# Patient Record
Sex: Female | Born: 1970
Health system: Southern US, Community
[De-identification: ages and names within clinical notes are randomized; demographics above are authoritative.]

## PROBLEM LIST (undated history)

## (undated) DIAGNOSIS — D649 Anemia, unspecified: Secondary | ICD-10-CM

## (undated) DIAGNOSIS — K219 Gastro-esophageal reflux disease without esophagitis: Secondary | ICD-10-CM

## (undated) HISTORY — PX: OTHER SURGICAL HISTORY: SHX169

## (undated) HISTORY — DX: Gastro-esophageal reflux disease without esophagitis: K21.9

---

## 2001-08-21 ENCOUNTER — Emergency Department (HOSPITAL_COMMUNITY): Admission: EM | Admit: 2001-08-21 | Discharge: 2001-08-21 | Payer: Self-pay | Admitting: Emergency Medicine

## 2003-04-07 ENCOUNTER — Emergency Department (HOSPITAL_COMMUNITY): Admission: EM | Admit: 2003-04-07 | Discharge: 2003-04-07 | Payer: Self-pay | Admitting: Emergency Medicine

## 2011-07-03 ENCOUNTER — Other Ambulatory Visit: Payer: Self-pay | Admitting: Obstetrics

## 2013-02-12 ENCOUNTER — Encounter: Payer: Self-pay | Admitting: Obstetrics

## 2014-03-03 ENCOUNTER — Telehealth: Payer: Self-pay | Admitting: *Deleted
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
## 2014-03-04 NOTE — Telephone Encounter (Signed)
I appreciate the request but i cannot due to my limited availablity Thanks for asking

## 2014-03-04 NOTE — Telephone Encounter (Signed)
Tried to contact pt, was unsuccessful, will try again at a later time.

## 2014-03-05 NOTE — Telephone Encounter (Signed)
Unable to contact pt. 

## 2014-03-05 NOTE — Telephone Encounter (Signed)
Pt.notified

## 2014-03-09 ENCOUNTER — Ambulatory Visit (INDEPENDENT_AMBULATORY_CARE_PROVIDER_SITE_OTHER)
Admission: RE | Admit: 2014-03-09 | Discharge: 2014-03-09 | Disposition: A | Discharge status: Home / Self Care | Payer: BC Managed Care – PPO | Source: Ambulatory Visit | Attending: Internal Medicine | Admitting: Internal Medicine

## 2014-03-09 ENCOUNTER — Other Ambulatory Visit: Payer: Self-pay | Admitting: Internal Medicine

## 2014-03-09 ENCOUNTER — Encounter: Payer: Self-pay | Admitting: Internal Medicine

## 2014-03-09 ENCOUNTER — Ambulatory Visit (INDEPENDENT_AMBULATORY_CARE_PROVIDER_SITE_OTHER): Payer: BC Managed Care – PPO | Admitting: Internal Medicine

## 2014-03-09 VITALS — BP 104/76 | HR 63 | Temp 98.1°F | Resp 12 | Wt 143.8 lb

## 2014-03-09 DIAGNOSIS — M546 Pain in thoracic spine: Secondary | ICD-10-CM

## 2014-03-09 DIAGNOSIS — K219 Gastro-esophageal reflux disease without esophagitis: Secondary | ICD-10-CM | POA: Insufficient documentation

## 2014-03-09 NOTE — Progress Notes (Signed)
Pre visit review using our clinic review tool, if applicable. No additional management support is needed unless otherwise documented below in the visit note. 
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
## 2014-03-09 NOTE — Patient Instructions (Signed)
The Physical Therapy referral will be scheduled and you'll be notified of the time.

## 2014-06-26 ENCOUNTER — Other Ambulatory Visit (INDEPENDENT_AMBULATORY_CARE_PROVIDER_SITE_OTHER): Payer: BC Managed Care – PPO

## 2014-06-26 ENCOUNTER — Ambulatory Visit (INDEPENDENT_AMBULATORY_CARE_PROVIDER_SITE_OTHER): Payer: BC Managed Care – PPO | Admitting: Internal Medicine

## 2014-06-26 ENCOUNTER — Encounter: Payer: Self-pay | Admitting: Internal Medicine

## 2014-06-26 VITALS — BP 118/82 | HR 73 | Temp 98.4°F | Resp 14 | Ht 63.0 in | Wt 147.0 lb

## 2014-06-26 DIAGNOSIS — K219 Gastro-esophageal reflux disease without esophagitis: Secondary | ICD-10-CM | POA: Diagnosis not present

## 2014-06-26 DIAGNOSIS — Z0000 Encounter for general adult medical examination without abnormal findings: Secondary | ICD-10-CM

## 2014-06-26 LAB — LIPID PANEL
CHOLESTEROL: 208 mg/dL — AB (ref 0–200)
HDL: 67.1 mg/dL (ref >39.00)
LDL Cholesterol: 123 mg/dL — ABNORMAL HIGH (ref 0–99)
NonHDL: 140.9
Total CHOL/HDL Ratio: 3
Triglycerides: 88 mg/dL (ref 0.0–149.0)
VLDL: 17.6 mg/dL (ref 0.0–40.0)

## 2014-06-26 LAB — BASIC METABOLIC PANEL
BUN: 12 mg/dL (ref 6–23)
CALCIUM: 8.9 mg/dL (ref 8.4–10.5)
CO2: 26 meq/L (ref 19–32)
Chloride: 105 mEq/L (ref 96–112)
Creatinine, Ser: 0.9 mg/dL (ref 0.4–1.2)
GFR: 87.51 mL/min (ref >60.00)
GLUCOSE: 77 mg/dL (ref 70–99)
Potassium: 4.1 mEq/L (ref 3.5–5.1)
Sodium: 135 mEq/L (ref 135–145)

## 2014-06-26 MED ORDER — PANTOPRAZOLE SODIUM 40 MG PO TBEC: 40.0000 mg | DELAYED_RELEASE_TABLET | Freq: Every day | ORAL | Status: DC

## 2014-06-26 NOTE — Patient Instructions (Signed)
We will check your cholesterol today.   We want you to start taking an acid reflux medicine again once a day for about 6 weeks to see if the cough is coming from the acid reflux.  Gastroesophageal Reflux Disease, Adult Gastroesophageal reflux disease (GERD) happens when acid from your stomach flows up into the esophagus. When acid comes in contact with the esophagus, the acid causes soreness (inflammation) in the esophagus. Over time, GERD may create small holes (ulcers) in the lining of the esophagus. CAUSES   Increased body weight. This puts pressure on the stomach, making acid rise from the stomach into the esophagus.  Smoking. This increases acid production in the stomach.  Drinking alcohol. This causes decreased pressure in the lower esophageal sphincter (valve or ring of muscle between the esophagus and stomach), allowing acid from the stomach into the esophagus.  Late evening meals and a full stomach. This increases pressure and acid production in the stomach.  A malformed lower esophageal sphincter. Sometimes, no cause is found. SYMPTOMS   Burning pain in the lower part of the mid-chest behind the breastbone and in the mid-stomach area. This may occur twice a week or more often.  Trouble swallowing.  Sore throat.  Dry cough.  Asthma-like symptoms including chest tightness, shortness of breath, or wheezing. DIAGNOSIS  Your caregiver may be able to diagnose GERD based on your symptoms. In some cases, X-rays and other tests may be done to check for complications or to check the condition of your stomach and esophagus. TREATMENT  Your caregiver may recommend over-the-counter or prescription medicines to help decrease acid production. Ask your caregiver before starting or adding any new medicines.  HOME CARE INSTRUCTIONS   Change the factors that you can control. Ask your caregiver for guidance concerning weight loss, quitting smoking, and alcohol consumption.  Avoid foods and  drinks that make your symptoms worse, such as:  Caffeine or alcoholic drinks.  Chocolate.  Peppermint or mint flavorings.  Garlic and onions.  Spicy foods.  Citrus fruits, such as oranges, lemons, or limes.  Tomato-based foods such as sauce, chili, salsa, and pizza.  Fried and fatty foods.  Avoid lying down for the 3 hours prior to your bedtime or prior to taking a nap.  Eat small, frequent meals instead of large meals.  Wear loose-fitting clothing. Do not wear anything tight around your waist that causes pressure on your stomach.  Raise the head of your bed 6 to 8 inches with wood blocks to help you sleep. Extra pillows will not help.  Only take over-the-counter or prescription medicines for pain, discomfort, or fever as directed by your caregiver.  Do not take aspirin, ibuprofen, or other nonsteroidal anti-inflammatory drugs (NSAIDs). SEEK IMMEDIATE MEDICAL CARE IF:   You have pain in your arms, neck, jaw, teeth, or back.  Your pain increases or changes in intensity or duration.  You develop nausea, vomiting, or sweating (diaphoresis).  You develop shortness of breath, or you faint.  Your vomit is green, yellow, black, or looks like coffee grounds or blood.  Your stool is red, bloody, or black. These symptoms could be signs of other problems, such as heart disease, gastric bleeding, or esophageal bleeding. MAKE SURE YOU:   Understand these instructions.  Will watch your condition.  Will get help right away if you are not doing well or get worse. Document Released: 06/21/2005 Document Revised: 12/04/2011 Document Reviewed: 03/31/2011 [facility] Patient Information 2015 [place], [place]. This information is not intended to replace  advice given to you by your health care provider. Make sure you discuss any questions you have with your health care provider.  

## 2014-06-26 NOTE — Assessment & Plan Note (Signed)
Likely cause of her cough and will restart protonix.

## 2014-06-26 NOTE — Progress Notes (Signed)
Pre visit review using our clinic review tool, if applicable. No additional management support is needed unless otherwise documented below in the visit note. 

## 2014-06-26 NOTE — Assessment & Plan Note (Signed)
Spoke with her about not starting smoking, continuing to exercise, she declines the flu shot today, PAP smear up to date.

## 2014-06-26 NOTE — Progress Notes (Signed)
   Subjective:    Patient ID: Ebony Allen, female    DOB: 11-06-1970, 43 y.o.   MRN: [id number]  HPI The patient is a 43 YO female who is coming in today to establish care. She has PMH of GERD. She does take birth control. PMH, allergies, medications, history reviewed and updated. She is having a dry cough right now and wants to know if it may be cancer. No [place] of cancer except lung (in smoker) and prostate. She used to be on acid reflux medicine and tries to use diet to control it now. Denies chest pain, SOB, abdominal pain. She works out regularly. She does have some stress from work but nothing excessive.  Review of Systems  Constitutional: Negative for fever, activity change, appetite change and fatigue.  HENT: Negative.   Eyes: Negative.   Respiratory: Positive for cough. Negative for chest tightness, shortness of breath and wheezing.   Cardiovascular: Negative for chest pain, palpitations and leg swelling.  Gastrointestinal: Negative for abdominal pain, diarrhea, constipation and abdominal distention.  Genitourinary: Negative.   Musculoskeletal: Negative.   Skin: Negative.   Neurological: Negative.       Objective:   Physical Exam  Vitals reviewed. Constitutional: She is oriented to person, place, and time. She appears well-developed and well-nourished.  HENT:  Head: Normocephalic and atraumatic.  Eyes: EOM are normal.  Neck: Normal range of motion.  Cardiovascular: Normal rate and regular rhythm.   Pulmonary/Chest: Effort normal and breath sounds normal. No respiratory distress. She has no wheezes. She has no rales.  Abdominal: Soft. Bowel sounds are normal. She exhibits no distension. There is no tenderness. There is no rebound.  Neurological: She is alert and oriented to person, place, and time. Coordination normal.  Skin: Skin is warm and dry.   Filed Vitals:   06/26/14 1421  BP: 118/82  Pulse: 73  Temp: 98.4 F (36.9 C)  TempSrc: Oral  Resp: 14  Height: 5\' 3"  (1.6  m)  Weight: 147 lb (66.679 kg)  SpO2: 92%      Assessment & Plan:

## 2014-09-30 ENCOUNTER — Telehealth: Payer: Self-pay | Admitting: Internal Medicine

## 2014-09-30 NOTE — Telephone Encounter (Signed)
Rec'd from [facility] forward 5 pages to Dr. Doug Sou

## 2014-10-27 ENCOUNTER — Other Ambulatory Visit (HOSPITAL_COMMUNITY): Payer: Self-pay | Admitting: Obstetrics and Gynecology

## 2014-10-27 NOTE — H&P (Signed)
Ebony Allen is a 44 y.o. female P 0-0-1-0 for myomectomy because of menorrhagia, symptomatic uterine fibroids and severe anemia. For many years the patient has had heavy menstrual periods controlled by birth control pills.  For the past year,  however,  her menstrual flow has worsened.  Her flow lasts for 7 days requiring her to wear #2 tampons with #2 overnight pads and change them hourly.  In addition, she will experience cramping that she rates as a 9/10 on a 10 point pain scale but it will decrease to 5/10 with Ibuprofen 800 mg. She goes on to report intermenstrual bleeding that may range from spotting to  heavy bleeding that soils her clothes.  She will experience urinary urgency, frequency and lower  back pain  but denies problems with bowel function, vaginitis symptoms or dyspareunia.  A sono-hysterogram in December 2015 showed an intra-cavitary mass in the posterior mid-uterus measuring 4.5 x 4.3 x 4.8 cm with fluid outlining the border;  uterus measured: 11.2 x 9.59 x 6.37 cm with #6 intramural fibroids:  sub-mucosal-4.3 x 4.2 x 4.3 cm; right posterior-3.4 x 2.7 x 4.4 cm, left posterior-3.5 x 4.0 x 3.3 cm, left anterior-3.9 x 3.0 x 3.3 cm,  left anterior-2.8 x 2.5 x 2.6 cm and posterior left-2.6 x 2.4 x 2.4 cm.  Patient's hemoglobin/hematocrit at that same time was 6.4/20.0. For the past 3 months the patient had Ayestin 5 mg added to her birth control pill regimen with very little change in flow pattern and only a small decrease in flow  volume.  A review of both medical and surgical management options were given to the patient however, she wants to proceed with surgical managment of her fibroids.   In considering surgical management,  the patient opts for minimally invasive surgery and accepts the risks associated with uterine morcellation.   Past Medical History  OB History: G: 0;  P: 0-0-1-0  GYN History: menarche: 44 YO   LMP: 08/2014  Contracepton oral contraceptives (estrogen/progesterone)  The  patient denies history of sexually transmitted disease.  Denies history of abnormal PAP smear  Last PAP smear: 2014  Medical History: Anemia and GERD   Surgical History:  2013  Esophageal Dilatation Denies problems with anesthesia or history of blood transfusions  Family History: Myasthenia Gravis and Diabetes Mellitus  Social History: Single and employed as a Professor;  Denies tobacco use and occasionally uses alcohol   Medication:  Aygestin 5 mg  daily Integra 126mg /40 mg/3 mg  daily Pantoprazole 40 mg daily prn Vitamin D daily Zinc daily  No Known Allergies but Latex causes hives   Denies sensitivity to peanuts, shellfish, soy, or adhesives.   ROS: Admits to pruritic  left neck rash, cough related to reflux, urinary urgency and frequency but   denies headache, vision changes, nasal congestion, dysphagia, tinnitus, dizziness, hoarseness, chest pain, shortness of breath, nausea, vomiting, diarrhea,constipation, dysuria, hematuria, vaginitis symptoms,  swelling of joints,easy bruising,  myalgias, arthralgias, skin rashes, unexplained weight loss and except as is mentioned in the history of present illness, patient's review of systems is otherwise negative.  Physical Exam  Bp: 92/62      P: 64  R: 20  Temperature: 98.6 degrees F. orally   Weight: 150.5   Height: 5'  3"  BMI: 26.7  Neck: supple without masses or thyromegaly Lungs: clear to auscultation Heart: regular rate and rhythm Abdomen: firm mass from pelvis to 3 fingers above symphysis pubis;  non-tender and no organomegaly Pelvic:EGBUS- wnl;  vagina-normal rugae; uterus-enlarged to 12-14 weeks size, irregular, cervix without lesions or motion tenderness; adnexae-no tenderness or masses Extremities:  no clubbing, cyanosis or edema   Assesment: Symptomatic Uterine Fibroids                       Menorrhagia            History of Severe Anemia            Dysmenorrhea   Disposition:  Robotically-assisted myomectomy with  fibroid morcellation was reviewed with the patient along with hysteroscopic myomectomy using myosure, dilatation and curettage:  Benefits of the robotic approach include lesser postoperative pain, less blood loss during surgery, reduced risk of injury to other organs due to better visualization with a 3-D HD 10 times magnifying camera, shorter hospital stay between 0-1 night and rapid recovery with return to daily routine in 2-3 weeks. Although robotically-assisted procedures have a longer operative time than traditional laparotomy, in a patient with good medical history, the benefits usually outweigh the risks.   Risks include anesthetic complications, bleeding, infection, injury to other organs, need for an open abdominal incision, need for C-section should there be a breach in the endometrial cavity, and transient post-operative facial edema.  Patient informed about FDA warning on use of morcellator dated 01/09/2013.   Discussed:  1. Incidence of post-operative diagnosis of sarcoma in women undergoing a hysterectomy is 2:1000 2. Annual incidence of leiomyosarcoma is 0.64/100,000 women 3. Sarcomas have the highest incidence in women over 65 4. Power morcellation involves risks of spreading tissue / disease. In he case of undiagnosed cancer, it may adversely affect the patient's prognosis.  A Miralax Bowel Prep was given to the patient to be completed the day before her surgery. She verbalized understanding of the risks associated with her surgery as well as pre-operative instructions and has consented to proceed with Robot Assisted Laparoscopic Myomectomy with Fibroid Morcellation, Hysteroscopic Myomectomy with Myosure, Dilatation and Curettage at [place] on November 17, 2014 at 10:30 a.m. with Dr. Cletis Media.   CSN# [id number]   Taetum Flewellen J. Florene Glen, PA-C  for Dr. Dede Query. Rivard

## 2014-10-29 ENCOUNTER — Other Ambulatory Visit: Payer: Self-pay | Admitting: Obstetrics and Gynecology

## 2014-11-03 NOTE — Addendum Note (Signed)
Addended by: Delsa Bern on: 11/03/2014 04:32 PM   Modules accepted: Orders

## 2014-11-05 NOTE — Patient Instructions (Signed)
   Your procedure is scheduled on: Tuesday, Feb 23  Enter through the [employer] of [facility] at: 9 AM Pick up the phone at the desk and dial [phone] and inform us of your arrival.  Please call this number if you have any problems the morning of surgery: [phone]  Remember: Do not eat or drink after midnight: Monday Take these medicines the morning of surgery with a SIP OF WATER:  Do not wear jewelry, make-up, or FINGER nail polish No metal in your hair or on your body. Do not wear lotions, powders, perfumes.  You may wear deodorant.  Do not bring valuables to the hospital. Contacts, dentures or bridgework may not be worn into surgery.  Patients discharged on the day of surgery will not be allowed to drive home.

## 2014-11-09 ENCOUNTER — Inpatient Hospital Stay (HOSPITAL_COMMUNITY)
Admission: RE | Admit: 2014-11-09 | Discharge: 2014-11-09 | Disposition: A | Discharge status: Home / Self Care | Payer: Self-pay | Source: Ambulatory Visit

## 2014-11-13 ENCOUNTER — Encounter (HOSPITAL_COMMUNITY)
Admission: RE | Admit: 2014-11-13 | Discharge: 2014-11-13 | Disposition: A | Discharge status: Home / Self Care | Payer: BC Managed Care – PPO | Source: Ambulatory Visit | Attending: Obstetrics and Gynecology | Admitting: Obstetrics and Gynecology

## 2014-11-13 ENCOUNTER — Telehealth: Payer: Self-pay | Admitting: Internal Medicine

## 2014-11-13 ENCOUNTER — Encounter (HOSPITAL_COMMUNITY): Payer: Self-pay

## 2014-11-13 DIAGNOSIS — Z01818 Encounter for other preprocedural examination: Secondary | ICD-10-CM | POA: Insufficient documentation

## 2014-11-13 DIAGNOSIS — D259 Leiomyoma of uterus, unspecified: Secondary | ICD-10-CM | POA: Insufficient documentation

## 2014-11-13 HISTORY — DX: Anemia, unspecified: D64.9

## 2014-11-13 LAB — BASIC METABOLIC PANEL
Anion gap: 4 — ABNORMAL LOW (ref 5–15)
BUN: 14 mg/dL (ref 6–23)
CO2: 25 mmol/L (ref 19–32)
CREATININE: 1.03 mg/dL (ref 0.50–1.10)
Calcium: 8.5 mg/dL (ref 8.4–10.5)
Chloride: 111 mmol/L (ref 96–112)
GFR, EST AFRICAN AMERICAN: 75 mL/min — AB (ref >90)
GFR, EST NON AFRICAN AMERICAN: 65 mL/min — AB (ref >90)
Glucose, Bld: 91 mg/dL (ref 70–99)
Potassium: 3.7 mmol/L (ref 3.5–5.1)
Sodium: 140 mmol/L (ref 135–145)

## 2014-11-13 LAB — CBC
HCT: 43 % (ref 36.0–46.0)
Hemoglobin: 13.3 g/dL (ref 12.0–15.0)
MCH: 25.5 pg — ABNORMAL LOW (ref 26.0–34.0)
MCHC: 30.9 g/dL (ref 30.0–36.0)
MCV: 82.5 fL (ref 78.0–100.0)
Platelets: 318 K/uL (ref 150–400)
RBC: 5.21 MIL/uL — ABNORMAL HIGH (ref 3.87–5.11)
RDW: 16.6 % — ABNORMAL HIGH (ref 11.5–15.5)
WBC: 5 K/uL (ref 4.0–10.5)

## 2014-11-13 NOTE — Telephone Encounter (Signed)
Pt called in and is requesting refill on her Protinx .  She said that med is needing a PA from [employer]

## 2014-11-13 NOTE — Patient Instructions (Addendum)
   Your procedure is scheduled on: Tuesday, Feb 23  Enter through the [employer] of [facility] at: 9 AM Pick up the phone at the desk and dial [phone] and inform us of your arrival.  Please call this number if you have any problems the morning of surgery: [phone]  Remember: Do not eat or drink after midnight: Monday Take these medicines the morning of surgery with a SIP OF WATER: Protonix  Do not wear jewelry, make-up, or FINGER nail polish No metal in your hair or on your body. Do not wear lotions, powders, perfumes.  You may wear deodorant.  Do not bring valuables to the hospital. Contacts, dentures or bridgework may not be worn into surgery.    Patients discharged on the day of surgery will not be allowed to drive home.  Home with mother Ebony Allen cell [phone]

## 2014-11-16 ENCOUNTER — Other Ambulatory Visit (HOSPITAL_COMMUNITY): Payer: Self-pay | Admitting: Obstetrics and Gynecology

## 2014-11-16 MED ORDER — DEXTROSE 5 % IV SOLN
2.0000 g | INTRAVENOUS | Status: AC
  Administered 2014-11-17: 2 g via INTRAVENOUS
  Filled 2014-11-16: qty 2

## 2014-11-16 MED ORDER — MISOPROSTOL 200 MCG PO TABS
400.0000 ug | ORAL_TABLET | Freq: Once | ORAL | Status: DC
Start: 2014-11-17 — End: 2016-09-11
  Filled 2014-11-16: qty 2

## 2014-11-16 NOTE — Anesthesia Preprocedure Evaluation (Addendum)
Anesthesia Evaluation  Patient identified by MRN, date of birth, ID band Patient awake    Reviewed: Allergy & Precautions, NPO status , Patient's Chart, lab work & pertinent test results  History of Anesthesia Complications Negative for: history of anesthetic complications  Airway Mallampati: II  TM Distance: >3 FB Neck ROM: Full    Dental no notable dental hx. (+) Dental Advisory Given   Pulmonary neg pulmonary ROS,  breath sounds clear to auscultation  Pulmonary exam normal       Cardiovascular negative cardio ROS  Rhythm:Regular Rate:Normal     Neuro/Psych negative neurological ROS  negative psych ROS   GI/Hepatic Neg liver ROS, GERD-  Medicated and Controlled,  Endo/Other  negative endocrine ROS  Renal/GU negative Renal ROS  negative genitourinary   Musculoskeletal negative musculoskeletal ROS (+)   Abdominal   Peds negative pediatric ROS (+)  Hematology  (+) anemia ,   Anesthesia Other Findings   Reproductive/Obstetrics negative OB ROS                             Anesthesia Physical Anesthesia Plan  ASA: II  Anesthesia Plan: General   Post-op Pain Management:    Induction: Intravenous  Airway Management Planned: Oral ETT  Additional Equipment:   Intra-op Plan:   Post-operative Plan: Extubation in OR  Informed Consent: I have reviewed the patients History and Physical, chart, labs and discussed the procedure including the risks, benefits and alternatives for the proposed anesthesia with the patient or authorized representative who has indicated his/her understanding and acceptance.   Dental advisory given  Plan Discussed with: CRNA  Anesthesia Plan Comments:        Anesthesia Quick Evaluation

## 2014-11-17 ENCOUNTER — Ambulatory Visit (HOSPITAL_COMMUNITY): Payer: BC Managed Care – PPO | Admitting: Anesthesiology

## 2014-11-17 ENCOUNTER — Ambulatory Visit (HOSPITAL_COMMUNITY)
Admission: RE | Admit: 2014-11-17 | Discharge: 2014-11-17 | Disposition: A | Discharge status: Home / Self Care | Payer: BC Managed Care – PPO | Source: Ambulatory Visit | Attending: Obstetrics and Gynecology | Admitting: Obstetrics and Gynecology

## 2014-11-17 ENCOUNTER — Encounter (HOSPITAL_COMMUNITY)
Admission: RE | Disposition: A | Discharge status: Home / Self Care | Payer: Self-pay | Source: Ambulatory Visit | Attending: Obstetrics and Gynecology

## 2014-11-17 DIAGNOSIS — D251 Intramural leiomyoma of uterus: Secondary | ICD-10-CM | POA: Insufficient documentation

## 2014-11-17 DIAGNOSIS — D250 Submucous leiomyoma of uterus: Secondary | ICD-10-CM | POA: Insufficient documentation

## 2014-11-17 DIAGNOSIS — N920 Excessive and frequent menstruation with regular cycle: Secondary | ICD-10-CM | POA: Diagnosis present

## 2014-11-17 DIAGNOSIS — D649 Anemia, unspecified: Secondary | ICD-10-CM | POA: Diagnosis not present

## 2014-11-17 DIAGNOSIS — D259 Leiomyoma of uterus, unspecified: Secondary | ICD-10-CM

## 2014-11-17 DIAGNOSIS — K219 Gastro-esophageal reflux disease without esophagitis: Secondary | ICD-10-CM | POA: Insufficient documentation

## 2014-11-17 DIAGNOSIS — N946 Dysmenorrhea, unspecified: Secondary | ICD-10-CM | POA: Diagnosis not present

## 2014-11-17 DIAGNOSIS — D219 Benign neoplasm of connective and other soft tissue, unspecified: Secondary | ICD-10-CM | POA: Diagnosis present

## 2014-11-17 HISTORY — PX: DILATATION & CURETTAGE/HYSTEROSCOPY WITH MYOSURE: SHX6511

## 2014-11-17 HISTORY — PX: ROBOT ASSISTED MYOMECTOMY: SHX5142

## 2014-11-17 LAB — BASIC METABOLIC PANEL
Anion gap: 7 (ref 5–15)
BUN: 8 mg/dL (ref 6–23)
CO2: 17 mmol/L — ABNORMAL LOW (ref 19–32)
Calcium: 7.1 mg/dL — ABNORMAL LOW (ref 8.4–10.5)
Chloride: 111 mmol/L (ref 96–112)
Creatinine, Ser: 1.02 mg/dL (ref 0.50–1.10)
GFR, EST AFRICAN AMERICAN: 76 mL/min — AB (ref >90)
GFR, EST NON AFRICAN AMERICAN: 66 mL/min — AB (ref >90)
Glucose, Bld: 126 mg/dL — ABNORMAL HIGH (ref 70–99)
Potassium: 4.3 mmol/L (ref 3.5–5.1)
Sodium: 135 mmol/L (ref 135–145)

## 2014-11-17 LAB — TYPE AND SCREEN
ABO/RH(D): O POS
Antibody Screen: NEGATIVE

## 2014-11-17 LAB — PREGNANCY, URINE: PREG TEST UR: NEGATIVE

## 2014-11-17 LAB — ABO/RH: ABO/RH(D): O POS

## 2014-11-17 SURGERY — DILATATION & CURETTAGE/HYSTEROSCOPY WITH MYOSURE
Anesthesia: General | Site: Uterus

## 2014-11-17 MED ORDER — VASOPRESSIN 20 UNIT/ML IV SOLN
INTRAVENOUS | Status: AC
  Filled 2014-11-17: qty 1

## 2014-11-17 MED ORDER — MISOPROSTOL 200 MCG PO TABS
400.0000 ug | ORAL_TABLET | Freq: Once | ORAL | Status: AC
  Administered 2014-11-17: 400 ug via VAGINAL

## 2014-11-17 MED ORDER — ROPIVACAINE HCL 5 MG/ML IJ SOLN
INTRAMUSCULAR | Status: AC
  Filled 2014-11-17: qty 60

## 2014-11-17 MED ORDER — SCOPOLAMINE 1 MG/3DAYS TD PT72
1.0000 | MEDICATED_PATCH | Freq: Once | TRANSDERMAL | Status: DC
  Administered 2014-11-17: 1.5 mg via TRANSDERMAL

## 2014-11-17 MED ORDER — METHYLENE BLUE 1 % INJ SOLN
INTRAMUSCULAR | Status: AC
  Filled 2014-11-17: qty 1

## 2014-11-17 MED ORDER — SODIUM CHLORIDE 0.9 % IJ SOLN
INTRAMUSCULAR | Status: AC
  Filled 2014-11-17: qty 100

## 2014-11-17 MED ORDER — DEXAMETHASONE SODIUM PHOSPHATE 10 MG/ML IJ SOLN
INTRAMUSCULAR | Status: AC
  Filled 2014-11-17: qty 1

## 2014-11-17 MED ORDER — MIDAZOLAM HCL 2 MG/2ML IJ SOLN
INTRAMUSCULAR | Status: DC | PRN
  Administered 2014-11-17: 0.5 mg via INTRAVENOUS
  Administered 2014-11-17: 1.5 mg via INTRAVENOUS

## 2014-11-17 MED ORDER — LIDOCAINE HCL (CARDIAC) 20 MG/ML IV SOLN
INTRAVENOUS | Status: AC
Start: 2014-11-17 — End: 2014-11-17
  Filled 2014-11-17: qty 5

## 2014-11-17 MED ORDER — ACETAMINOPHEN 10 MG/ML IV SOLN
1000.0000 mg | Freq: Once | INTRAVENOUS | Status: AC
  Administered 2014-11-17: 1000 mg via INTRAVENOUS
  Filled 2014-11-17 (×2): qty 100

## 2014-11-17 MED ORDER — SCOPOLAMINE 1 MG/3DAYS TD PT72
MEDICATED_PATCH | TRANSDERMAL | Status: AC
  Filled 2014-11-17: qty 1

## 2014-11-17 MED ORDER — LACTATED RINGERS IV SOLN
INTRAVENOUS | Status: DC
  Administered 2014-11-17: 09:00:00 via INTRAVENOUS

## 2014-11-17 MED ORDER — PROPOFOL 10 MG/ML IV BOLUS
INTRAVENOUS | Status: DC | PRN
  Administered 2014-11-17: 150 mg via INTRAVENOUS

## 2014-11-17 MED ORDER — IBUPROFEN 600 MG PO TABS
600.0000 mg | ORAL_TABLET | Freq: Four times a day (QID) | ORAL | Status: DC | PRN
  Administered 2014-11-17: 600 mg via ORAL
  Filled 2014-11-17: qty 1

## 2014-11-17 MED ORDER — LACTATED RINGERS IR SOLN
Status: DC | PRN
  Administered 2014-11-17: 3000 mL

## 2014-11-17 MED ORDER — KETOROLAC TROMETHAMINE 30 MG/ML IJ SOLN
INTRAMUSCULAR | Status: AC
  Filled 2014-11-17: qty 1

## 2014-11-17 MED ORDER — PROPOFOL 10 MG/ML IV BOLUS
INTRAVENOUS | Status: AC
  Filled 2014-11-17: qty 20

## 2014-11-17 MED ORDER — BUPIVACAINE HCL (PF) 0.25 % IJ SOLN
INTRAMUSCULAR | Status: AC
  Filled 2014-11-17: qty 30

## 2014-11-17 MED ORDER — FENTANYL CITRATE 0.05 MG/ML IJ SOLN
INTRAMUSCULAR | Status: DC | PRN
  Administered 2014-11-17 (×6): 50 ug via INTRAVENOUS

## 2014-11-17 MED ORDER — OXYCODONE-ACETAMINOPHEN 5-325 MG PO TABS
1.0000 | ORAL_TABLET | ORAL | Status: DC | PRN
  Administered 2014-11-17: 1 via ORAL
  Filled 2014-11-17: qty 1

## 2014-11-17 MED ORDER — SODIUM CHLORIDE 0.9 % IJ SOLN
INTRAMUSCULAR | Status: AC
  Filled 2014-11-17: qty 10

## 2014-11-17 MED ORDER — ROCURONIUM BROMIDE 100 MG/10ML IV SOLN
INTRAVENOUS | Status: AC
  Filled 2014-11-17: qty 1

## 2014-11-17 MED ORDER — LIDOCAINE HCL (CARDIAC) 20 MG/ML IV SOLN
INTRAVENOUS | Status: DC | PRN
  Administered 2014-11-17: 60 mg via INTRAVENOUS

## 2014-11-17 MED ORDER — IBUPROFEN 600 MG PO TABS
ORAL_TABLET | ORAL | Status: DC
Start: 2014-11-17 — End: 2017-04-05

## 2014-11-17 MED ORDER — ROCURONIUM BROMIDE 100 MG/10ML IV SOLN
INTRAVENOUS | Status: DC | PRN
  Administered 2014-11-17: 10 mg via INTRAVENOUS
  Administered 2014-11-17: 20 mg via INTRAVENOUS
  Administered 2014-11-17: 50 mg via INTRAVENOUS
  Administered 2014-11-17 (×2): 10 mg via INTRAVENOUS

## 2014-11-17 MED ORDER — ONDANSETRON HCL 4 MG/2ML IJ SOLN
INTRAMUSCULAR | Status: DC | PRN
  Administered 2014-11-17 (×2): 2 mg via INTRAVENOUS

## 2014-11-17 MED ORDER — ARTIFICIAL TEARS OP OINT
TOPICAL_OINTMENT | OPHTHALMIC | Status: AC
  Filled 2014-11-17: qty 3.5

## 2014-11-17 MED ORDER — OXYCODONE-ACETAMINOPHEN 5-325 MG PO TABS: 1.0000 | ORAL_TABLET | Freq: Four times a day (QID) | ORAL | Status: DC | PRN

## 2014-11-17 MED ORDER — ONDANSETRON HCL 4 MG PO TABS: 4.0000 mg | ORAL_TABLET | Freq: Three times a day (TID) | ORAL | Status: DC | PRN

## 2014-11-17 MED ORDER — STERILE WATER FOR IRRIGATION IR SOLN
Status: DC | PRN
  Administered 2014-11-17: 3000 mL

## 2014-11-17 MED ORDER — ONDANSETRON HCL 4 MG/2ML IJ SOLN
INTRAMUSCULAR | Status: AC
  Filled 2014-11-17: qty 2

## 2014-11-17 MED ORDER — SODIUM CHLORIDE 0.9 % IJ SOLN
INTRAMUSCULAR | Status: AC
  Filled 2014-11-17: qty 50

## 2014-11-17 MED ORDER — ARTIFICIAL TEARS OP OINT
TOPICAL_OINTMENT | OPHTHALMIC | Status: DC | PRN
  Administered 2014-11-17: 1 via OPHTHALMIC

## 2014-11-17 MED ORDER — CHLOROPROCAINE HCL 1 % IJ SOLN
INTRAMUSCULAR | Status: AC
  Filled 2014-11-17: qty 30

## 2014-11-17 MED ORDER — FENTANYL CITRATE 0.05 MG/ML IJ SOLN: 25.0000 ug | INTRAMUSCULAR | Status: DC | PRN

## 2014-11-17 MED ORDER — LACTATED RINGERS IV SOLN
INTRAVENOUS | Status: DC
  Administered 2014-11-17: 21:00:00 via INTRAVENOUS

## 2014-11-17 MED ORDER — NEOSTIGMINE METHYLSULFATE 10 MG/10ML IV SOLN
INTRAVENOUS | Status: DC | PRN
  Administered 2014-11-17: 4 mg via INTRAVENOUS

## 2014-11-17 MED ORDER — LACTATED RINGERS IV SOLN
INTRAVENOUS | Status: DC
  Administered 2014-11-17 (×2): via INTRAVENOUS

## 2014-11-17 MED ORDER — FENTANYL CITRATE 0.05 MG/ML IJ SOLN
INTRAMUSCULAR | Status: AC
  Filled 2014-11-17: qty 5

## 2014-11-17 MED ORDER — MENTHOL 3 MG MT LOZG: 1.0000 | LOZENGE | OROMUCOSAL | Status: DC | PRN

## 2014-11-17 MED ORDER — FENTANYL CITRATE 0.05 MG/ML IJ SOLN
INTRAMUSCULAR | Status: AC
  Filled 2014-11-17: qty 2

## 2014-11-17 MED ORDER — ONDANSETRON HCL 4 MG/2ML IJ SOLN: 4.0000 mg | Freq: Once | INTRAMUSCULAR | Status: DC | PRN

## 2014-11-17 MED ORDER — HYDROMORPHONE HCL 1 MG/ML IJ SOLN
INTRAMUSCULAR | Status: DC | PRN
  Administered 2014-11-17: 1 mg via INTRAVENOUS

## 2014-11-17 MED ORDER — HYDROMORPHONE HCL 1 MG/ML IJ SOLN
INTRAMUSCULAR | Status: AC
  Filled 2014-11-17: qty 1

## 2014-11-17 MED ORDER — SODIUM CHLORIDE 0.9 % IV SOLN
INTRAVENOUS | Status: DC | PRN
  Administered 2014-11-17: 90 mL

## 2014-11-17 MED ORDER — MIDAZOLAM HCL 2 MG/2ML IJ SOLN
INTRAMUSCULAR | Status: AC
  Filled 2014-11-17: qty 2

## 2014-11-17 MED ORDER — DEXAMETHASONE SODIUM PHOSPHATE 10 MG/ML IJ SOLN
INTRAMUSCULAR | Status: DC | PRN
  Administered 2014-11-17: 10 mg via INTRAVENOUS

## 2014-11-17 MED ORDER — VASOPRESSIN 20 UNIT/ML IV SOLN
INTRAVENOUS | Status: DC | PRN
  Administered 2014-11-17: 50 mL via INTRAMUSCULAR
  Administered 2014-11-17: 55 mL via INTRAMUSCULAR

## 2014-11-17 MED ORDER — ACETAMINOPHEN 10 MG/ML IV SOLN
1000.0000 mg | Freq: Once | INTRAVENOUS | Status: AC
  Administered 2014-11-17: 1000 mg via INTRAVENOUS
  Filled 2014-11-17: qty 100

## 2014-11-17 MED ORDER — GLYCOPYRROLATE 0.2 MG/ML IJ SOLN
INTRAMUSCULAR | Status: DC | PRN
  Administered 2014-11-17: .6 mg via INTRAVENOUS
  Administered 2014-11-17 (×2): 0.1 mg via INTRAVENOUS

## 2014-11-17 MED ORDER — NEOSTIGMINE METHYLSULFATE 10 MG/10ML IV SOLN
INTRAVENOUS | Status: AC
  Filled 2014-11-17: qty 1

## 2014-11-17 MED ORDER — GLYCOPYRROLATE 0.2 MG/ML IJ SOLN
INTRAMUSCULAR | Status: AC
  Filled 2014-11-17: qty 3

## 2014-11-17 SURGICAL SUPPLY — 68 items
BARRIER ADHS 3X4 INTERCEED (GAUZE/BANDAGES/DRESSINGS) ×4 IMPLANT
BLADE LAP MORCELLATOR 15X9.5 (ELECTROSURGICAL) ×1 IMPLANT
BOOTIES KNEE HIGH SLOAN (MISCELLANEOUS) ×5 IMPLANT
BRR ADH 4X3 ABS CNTRL BYND (GAUZE/BANDAGES/DRESSINGS) ×4
CANISTER SUCT 3000ML (MISCELLANEOUS) ×6 IMPLANT
CATH FOLEY LATEX FREE 14FR (CATHETERS) ×3
CATH FOLEY LF 14FR (CATHETERS) IMPLANT
CHLORAPREP W/TINT 26ML (MISCELLANEOUS) ×3 IMPLANT
CLOTH BEACON ORANGE TIMEOUT ST (SAFETY) ×3 IMPLANT
CONT PATH 16OZ SNAP LID 3702 (MISCELLANEOUS) ×3 IMPLANT
CONTAINER PREFILL 10% NBF 60ML (FORM) ×5 IMPLANT
COVER BACK TABLE 60X90IN (DRAPES) ×6 IMPLANT
COVER TIP SHEARS 8 DVNC (MISCELLANEOUS) ×2 IMPLANT
COVER TIP SHEARS 8MM DA VINCI (MISCELLANEOUS) ×1
DECANTER SPIKE VIAL GLASS SM (MISCELLANEOUS) ×10 IMPLANT
DRAPE WARM FLUID 44X44 (DRAPE) ×3 IMPLANT
DRSG COVADERM PLUS 2X2 (GAUZE/BANDAGES/DRESSINGS) ×12 IMPLANT
DRSG OPSITE POSTOP 3X4 (GAUZE/BANDAGES/DRESSINGS) ×3 IMPLANT
ELECT REM PT RETURN 9FT ADLT (ELECTROSURGICAL) ×3
ELECTRODE REM PT RTRN 9FT ADLT (ELECTROSURGICAL) ×2 IMPLANT
GLOVE BIOGEL PI IND STRL 7.0 (GLOVE) ×4 IMPLANT
GLOVE BIOGEL PI INDICATOR 7.0 (GLOVE) ×8
GLOVE ECLIPSE 6.5 STRL STRAW (GLOVE) ×10 IMPLANT
GLOVE ECLIPSE 7.0 STRL STRAW (GLOVE) ×1 IMPLANT
GLOVE SURG SS PI 7.0 STRL IVOR (GLOVE) ×1 IMPLANT
GOWN STRL REUS W/TWL LRG LVL3 (GOWN DISPOSABLE) ×6 IMPLANT
KIT ACCESSORY DA VINCI DISP (KITS) ×1
KIT ACCESSORY DVNC DISP (KITS) ×2 IMPLANT
LIQUID BAND (GAUZE/BANDAGES/DRESSINGS) ×3 IMPLANT
MYOSURE XL FIBROID REM (MISCELLANEOUS) ×6
NDL SPNL 22GX7 QUINCKE BK (NEEDLE) IMPLANT
NEEDLE HYPO 22GX1.5 SAFETY (NEEDLE) ×3 IMPLANT
NEEDLE SPNL 22GX7 QUINCKE BK (NEEDLE) ×3 IMPLANT
NS IRRIG 1000ML POUR BTL (IV SOLUTION) ×6 IMPLANT
PACK ROBOT WH (CUSTOM PROCEDURE TRAY) ×3 IMPLANT
PACK ROBOTIC GOWN (GOWN DISPOSABLE) ×3 IMPLANT
PACK VAGINAL MINOR WOMEN LF (CUSTOM PROCEDURE TRAY) ×2 IMPLANT
PAD OB MATERNITY 4.3X12.25 (PERSONAL CARE ITEMS) ×3 IMPLANT
PAD POSITIONER PINK NONSTERILE (MISCELLANEOUS) ×3 IMPLANT
PORT ACCESS TROCAR AIRSEAL 12 (TROCAR) ×2 IMPLANT
PORT ACCESS TROCAR AIRSEAL 5M (TROCAR) ×1
SET BI-LUMEN FLTR TB AIRSEAL (TUBING) ×1 IMPLANT
SET CYSTO W/LG BORE CLAMP LF (SET/KITS/TRAYS/PACK) IMPLANT
SET IRRIG TUBING LAPAROSCOPIC (IRRIGATION / IRRIGATOR) ×3 IMPLANT
SET TRI-LUMEN FLTR TB AIRSEAL (TUBING) ×1 IMPLANT
SUT MNCRL AB 3-0 PS2 27 (SUTURE) ×6 IMPLANT
SUT VIC AB 0 CT1 27 (SUTURE)
SUT VIC AB 0 CT1 27XBRD ANTBC (SUTURE) IMPLANT
SUT VICRYL 0 UR6 27IN ABS (SUTURE) ×6 IMPLANT
SUT VLOC 180 0 6IN GS21 (SUTURE) ×1 IMPLANT
SUT VLOC 180 0 9IN  GS21 (SUTURE) ×4
SUT VLOC 180 0 9IN GS21 (SUTURE) ×2 IMPLANT
SUT VLOC 180 2-0 6IN GS21 (SUTURE) ×4 IMPLANT
SUT VLOC 180 2-0 9IN GS21 (SUTURE) ×6 IMPLANT
SYR 50ML LL SCALE MARK (SYRINGE) ×4 IMPLANT
SYSTEM CONVERTIBLE TROCAR (TROCAR) ×1 IMPLANT
SYSTEM TISS REMOVAL MYSR XL RM (MISCELLANEOUS) IMPLANT
TIP UTERINE 6.7X8CM BLUE DISP (MISCELLANEOUS) ×1 IMPLANT
TOWEL OR 17X24 6PK STRL BLUE (TOWEL DISPOSABLE) ×9 IMPLANT
TRAY FOLEY BAG SILVER LF 16FR (CATHETERS) ×3 IMPLANT
TROCAR 12M 150ML BLUNT (TROCAR) ×1 IMPLANT
TROCAR OPTI TIP 12M 100M (ENDOMECHANICALS) ×1 IMPLANT
TROCAR PORT AIRSEAL 5X120 (TROCAR) IMPLANT
TROCAR PORT AIRSEAL 8X120 (TROCAR) ×1 IMPLANT
TROCAR XCEL 12X100 BLDLESS (ENDOMECHANICALS) ×3 IMPLANT
TUBING AQUILEX INFLOW (TUBING) ×3 IMPLANT
TUBING AQUILEX OUTFLOW (TUBING) ×3 IMPLANT
WATER STERILE IRR 1000ML POUR (IV SOLUTION) ×5 IMPLANT

## 2014-11-17 NOTE — Interval H&P Note (Signed)
History and Physical Interval Note:  11/17/2014 10:30 AM  Ebony Allen  has presented today for surgery, with the diagnosis of Symtomatic Fibroids  The various methods of treatment have been discussed with the patient and family. After consideration of risks, benefits and other options for treatment, the patient has consented to  Procedure(s): [place] (N/A) ROBOTIC ASSISTED MYOMECTOMY (N/A) as a surgical intervention .  The patient's history has been reviewed, patient examined, no change in status, stable for surgery.  I have reviewed the patient's chart and labs.  Questions were answered to the patient's satisfaction.     Marbin Olshefski A

## 2014-11-17 NOTE — Progress Notes (Signed)
Discharge instructions reviewed and prescription medications. Patient expresses understanding. No further questions at this time, patient wheeled downstairs by nurse tech to vehicle and discharged home

## 2014-11-17 NOTE — Discharge Summary (Signed)
Physician Discharge Summary  Patient ID: Ebony Allen MRN: [id number] DOB/AGE: August 06, 1971 44 y.o.  Admit date: 11/17/2014 Discharge date: 11/17/2014   Discharge Diagnoses:  Symptomatic Uterine Fibroids Active Problems:   * No active hospital problems. *   Operation: Hysteroscopic Myomectomy with Myosure, Robot Assisted Laparoscopic Myomectomy with Morcellation   Discharged Condition: Good  Hospital Course: On the date of admission the patient underwent the aforementioned procedures and tolerated them well.  Post operative course was unremarkable with the patient resuming bowel and bladder function on the evening following her surgery and was therefore deemed ready for discharge home.  Disposition: Home to self care  Discharge Medications:    Medication List    STOP taking these medications        norethindrone 5 MG tablet  Commonly known as:  AYGESTIN      TAKE these medications        FISH OIL PO  Take 2 capsules by mouth daily.     HAIR/SKIN/NAILS PO  Take 1 tablet by mouth daily.     ibuprofen 600 MG tablet  Commonly known as:  ADVIL,MOTRIN  1  po  pc with 6 ounces of water every 6 hours for 5 days then prn-pain     INTEGRA PO  Take 1 tablet by mouth daily.     ondansetron 4 MG tablet  Commonly known as:  ZOFRAN  Take 1 tablet (4 mg total) by mouth every 8 (eight) hours as needed for nausea or vomiting.     oxyCODONE-acetaminophen 5-325 MG per tablet  Commonly known as:  PERCOCET/ROXICET  Take 1-2 tablets by mouth every 6 (six) hours as needed for severe pain.     pantoprazole 40 MG tablet  Commonly known as:  PROTONIX  Take 1 tablet (40 mg total) by mouth daily.     Vitamin D 2000 UNITS Caps  Take 1 capsule by mouth daily.         Follow-up: Dr. Katharine Look A. Rivard on December 01, 2014 at 10:45 a.m.  SignedEarnstine Regal, Hershal Coria 11/17/2014, 6:36 PM

## 2014-11-17 NOTE — Anesthesia Postprocedure Evaluation (Signed)
  Anesthesia Post-op Note  Patient: Ebony Allen  Procedure(s) Performed: Procedure(s) (LRB): DILATATION & CURETTAGE/HYSTEROSCOPY WITH MYOSURE (N/A) ROBOTIC ASSISTED MYOMECTOMY (N/A)  Patient Location: PACU  Anesthesia Type: General  Level of Consciousness: awake and alert   Airway and Oxygen Therapy: Patient Spontanous Breathing  Post-op Pain: mild  Post-op Assessment: Post-op Vital signs reviewed, Patient's Cardiovascular Status Stable, Respiratory Function Stable, Patent Airway and No signs of Nausea or vomiting  Last Vitals:  Filed Vitals:   11/17/14 2127  BP: 142/69  Pulse: 65  Temp: 36.7 C  Resp: 16    Post-op Vital Signs: stable   Complications: No apparent anesthesia complications

## 2014-11-17 NOTE — Discharge Instructions (Signed)
Call [facility] @ [phone] if:  You have a temperature greater than or equal to 100.4 degrees Farenheit orally You have pain that is not made better by the pain medication given and taken as directed You have excessive bleeding or problems urinating  Take Colace (Docusate Sodium/Stool Softener) 100 mg 2-3 times daily while taking narcotic pain medicine to avoid constipation or until bowel movements are regular. Take Ibuprofen 600mg  with food and at least 6 ounces of water every 6 hours for 5 days then as needed for pain Continue daily Aygestin until your post-op appointment with Dr Cletis Media.  You may drive after 1 week You may walk up steps You may shower tomorrow  You may resume a regular diet Keep incisions clean and dry  Do not lift over 15 pounds for 6 weeks Avoid anything in vagina for 6 weeks (or until after your post-operative visit)

## 2014-11-17 NOTE — Transfer of Care (Signed)
Immediate Anesthesia Transfer of Care Note  Patient: Ebony Allen  Procedure(s) Performed: Procedure(s): DILATATION & CURETTAGE/HYSTEROSCOPY WITH MYOSURE (N/A) ROBOTIC ASSISTED MYOMECTOMY (N/A)  Patient Location: PACU  Anesthesia Type:General  Level of Consciousness: awake, alert  and oriented  Airway & Oxygen Therapy: Patient Spontanous Breathing and Patient connected to nasal cannula oxygen  Post-op Assessment: Report given to RN and Post -op Vital signs reviewed and stable  Post vital signs: Reviewed and stable  Last Vitals:  Filed Vitals:   11/17/14 0850  BP: 105/84  Temp: 36.6 C  Resp: 18    Complications: No apparent anesthesia complications

## 2014-11-17 NOTE — Op Note (Signed)
Preoperative diagnosis: Symptomatic uterine fibroids   Postoperative diagnosis: Same   Anesthesia: General   Anesthesiologist: Dr. Jillyn Hidden  Procedure: Hysteroscopy with Myosure myomectomy followed by Robotically assisted myomectomy   Surgeon: Dr. Katharine Look Ayleen Mckinstry   Assistant: Earnstine Regal P.A.-C .  Estimated blood loss: 350 cc   Water deficit: 3800 cc  Procedure:   After being informed of the planned procedure with possible complications including but not limited to bleeding, infection, injury to other organs, need for laparotomy, possible need for morcellation with risks and benefits reviewed, expected hospital stay and recovery, informed consent is obtained and patient is taken to or #7. She is placed in lithotomy position on a Pink pad with both arms padded and tucked on each side and bilateral knee-high sequential compressive devices. She is given general anesthesia with endotracheal intubation without any complication. She is prepped and draped in a sterile fashion. A  Foley catheter is inserted in her bladder.   Pelvic exam reveals: An enlarged uterus at 14 weeks, irregular in shape both mobile. Adnexa are not felt  A weighted speculum is inserted in the vagina and the anterior lip of the cervix is grasped with a tenaculum forcep. We proceed with a paracervical block and vaginal infiltration using ropivacaine 0.5% diluted 1 in 1 with saline. We then infiltrate at 12,3,6 and 9 o'clock using Vasopressin 20 Units in 100 cc of saline ( total of 4 Units given). The uterus was then sounded at 9 cm. We easily dilate the cervix using Hegar dilator to #21 which allows for easy insertion of the Myosure diagnostic hysteroscope. With perfusion of Normal saline  at a maximum pressure of 120 mmHg, we are able to evaluate the entire uterine cavity.  Observation: A large fibroid fills the entire uterine cavity and has a large 4 cm posterior base. We are able to see the left tubal ostium but not the right  one. We also note behind this fibroid a 3 cm fundal submucosal fibroid and a 2 cm submucosal fibroid, both on the right side.   We insert the Myosure device  and proceed with the resection of 80 % of the large base but had to terminate the procedure due to a water deficit of approximately 3800 cc and a significant drop in the patient's core temperature which rapidly returned to normal after termination of the excision.. We then removed our instrumentation. Using a fibroid grasper, we attempt to grab the remaining body of the fibroid but were unsuccessful.  Instruments are then removed. Instrument and sponge count is complete x2. Estimated blood loss is 200 cc. Water deficit is 3800 cc of normal saline. A #8 RUMI intrauterine manipulator is inserted in the uterine cavity. Due to active bleeding, We also insert a Foley catheter in the uterus and insufflate the balloon with 30 cc of Saline for tamponade.  Trocar placement is decided. We infiltrate 6 cm above the umbilicus with 10 cc of ropivacaine per protocol and perform a 10 mm vertical incision which is brought down bluntly to the fascia. The fascia is identified and grasped with Coker forceps. The fascia is incised with Mayo scissors. Peritoneum is entered bluntly. A pursestring suture of 0 Vicryl is placed on the fascia and a 10 mm Hassan trocar is easily inserted in the abdominal cavity held in placed with a Pursetring suture. This allows for easy insufflation of a pneumoperitoneum using warmed CO2 at a maximum pressure of 15 mm of mercury. 60 cc of Ropivacaine 0.5 % diluted  1 in 1 is sent in the pelvis and the patient is positioned in reverse Trendelenburg. We then placed two 65mm robotic trocar on the left, one 43mm robotic trocar on the right and one 12 mm Airseal patient's side assistant trocar on the right after infiltrating every site with ropivacaine per protocol. The robot is docked on the left of the patient after positioning her in Trendelenburg. A  monopolar scissor is inserted in arm #1, a PK gyrus forcep is inserted in arm #2 and a Tenaculum is inserted in arm #3.  Preparation and docking is completed in 43 minutes.   Observation: The uterus is enlarged with multiple fibroids with a posterior dominant one measuring 4 cm. Both tubes and ovaries are normal. Both anterior and posterior cul-de-sac are normal. Liver and gallbladder are  visualized and normal. Appendix is visualized and normal.  We begin on the posterior wall and after infiltrating each time with Vasopressin 20 Units/100 ml of Saline, we perform a right posterior 3 cm incision to remove a 3 cm fibroid. A left posterior 3 cm incision allows [place] to remove a 3 cm fibroid. A 5 cm right posterior fundal incision leads to the removal of 4 fibroids totaling 5 cm. All incisions are closed with 2 layers of running sutures of 0 V-Lock and a baseball suture of 2-0 V-Lock for satisfactory hemostasis. Moving to the anterior aspect of the uterus, we proceed in the same manner. A left anterior 4 cm incision allows [place] to remove a 4 cm intramural and a 1 cm intramural fibroids with entry in the uterine cavity. This allows [place] to grab the large submucosal fibroid that had been partially resected hysteroscopically and remove it. A 3.5 cm right fundal incision allows the removal of a 4 cm intramural fibroid with entry in the uterine cavity which then allows [place] to remove the last 2 submucosal fibroids measuring 2 and 3 cm. We close these 2 incisions in 2 layers of running 0 V-Lock with care not to leave suture material in the cavity and then complete the closure with baseball sutures using 2-0 V-Lock for satisfactory hemostasis. Finally a 2 cm right anterior incision lets [place] remove a 2 cm subserosal fibroid. This last incision is closed with 1 layer of running 2-0 V-Lock and a baseball suture of 2-0 V-Lock.   A total of 14 fibroids were removed with 6 incisions and 2 entries in the uterine cavity.  We irrigated  profusely to confirm a satisfactory hemostasis. A sheet of Interceed divided in 2 covers all incisions. Console  time is completed in 4 hours and 28 minutes.   All instruments are then removed and the robot is undocked.   The patient side assistant 12 mm trocar is replaced with the morcellator under direct visualization. We then proceed with systematic morcellation of the specimen which takes 15 minutes.   We irrigated profusely to again confirm a satisfactory hemostasis.   All trochars are removed under direct visualization after evacuating the pneumoperitoneum.   The fascia of the supraumbilical incision is closed with the previously placed pursestring suture of 0 Vicryl. the fascia of the right patient side assistant trocar is closed with a figure-of-eight stitch of 0 Vicryl. All incisions are then closed with subcuticular suture of 3-0 Monocryl and Dermabond.   A speculum is inserted in the vagina to confirm adequate hemostasis on the cervix and absence of heavy uterine bleeding after removal of the RUMI and the uterine foley catheter.  Instrument and  sponge count is complete x2. Estimated blood loss is 350 cc.   The procedure is well tolerated by the patient who  is taken to recovery room in a well and stable condition.   Specimen: Fibroids weighing 80 g sent to pathology.

## 2014-11-17 NOTE — H&P (View-Only) (Signed)
Ebony Allen is a 44 y.o. female P 0-0-1-0 for myomectomy because of menorrhagia, symptomatic uterine fibroids and severe anemia. For many years the patient has had heavy menstrual periods controlled by birth control pills.  For the past year,  however,  her menstrual flow has worsened.  Her flow lasts for 7 days requiring her to wear #2 tampons with #2 overnight pads and change them hourly.  In addition, she will experience cramping that she rates as a 9/10 on a 10 point pain scale but it will decrease to 5/10 with Ibuprofen 800 mg. She goes on to report intermenstrual bleeding that may range from spotting to  heavy bleeding that soils her clothes.  She will experience urinary urgency, frequency and lower  back pain  but denies problems with bowel function, vaginitis symptoms or dyspareunia.  A sono-hysterogram in December 2015 showed an intra-cavitary mass in the posterior mid-uterus measuring 4.5 x 4.3 x 4.8 cm with fluid outlining the border;  uterus measured: 11.2 x 9.59 x 6.37 cm with #6 intramural fibroids:  sub-mucosal-4.3 x 4.2 x 4.3 cm; right posterior-3.4 x 2.7 x 4.4 cm, left posterior-3.5 x 4.0 x 3.3 cm, left anterior-3.9 x 3.0 x 3.3 cm,  left anterior-2.8 x 2.5 x 2.6 cm and posterior left-2.6 x 2.4 x 2.4 cm.  Patient's hemoglobin/hematocrit at that same time was 6.4/20.0. For the past 3 months the patient had Ayestin 5 mg added to her birth control pill regimen with very little change in flow pattern and only a small decrease in flow  volume.  A review of both medical and surgical management options were given to the patient however, she wants to proceed with surgical managment of her fibroids.   In considering surgical management,  the patient opts for minimally invasive surgery and accepts the risks associated with uterine morcellation.   Past Medical History  OB History: G: 0;  P: 0-0-1-0  GYN History: menarche: 44 YO   LMP: 08/2014  Contracepton oral contraceptives (estrogen/progesterone)  The  patient denies history of sexually transmitted disease.  Denies history of abnormal PAP smear  Last PAP smear: 2014  Medical History: Anemia and GERD   Surgical History:  2013  Esophageal Dilatation Denies problems with anesthesia or history of blood transfusions  Family History: Myasthenia Gravis and Diabetes Mellitus  Social History: Single and employed as a Professor;  Denies tobacco use and occasionally uses alcohol   Medication:  Aygestin 5 mg  daily Integra 126mg /40 mg/3 mg  daily Pantoprazole 40 mg daily prn Vitamin D daily Zinc daily  No Known Allergies but Latex causes hives   Denies sensitivity to peanuts, shellfish, soy, or adhesives.   ROS: Admits to pruritic  left neck rash, cough related to reflux, urinary urgency and frequency but   denies headache, vision changes, nasal congestion, dysphagia, tinnitus, dizziness, hoarseness, chest pain, shortness of breath, nausea, vomiting, diarrhea,constipation, dysuria, hematuria, vaginitis symptoms,  swelling of joints,easy bruising,  myalgias, arthralgias, skin rashes, unexplained weight loss and except as is mentioned in the history of present illness, patient's review of systems is otherwise negative.  Physical Exam  Bp: 92/62      P: 64  R: 20  Temperature: 98.6 degrees F. orally   Weight: 150.5   Height: 5'  3"  BMI: 26.7  Neck: supple without masses or thyromegaly Lungs: clear to auscultation Heart: regular rate and rhythm Abdomen: firm mass from pelvis to 3 fingers above symphysis pubis;  non-tender and no organomegaly Pelvic:EGBUS- wnl;  vagina-normal rugae; uterus-enlarged to 12-14 weeks size, irregular, cervix without lesions or motion tenderness; adnexae-no tenderness or masses Extremities:  no clubbing, cyanosis or edema   Assesment: Symptomatic Uterine Fibroids                       Menorrhagia            History of Severe Anemia            Dysmenorrhea   Disposition:  Robotically-assisted myomectomy with  fibroid morcellation was reviewed with the patient along with hysteroscopic myomectomy using myosure, dilatation and curettage:  Benefits of the robotic approach include lesser postoperative pain, less blood loss during surgery, reduced risk of injury to other organs due to better visualization with a 3-D HD 10 times magnifying camera, shorter hospital stay between 0-1 night and rapid recovery with return to daily routine in 2-3 weeks. Although robotically-assisted procedures have a longer operative time than traditional laparotomy, in a patient with good medical history, the benefits usually outweigh the risks.   Risks include anesthetic complications, bleeding, infection, injury to other organs, need for an open abdominal incision, need for C-section should there be a breach in the endometrial cavity, and transient post-operative facial edema.  Patient informed about FDA warning on use of morcellator dated 01/09/2013.   Discussed:  1. Incidence of post-operative diagnosis of sarcoma in women undergoing a hysterectomy is 2:1000 2. Annual incidence of leiomyosarcoma is 0.64/100,000 women 3. Sarcomas have the highest incidence in women over 65 4. Power morcellation involves risks of spreading tissue / disease. In he case of undiagnosed cancer, it may adversely affect the patient's prognosis.  A Miralax Bowel Prep was given to the patient to be completed the day before her surgery. She verbalized understanding of the risks associated with her surgery as well as pre-operative instructions and has consented to proceed with Robot Assisted Laparoscopic Myomectomy with Fibroid Morcellation, Hysteroscopic Myomectomy with Myosure, Dilatation and Curettage at [facility] on November 17, 2014 at 10:30 a.m. with Dr. Cletis Media.   CSN# [id number]   Sanford Lindblad J. Florene Glen, PA-C  for Dr. Dede Query. Rivard

## 2014-11-18 ENCOUNTER — Encounter (HOSPITAL_COMMUNITY): Payer: Self-pay | Admitting: Obstetrics and Gynecology

## 2014-12-03 ENCOUNTER — Encounter (HOSPITAL_COMMUNITY): Payer: Self-pay | Admitting: *Deleted

## 2014-12-03 ENCOUNTER — Inpatient Hospital Stay (HOSPITAL_COMMUNITY): Payer: BC Managed Care – PPO

## 2014-12-03 ENCOUNTER — Observation Stay (HOSPITAL_COMMUNITY)
Admission: AD | Admit: 2014-12-03 | Discharge: 2014-12-03 | Disposition: A | Discharge status: Home / Self Care | Payer: BC Managed Care – PPO | Source: Ambulatory Visit | Attending: Obstetrics and Gynecology | Admitting: Obstetrics and Gynecology

## 2014-12-03 DIAGNOSIS — R1110 Vomiting, unspecified: Secondary | ICD-10-CM | POA: Diagnosis not present

## 2014-12-03 DIAGNOSIS — G8918 Other acute postprocedural pain: Principal | ICD-10-CM | POA: Insufficient documentation

## 2014-12-03 DIAGNOSIS — K219 Gastro-esophageal reflux disease without esophagitis: Secondary | ICD-10-CM | POA: Diagnosis not present

## 2014-12-03 DIAGNOSIS — D649 Anemia, unspecified: Secondary | ICD-10-CM | POA: Insufficient documentation

## 2014-12-03 DIAGNOSIS — R109 Unspecified abdominal pain: Secondary | ICD-10-CM | POA: Diagnosis present

## 2014-12-03 DIAGNOSIS — Z9889 Other specified postprocedural states: Secondary | ICD-10-CM

## 2014-12-03 DIAGNOSIS — Z91040 Latex allergy status: Secondary | ICD-10-CM | POA: Diagnosis present

## 2014-12-03 DIAGNOSIS — K5900 Constipation, unspecified: Secondary | ICD-10-CM | POA: Diagnosis present

## 2014-12-03 LAB — URINALYSIS, DIPSTICK ONLY
Bilirubin Urine: NEGATIVE
Glucose, UA: NEGATIVE mg/dL
KETONES UR: 15 mg/dL — AB
Leukocytes, UA: NEGATIVE
NITRITE: NEGATIVE
Protein, ur: 30 mg/dL — AB
UROBILINOGEN UA: 0.2 mg/dL (ref 0.0–1.0)
pH: 6 (ref 5.0–8.0)

## 2014-12-03 LAB — CBC WITH DIFFERENTIAL/PLATELET
BASOS PCT: 0 % (ref 0–1)
Basophils Absolute: 0 10*3/uL (ref 0.0–0.1)
EOS ABS: 0.1 10*3/uL (ref 0.0–0.7)
Eosinophils Relative: 1 % (ref 0–5)
HCT: 49.4 % — ABNORMAL HIGH (ref 36.0–46.0)
Hemoglobin: 15.8 g/dL — ABNORMAL HIGH (ref 12.0–15.0)
LYMPHS ABS: 2 10*3/uL (ref 0.7–4.0)
Lymphocytes Relative: 14 % (ref 12–46)
MCH: 26.2 pg (ref 26.0–34.0)
MCHC: 32 g/dL (ref 30.0–36.0)
MCV: 82.1 fL (ref 78.0–100.0)
Monocytes Absolute: 0.5 10*3/uL (ref 0.1–1.0)
Monocytes Relative: 4 % (ref 3–12)
Neutro Abs: 11.1 10*3/uL — ABNORMAL HIGH (ref 1.7–7.7)
Neutrophils Relative %: 81 % — ABNORMAL HIGH (ref 43–77)
Platelets: 471 10*3/uL — ABNORMAL HIGH (ref 150–400)
RBC: 6.02 MIL/uL — ABNORMAL HIGH (ref 3.87–5.11)
RDW: 17.6 % — ABNORMAL HIGH (ref 11.5–15.5)
WBC: 13.8 10*3/uL — ABNORMAL HIGH (ref 4.0–10.5)

## 2014-12-03 LAB — COMPREHENSIVE METABOLIC PANEL
ALT: 20 U/L (ref 0–35)
AST: 26 U/L (ref 0–37)
Albumin: 4.6 g/dL (ref 3.5–5.2)
Alkaline Phosphatase: 69 U/L (ref 39–117)
Anion gap: 13 (ref 5–15)
BILIRUBIN TOTAL: 0.9 mg/dL (ref 0.3–1.2)
BUN: 20 mg/dL (ref 6–23)
CALCIUM: 10.6 mg/dL — AB (ref 8.4–10.5)
CO2: 23 mmol/L (ref 19–32)
Chloride: 103 mmol/L (ref 96–112)
Creatinine, Ser: 1 mg/dL (ref 0.50–1.10)
GFR calc Af Amer: 78 mL/min — ABNORMAL LOW (ref >90)
GFR calc non Af Amer: 67 mL/min — ABNORMAL LOW (ref >90)
Glucose, Bld: 131 mg/dL — ABNORMAL HIGH (ref 70–99)
Potassium: 4.3 mmol/L (ref 3.5–5.1)
Sodium: 139 mmol/L (ref 135–145)
Total Protein: 8.8 g/dL — ABNORMAL HIGH (ref 6.0–8.3)

## 2014-12-03 MED ORDER — LACTATED RINGERS IV BOLUS (SEPSIS)
500.0000 mL | Freq: Once | INTRAVENOUS | Status: AC
  Administered 2014-12-03: 500 mL via INTRAVENOUS

## 2014-12-03 MED ORDER — KETOROLAC TROMETHAMINE 30 MG/ML IJ SOLN
30.0000 mg | Freq: Four times a day (QID) | INTRAMUSCULAR | Status: DC | PRN
  Administered 2014-12-03: 30 mg via INTRAVENOUS
  Filled 2014-12-03: qty 1

## 2014-12-03 MED ORDER — LACTATED RINGERS IV SOLN
INTRAVENOUS | Status: DC
  Administered 2014-12-03: 11:00:00 via INTRAVENOUS

## 2014-12-03 MED ORDER — HYDROMORPHONE HCL 1 MG/ML IJ SOLN: 2.0000 mg | INTRAMUSCULAR | Status: DC | PRN

## 2014-12-03 MED ORDER — KETOROLAC TROMETHAMINE 30 MG/ML IJ SOLN
30.0000 mg | Freq: Once | INTRAMUSCULAR | Status: AC
  Administered 2014-12-03: 30 mg via INTRAVENOUS
  Filled 2014-12-03: qty 1

## 2014-12-03 MED ORDER — PROMETHAZINE HCL 25 MG PO TABS: 25.0000 mg | ORAL_TABLET | Freq: Four times a day (QID) | ORAL | Status: DC | PRN

## 2014-12-03 MED ORDER — ONDANSETRON HCL 4 MG PO TABS: 4.0000 mg | ORAL_TABLET | Freq: Four times a day (QID) | ORAL | Status: DC | PRN

## 2014-12-03 MED ORDER — PRENATAL MULTIVITAMIN CH
1.0000 | ORAL_TABLET | Freq: Every day | ORAL | Status: DC
Start: 2014-12-03 — End: 2014-12-03
  Filled 2014-12-03 (×2): qty 1

## 2014-12-03 MED ORDER — ONDANSETRON HCL 4 MG/2ML IJ SOLN
4.0000 mg | Freq: Once | INTRAMUSCULAR | Status: AC
  Administered 2014-12-03: 4 mg via INTRAVENOUS
  Filled 2014-12-03: qty 2

## 2014-12-03 MED ORDER — LACTATED RINGERS IV SOLN
INTRAVENOUS | Status: DC
  Administered 2014-12-03: 19:00:00 via INTRAVENOUS

## 2014-12-03 MED ORDER — ONDANSETRON HCL 4 MG/2ML IJ SOLN
4.0000 mg | Freq: Four times a day (QID) | INTRAMUSCULAR | Status: DC | PRN
  Administered 2014-12-03: 4 mg via INTRAVENOUS
  Filled 2014-12-03 (×2): qty 2

## 2014-12-03 NOTE — Discharge Instructions (Signed)
TAKE COLACE TWICE A DAY TAKE MIRALAX DAILY IF NO BM IN 24 HOURS, CAN USE DULCOLAX SUPPOSITORY FOR QUICK RELIEF STAY ON CLEAR LIQUIDS FOR 24 HOURS, THEN ADVANCE DIET SLOWLY WITH BLAND AND SOFT FOODS. CALL [facility] FOR ANY QUESTIONS OR CONCERNS.   Preventing Constipation After Surgery Constipation is when a person has fewer than 3 bowel movements a week; has difficulty having a bowel movement; or has stools that are dry, hard, or larger than normal. Many things can make constipation likely after surgery. They include:  Medicines, especially numbing medicines (anesthetics) and very strong pain medicines called narcotics.  Feeling stressed because of the surgery.  Eating different foods than normal.  Being less active. Symptoms of constipation include:  Having fewer than 3 bowel movements a week.  Straining to have a bowel movement.  Having hard, dry, or larger-than-normal stools.  Feeling full or bloated.  Having pain in the lower abdomen.  Not feeling relief after having a bowel movement. HOME CARE INSTRUCTIONS  Diet  Eat foods that have a lot of fiber. These include fruits, vegetables, whole grains, and beans. Limit foods high in fat and processed sugars. These include french fries, hamburgers, cookies, and candy.  Take a fiber supplement as directed. If you are not taking a fiber supplement and think that you are not getting enough fiber from foods, talk to your health care provider about adding a fiber supplement to your diet.  Drink clear fluids, especially water. Avoid drinking alcohol, caffeine, and soda. These can make constipation worse.  Drink enough fluids to keep your urine clear or pale yellow. Activity   After surgery, return to your normal activities slowly or when your health care provider says it is okay.  Start walking as soon as you can. Try to go a little farther each day.  Once your health care provider approves, do some sort of regular exercise. This helps  prevent constipation. Bowel Movements  Go to the restroom when you have the urge to go. Do not hold it in.  Try drinking something hot to get a bowel movement started.  Keep track of how often you use the restroom. If you miss 2-3 bowel movements, talk to your health care provider about medicines that prevent constipation. Your health care provider may suggest a stool softener, laxative, or fiber supplement.  Only take over-the-counter or prescription medicines as directed by your health care provider.  Do not take other medicines without talking to your health care provider first. If you become constipated and take a medicine to make you have a bowel movement, the problem may get worse. Other kinds of medicine can also make the problem worse. SEEK MEDICAL CARE IF:  You used stool softeners or laxatives and still have not had a bowel movement within 24-48 hours after using them.  You have not had a bowel movement in 3 days. SEEK IMMEDIATE MEDICAL CARE IF:   Your constipation lasts for more than 4 days or gets worse.  You have bright red blood in your stool.  You have abdominal or rectal pain.  You have very bad cramping.  You have thin, pencil-like stools.  You have unexplained weight loss.  You have a fever or persistent symptoms for more than 2-3 days.  You have a fever and your symptoms suddenly get worse. Document Released: 01/06/2013 Document Revised: 01/26/2014 Document Reviewed: 01/06/2013 [facility] Patient Information 2015 [place], [place]. This information is not intended to replace advice given to you by your health care  provider. Make sure you discuss any questions you have with your health care provider.

## 2014-12-03 NOTE — Progress Notes (Signed)
Pt escorted by nursing staff to vehicle via wheelchair. Pt comfortable at this time. Belongings accounted for. Reviewed Discharge instructions and new medications. Note for work attached to discharge instructions.

## 2014-12-03 NOTE — Discharge Summary (Signed)
Physician Discharge Summary  Patient ID: Ebony Allen MRN: [id number] DOB/AGE: Nov 07, 1970 44 y.o.  Admit date: 12/03/2014 Discharge date: 12/03/2014  Admission Diagnoses:  Discharge Diagnoses:  Active Problems:   Latex allergy   S/P myomectomy 11/17/14   Constipation, acute   Discharged Condition: Good  Hospital Course: Presented to [facility] with abdominal pain 16 day s/p hysteroscopic myomectomy with robotic myomectomy.  Had not had BM since weekend.  Labs overall WNL, with WBC ct 15.8.  No fever, other VSS.  Flat and erect plate xray of abdomen showed no obstruction or ileus, and no free air.  Placed on 3rd floor for SSE--received 2 SSE with good results.  Home on Colace and Miralax daily, Rx Phenergan 25 mg po q 6 hours prn..  To f/u as scheduled, with plan for office to call patient on Monday to check on status.  Consults: None  Significant Diagnostic Studies: labs:  Results for orders placed or performed during the hospital encounter of 12/03/14 (from the past 24 hour(s))  CBC with Differential     Status: Abnormal   Collection Time: 12/03/14  9:57 AM  Result Value Ref Range   WBC 13.8 (H) 4.0 - 10.5 K/uL   RBC 6.02 (H) 3.87 - 5.11 MIL/uL   Hemoglobin 15.8 (H) 12.0 - 15.0 g/dL   HCT 49.4 (H) 36.0 - 46.0 %   MCV 82.1 78.0 - 100.0 fL   MCH 26.2 26.0 - 34.0 pg   MCHC 32.0 30.0 - 36.0 g/dL   RDW 17.6 (H) 11.5 - 15.5 %   Platelets 471 (H) 150 - 400 K/uL   Neutrophils Relative % 81 (H) 43 - 77 %   Neutro Abs 11.1 (H) 1.7 - 7.7 K/uL   Lymphocytes Relative 14 12 - 46 %   Lymphs Abs 2.0 0.7 - 4.0 K/uL   Monocytes Relative 4 3 - 12 %   Monocytes Absolute 0.5 0.1 - 1.0 K/uL   Eosinophils Relative 1 0 - 5 %   Eosinophils Absolute 0.1 0.0 - 0.7 K/uL   Basophils Relative 0 0 - 1 %   Basophils Absolute 0.0 0.0 - 0.1 K/uL  Comprehensive metabolic panel     Status: Abnormal   Collection Time: 12/03/14  9:57 AM  Result Value Ref Range   Sodium 139 135 - 145 mmol/L   Potassium 4.3 3.5 -  5.1 mmol/L   Chloride 103 96 - 112 mmol/L   CO2 23 19 - 32 mmol/L   Glucose, Bld 131 (H) 70 - 99 mg/dL   BUN 20 6 - 23 mg/dL   Creatinine, Ser 1.00 0.50 - 1.10 mg/dL   Calcium 10.6 (H) 8.4 - 10.5 mg/dL   Total Protein 8.8 (H) 6.0 - 8.3 g/dL   Albumin 4.6 3.5 - 5.2 g/dL   AST 26 0 - 37 U/L   ALT 20 0 - 35 U/L   Alkaline Phosphatase 69 39 - 117 U/L   Total Bilirubin 0.9 0.3 - 1.2 mg/dL   GFR calc non Af Amer 67 (L) >90 mL/min   GFR calc Af Amer 78 (L) >90 mL/min   Anion gap 13 5 - 15  Urinalysis, dipstick only     Status: Abnormal   Collection Time: 12/03/14 12:12 PM  Result Value Ref Range   Specific Gravity, Urine >1.030 (H) 1.005 - 1.030   pH 6.0 5.0 - 8.0   Glucose, UA NEGATIVE NEGATIVE mg/dL   Hgb urine dipstick SMALL (A) NEGATIVE   Bilirubin Urine NEGATIVE  NEGATIVE   Ketones, ur 15 (A) NEGATIVE mg/dL   Protein, ur 30 (A) NEGATIVE mg/dL   Urobilinogen, UA 0.2 0.0 - 1.0 mg/dL   Nitrite NEGATIVE NEGATIVE   Leukocytes, UA NEGATIVE NEGATIVE    and radiology: Abd xray no evidence of ileus or obstruction or free air.  Stool noted.  Treatments: IV hydration and SSE, Toradol, Zofran  Discharge Exam: Blood pressure 127/78, pulse 69, temperature 98.9 F (37.2 C), temperature source Oral, resp. rate 18, height 5\' 3"  (1.6 m), weight 140 lb (63.504 kg), SpO2 100 %. General appearance: alert Resp: clear to auscultation bilaterally Cardio: regular rate and rhythm, S1, S2 normal, no murmur, click, rub or gallop Extremities: extremities normal, atraumatic, no cyanosis or edema Skin: Surgical incisions CDI  Disposition: 01-Home or Self Care     Medication List    TAKE these medications        HAIR/SKIN/NAILS PO  Take 1 tablet by mouth daily.     ibuprofen 600 MG tablet  Commonly known as:  ADVIL,MOTRIN  1  po  pc with 6 ounces of water every 6 hours for 5 days then prn-pain     ondansetron 4 MG tablet  Commonly known as:  ZOFRAN  Take 1 tablet (4 mg total) by mouth every  8 (eight) hours as needed for nausea or vomiting.     oxyCODONE-acetaminophen 5-325 MG per tablet  Commonly known as:  PERCOCET/ROXICET  Take 1-2 tablets by mouth every 6 (six) hours as needed for severe pain.     pantoprazole 40 MG tablet  Commonly known as:  PROTONIX  Take 1 tablet (40 mg total) by mouth daily.     promethazine 25 MG tablet  Commonly known as:  PHENERGAN  Take 1 tablet (25 mg total) by mouth every 6 (six) hours as needed for nausea or vomiting.     Vitamin D 2000 UNITS Caps  Take 1 capsule by mouth daily.       Follow-up Information    Follow up with [facility].   Specialty:  Obstetrics and Gynecology   Why:  Keep scheduled appt, call as needed for any concerns or questions.   Contact information:   [address] [place] [address] [phone]      Signed: Donnel Saxon 12/03/2014, 6:15 PM

## 2014-12-03 NOTE — H&P (Signed)
44 yo G0 at 16 days s/p hysteroscopy with Myosure myomectomy followed by robotically assisted myomectomy presented after calling office with onset yesterday of severe abdominal pain, and today with N/V.  Has been unable to keep food, fluids, or oral pain meds down since last night.  Denies fever, dysuria, diarrhea, discharge, has minimal bleeding.  No BM since "the weekend", took colace yesterday.  Reports no drainage from incisions.  Abdominal pain is diffuse, but more pronounced in upper abdomen.  Mother is with patient.  Patient had an uncomplicated operative and post-op course, with d/c home on same day.  Did return to work yesterday.  Seen 3/8 by Dr. Cletis Media for post-op, with normal findings.  Patient Active Problem List   Diagnosis Date Noted  . Latex allergy 12/03/2014  . S/P myomectomy 11/17/14 12/03/2014  . Fibroids 11/17/2014  . GERD (gastroesophageal reflux disease) 03/09/2014    Chief Complaint  Patient presents with  . Emesis  . Abdominal Pain   HPI:  See above  OB History    No data available      Past Medical History  Diagnosis Date  . GERD (gastroesophageal reflux disease)   . Anemia     Past Surgical History  Procedure Laterality Date  . Esophageal dilation      Dr Ronalee Red  . Dilatation & curettage/hysteroscopy with myosure N/A 11/17/2014    Procedure: [place];  Surgeon: Delsa Bern, MD;  Location: [facility];  Service: Gynecology;  Laterality: N/A;  . Robot assisted myomectomy N/A 11/17/2014    Procedure: ROBOTIC ASSISTED MYOMECTOMY;  Surgeon: Delsa Bern, MD;  Location: [facility];  Service: Gynecology;  Laterality: N/A;    History reviewed. No pertinent family history.  History  Substance Use Topics  . Smoking status: Never Smoker   . Smokeless tobacco: Never Used  . Alcohol Use: Yes     Comment:  socially    Allergies: No Known Allergies  Prescriptions prior to admission  Medication Sig Dispense Refill Last  Dose  . Cholecalciferol (VITAMIN D) 2000 UNITS CAPS Take 1 capsule by mouth daily.   Past Week at Unknown time  . Fe Fum-FePoly-Vit C-Vit B3 (INTEGRA PO) Take 1 tablet by mouth daily.   Past Week at Unknown time  . ibuprofen (ADVIL,MOTRIN) 600 MG tablet 1  po  pc with 6 ounces of water every 6 hours for 5 days then prn-pain 30 tablet 1   . Multiple Vitamins-Minerals (HAIR/SKIN/NAILS PO) Take 1 tablet by mouth daily.   Past Week at Unknown time  . Omega-3 Fatty Acids (FISH OIL PO) Take 2 capsules by mouth daily.   Past Week at Unknown time  . ondansetron (ZOFRAN) 4 MG tablet Take 1 tablet (4 mg total) by mouth every 8 (eight) hours as needed for nausea or vomiting. 20 tablet 0   . oxyCODONE-acetaminophen (PERCOCET/ROXICET) 5-325 MG per tablet Take 1-2 tablets by mouth every 6 (six) hours as needed for severe pain. 30 tablet 0   . pantoprazole (PROTONIX) 40 MG tablet Take 1 tablet (40 mg total) by mouth daily. 30 tablet 3 11/17/2014 at Unknown time    ROS:  Abdominal pain, constipation, N/V Physical Exam   Blood pressure 116/79, pulse 82, temperature 97.9 F (36.6 C), temperature source Oral, resp. rate 18, height 5\' 3"  (1.6 m), weight 140 lb (63.504 kg).  Physical Exam Appears in moderate distress with abdominal pain Chest clear Heart RRR without murmur Abd--diminished bowel sounds in all quadrants, guarding and rebound  in all quadrants, upper abdomen more painful than lower, but all areas 8/10 on pain scale. Robotic incisions CDI, no erythema or drainage, NT Pelvic--deferred at present, small amount brown d/c noted. + mid back pain to palpation, but not classically tender in CVA Ext WNL, negative Homan's, no edema  ED Course  Assessment: 16 days s/p hysteroscopy with myosure myomectomy with additional robotic myomectomy Abdominal pain, likely related to constipation Latex allergy  Plan: CBC, diff, CMP UA, urine culture IV hydration Zofran Toradol initially, Dilaudid prn if  Toradol not effective. Will consult with Dr. Cletis Media after labs returned.  Donnel Saxon CNM, MSN 12/03/2014 9:51 AM  Addendum: Feeling better after Toradol and Zofran. Pain now 4/10, nausea "almost gone".  No void yet--has received 1 liter IV hydration.  Results for orders placed or performed during the hospital encounter of 12/03/14 (from the past 24 hour(s))  CBC with Differential     Status: Abnormal   Collection Time: 12/03/14  9:57 AM  Result Value Ref Range   WBC 13.8 (H) 4.0 - 10.5 K/uL   RBC 6.02 (H) 3.87 - 5.11 MIL/uL   Hemoglobin 15.8 (H) 12.0 - 15.0 g/dL   HCT 49.4 (H) 36.0 - 46.0 %   MCV 82.1 78.0 - 100.0 fL   MCH 26.2 26.0 - 34.0 pg   MCHC 32.0 30.0 - 36.0 g/dL   RDW 17.6 (H) 11.5 - 15.5 %   Platelets 471 (H) 150 - 400 K/uL   Neutrophils Relative % 81 (H) 43 - 77 %   Neutro Abs 11.1 (H) 1.7 - 7.7 K/uL   Lymphocytes Relative 14 12 - 46 %   Lymphs Abs 2.0 0.7 - 4.0 K/uL   Monocytes Relative 4 3 - 12 %   Monocytes Absolute 0.5 0.1 - 1.0 K/uL   Eosinophils Relative 1 0 - 5 %   Eosinophils Absolute 0.1 0.0 - 0.7 K/uL   Basophils Relative 0 0 - 1 %   Basophils Absolute 0.0 0.0 - 0.1 K/uL  Comprehensive metabolic panel     Status: Abnormal   Collection Time: 12/03/14  9:57 AM  Result Value Ref Range   Sodium 139 135 - 145 mmol/L   Potassium 4.3 3.5 - 5.1 mmol/L   Chloride 103 96 - 112 mmol/L   CO2 23 19 - 32 mmol/L   Glucose, Bld 131 (H) 70 - 99 mg/dL   BUN 20 6 - 23 mg/dL   Creatinine, Ser 1.00 0.50 - 1.10 mg/dL   Calcium 10.6 (H) 8.4 - 10.5 mg/dL   Total Protein 8.8 (H) 6.0 - 8.3 g/dL   Albumin 4.6 3.5 - 5.2 g/dL   AST 26 0 - 37 U/L   ALT 20 0 - 35 U/L   Alkaline Phosphatase 69 39 - 117 U/L   Total Bilirubin 0.9 0.3 - 1.2 mg/dL   GFR calc non Af Amer 67 (L) >90 mL/min   GFR calc Af Amer 78 (L) >90 mL/min   Anion gap 13 5 - 15   Will consult with Dr. Cletis Media regarding plan of care. Patient and mother updated on labs and that I am awaiting Dr. Boyd Kerbs  call.  Donnel Saxon, CNM 12/03/14 11:10a  Addendum: Per consult with Dr. Cletis Media: Flat plate of abdomen, r/o obstruction If no evidence obstruction, plan SS enema. Patient now up to BR for void.  Donnel Saxon, CNM 12/03/14 12:10p  Addendum: Still feeling better. Returned from xray:  CLINICAL DATA: Patient status post robotic myomectomy. Nausea and  vomiting. Diffuse abdominal pain.  EXAM: ABDOMEN - 1 VIEW  COMPARISON: None  FINDINGS: Relative paucity of small bowel gas. Stool visualized within the colon. Nonspecific lucencies within the right lateral hemi abdomen. Normal osseous skeleton.  IMPRESSION: Nonspecific lucencies within the right lateral hemi abdomen may potentially represent gas within bowel loops. Given the supine nature of the evaluation, free intraperitoneal air cannot be excluded. Recommend correlation with erect or decubitus images.  Relative paucity of small bowel gas.  These results were called by telephone at the time of interpretation on 12/03/2014 at 1:09 pm to Dr. Donnel Saxon , who verbally acknowledged these results.   Electronically Signed  By: Lovey Newcomer M.D.  On: 12/03/2014 13:09  Results for orders placed or performed during the hospital encounter of 12/03/14 (from the past 24 hour(s))  CBC with Differential     Status: Abnormal   Collection Time: 12/03/14  9:57 AM  Result Value Ref Range   WBC 13.8 (H) 4.0 - 10.5 K/uL   RBC 6.02 (H) 3.87 - 5.11 MIL/uL   Hemoglobin 15.8 (H) 12.0 - 15.0 g/dL   HCT 49.4 (H) 36.0 - 46.0 %   MCV 82.1 78.0 - 100.0 fL   MCH 26.2 26.0 - 34.0 pg   MCHC 32.0 30.0 - 36.0 g/dL   RDW 17.6 (H) 11.5 - 15.5 %   Platelets 471 (H) 150 - 400 K/uL   Neutrophils Relative % 81 (H) 43 - 77 %   Neutro Abs 11.1 (H) 1.7 - 7.7 K/uL   Lymphocytes Relative 14 12 - 46 %   Lymphs Abs 2.0 0.7 - 4.0 K/uL   Monocytes Relative 4 3 - 12 %   Monocytes Absolute 0.5 0.1 - 1.0 K/uL   Eosinophils Relative 1 0 - 5 %    Eosinophils Absolute 0.1 0.0 - 0.7 K/uL   Basophils Relative 0 0 - 1 %   Basophils Absolute 0.0 0.0 - 0.1 K/uL  Comprehensive metabolic panel     Status: Abnormal   Collection Time: 12/03/14  9:57 AM  Result Value Ref Range   Sodium 139 135 - 145 mmol/L   Potassium 4.3 3.5 - 5.1 mmol/L   Chloride 103 96 - 112 mmol/L   CO2 23 19 - 32 mmol/L   Glucose, Bld 131 (H) 70 - 99 mg/dL   BUN 20 6 - 23 mg/dL   Creatinine, Ser 1.00 0.50 - 1.10 mg/dL   Calcium 10.6 (H) 8.4 - 10.5 mg/dL   Total Protein 8.8 (H) 6.0 - 8.3 g/dL   Albumin 4.6 3.5 - 5.2 g/dL   AST 26 0 - 37 U/L   ALT 20 0 - 35 U/L   Alkaline Phosphatase 69 39 - 117 U/L   Total Bilirubin 0.9 0.3 - 1.2 mg/dL   GFR calc non Af Amer 67 (L) >90 mL/min   GFR calc Af Amer 78 (L) >90 mL/min   Anion gap 13 5 - 15  Urinalysis, dipstick only     Status: Abnormal   Collection Time: 12/03/14 12:12 PM  Result Value Ref Range   Specific Gravity, Urine >1.030 (H) 1.005 - 1.030   pH 6.0 5.0 - 8.0   Glucose, UA NEGATIVE NEGATIVE mg/dL   Hgb urine dipstick SMALL (A) NEGATIVE   Bilirubin Urine NEGATIVE NEGATIVE   Ketones, ur 15 (A) NEGATIVE mg/dL   Protein, ur 30 (A) NEGATIVE mg/dL   Urobilinogen, UA 0.2 0.0 - 1.0 mg/dL   Nitrite NEGATIVE NEGATIVE   Leukocytes, UA  NEGATIVE NEGATIVE   Urine to culture.  Per recommendation of radiologist, will do erect abdominal xray for complete evaluation.  Donnel Saxon, CNM 12/03/14 1:20p  Addendum: Returned from [place].  Pain minimal (2/10), no nausea. Filed Vitals:   12/03/14 0931 12/03/14 1153  BP: 116/79 136/76  Pulse: 82 65  Temp: 97.9 F (36.6 C) 98.7 F (37.1 C)  TempSrc: Oral Oral  Resp: 18 18  Height: 5\' 3"  (1.6 m)   Weight: 140 lb (63.504 kg)   SpO2:  100%    CLINICAL DATA: Patient status post robotic myomectomy. Diffuse abdominal pain.  EXAM: ABDOMEN - 2 VIEW  COMPARISON: Earlier same date  FINDINGS: Relative paucity of small bowel gas. Stool visualized within  the ascending and transverse colon. No free intraperitoneal air. No pneumatosis or portal venous gas. Lung bases are clear. Visualized osseous skeleton is unremarkable.  IMPRESSION: Nonobstructed bowel gas pattern.  No free intraperitoneal air.   Electronically Signed  By: Lovey Newcomer M.D.  On: 12/03/2014 13:56   Will send to 3rd floor for SSE until clear, then anticipate d/c home. Will recommend Miralax daily.  Donnel Saxon, CNM 12/03/14 2:30p

## 2014-12-03 NOTE — MAU Note (Signed)
Pt had myomectomy on 2/23. Stared having abd and N/V yesterday.

## 2014-12-03 NOTE — MAU Provider Note (Signed)
History   44 yo G0 at 16 days s/p hysteroscopy with Myosure myomectomy followed by robotically assisted myomectomy presented after calling office with onset yesterday of severe abdominal pain, and today with N/V.  Has been unable to keep food, fluids, or oral pain meds down since last night.  Denies fever, dysuria, diarrhea, discharge, has minimal bleeding.  No BM since "the weekend", took colace yesterday.  Reports no drainage from incisions.  Abdominal pain is diffuse, but more pronounced in upper abdomen.  Mother is with patient.  Patient had an uncomplicated operative and post-op course, with d/c home on same day.  Did return to work yesterday.  Seen 3/8 by Dr. Cletis Media for post-op, with normal findings.  Patient Active Problem List   Diagnosis Date Noted  . Latex allergy 12/03/2014  . S/P myomectomy 11/17/14 12/03/2014  . Fibroids 11/17/2014  . GERD (gastroesophageal reflux disease) 03/09/2014    Chief Complaint  Patient presents with  . Emesis  . Abdominal Pain   HPI:  See above  OB History    No data available      Past Medical History  Diagnosis Date  . GERD (gastroesophageal reflux disease)   . Anemia     Past Surgical History  Procedure Laterality Date  . Esophageal dilation      Dr Ronalee Red  . Dilatation & curettage/hysteroscopy with myosure N/A 11/17/2014    Procedure: [place];  Surgeon: Delsa Bern, MD;  Location: [facility];  Service: Gynecology;  Laterality: N/A;  . Robot assisted myomectomy N/A 11/17/2014    Procedure: ROBOTIC ASSISTED MYOMECTOMY;  Surgeon: Delsa Bern, MD;  Location: [facility];  Service: Gynecology;  Laterality: N/A;    History reviewed. No pertinent family history.  History  Substance Use Topics  . Smoking status: Never Smoker   . Smokeless tobacco: Never Used  . Alcohol Use: Yes     Comment:  socially    Allergies: No Known Allergies  Prescriptions prior to admission  Medication Sig Dispense  Refill Last Dose  . Cholecalciferol (VITAMIN D) 2000 UNITS CAPS Take 1 capsule by mouth daily.   Past Week at Unknown time  . Fe Fum-FePoly-Vit C-Vit B3 (INTEGRA PO) Take 1 tablet by mouth daily.   Past Week at Unknown time  . ibuprofen (ADVIL,MOTRIN) 600 MG tablet 1  po  pc with 6 ounces of water every 6 hours for 5 days then prn-pain 30 tablet 1   . Multiple Vitamins-Minerals (HAIR/SKIN/NAILS PO) Take 1 tablet by mouth daily.   Past Week at Unknown time  . Omega-3 Fatty Acids (FISH OIL PO) Take 2 capsules by mouth daily.   Past Week at Unknown time  . ondansetron (ZOFRAN) 4 MG tablet Take 1 tablet (4 mg total) by mouth every 8 (eight) hours as needed for nausea or vomiting. 20 tablet 0   . oxyCODONE-acetaminophen (PERCOCET/ROXICET) 5-325 MG per tablet Take 1-2 tablets by mouth every 6 (six) hours as needed for severe pain. 30 tablet 0   . pantoprazole (PROTONIX) 40 MG tablet Take 1 tablet (40 mg total) by mouth daily. 30 tablet 3 11/17/2014 at Unknown time    ROS:  Abdominal pain, constipation, N/V Physical Exam   Blood pressure 116/79, pulse 82, temperature 97.9 F (36.6 C), temperature source Oral, resp. rate 18, height 5\' 3"  (1.6 m), weight 140 lb (63.504 kg).  Physical Exam Appears in moderate distress with abdominal pain Chest clear Heart RRR without murmur Abd--diminished bowel sounds in all quadrants,  guarding and rebound in all quadrants, upper abdomen more painful than lower, but all areas 8/10 on pain scale. Robotic incisions CDI, no erythema or drainage, NT Pelvic--deferred at present, small amount brown d/c noted. + mid back pain to palpation, but not classically tender in CVA Ext WNL, negative Homan's, no edema  ED Course  Assessment: 16 days s/p hysteroscopy with myosure myomectomy with additional robotic myomectomy Abdominal pain, likely related to constipation Latex allergy  Plan: CBC, diff, CMP UA, urine culture IV hydration Zofran Toradol initially, Dilaudid  prn if Toradol not effective. Will consult with Dr. Cletis Media after labs returned.  Donnel Saxon CNM, MSN 12/03/2014 9:51 AM  Addendum: Feeling better after Toradol and Zofran. Pain now 4/10, nausea "almost gone".  No void yet--has received 1 liter IV hydration.  Results for orders placed or performed during the hospital encounter of 12/03/14 (from the past 24 hour(s))  CBC with Differential     Status: Abnormal   Collection Time: 12/03/14  9:57 AM  Result Value Ref Range   WBC 13.8 (H) 4.0 - 10.5 K/uL   RBC 6.02 (H) 3.87 - 5.11 MIL/uL   Hemoglobin 15.8 (H) 12.0 - 15.0 g/dL   HCT 49.4 (H) 36.0 - 46.0 %   MCV 82.1 78.0 - 100.0 fL   MCH 26.2 26.0 - 34.0 pg   MCHC 32.0 30.0 - 36.0 g/dL   RDW 17.6 (H) 11.5 - 15.5 %   Platelets 471 (H) 150 - 400 K/uL   Neutrophils Relative % 81 (H) 43 - 77 %   Neutro Abs 11.1 (H) 1.7 - 7.7 K/uL   Lymphocytes Relative 14 12 - 46 %   Lymphs Abs 2.0 0.7 - 4.0 K/uL   Monocytes Relative 4 3 - 12 %   Monocytes Absolute 0.5 0.1 - 1.0 K/uL   Eosinophils Relative 1 0 - 5 %   Eosinophils Absolute 0.1 0.0 - 0.7 K/uL   Basophils Relative 0 0 - 1 %   Basophils Absolute 0.0 0.0 - 0.1 K/uL  Comprehensive metabolic panel     Status: Abnormal   Collection Time: 12/03/14  9:57 AM  Result Value Ref Range   Sodium 139 135 - 145 mmol/L   Potassium 4.3 3.5 - 5.1 mmol/L   Chloride 103 96 - 112 mmol/L   CO2 23 19 - 32 mmol/L   Glucose, Bld 131 (H) 70 - 99 mg/dL   BUN 20 6 - 23 mg/dL   Creatinine, Ser 1.00 0.50 - 1.10 mg/dL   Calcium 10.6 (H) 8.4 - 10.5 mg/dL   Total Protein 8.8 (H) 6.0 - 8.3 g/dL   Albumin 4.6 3.5 - 5.2 g/dL   AST 26 0 - 37 U/L   ALT 20 0 - 35 U/L   Alkaline Phosphatase 69 39 - 117 U/L   Total Bilirubin 0.9 0.3 - 1.2 mg/dL   GFR calc non Af Amer 67 (L) >90 mL/min   GFR calc Af Amer 78 (L) >90 mL/min   Anion gap 13 5 - 15   Will consult with Dr. Cletis Media regarding plan of care. Patient and mother updated on labs and that I am awaiting Dr.  Boyd Kerbs call.  Donnel Saxon, CNM 12/03/14 11:10a  Addendum: Per consult with Dr. Cletis Media: Flat plate of abdomen, r/o obstruction If no evidence obstruction, plan SS enema. Patient now up to BR for void.  Donnel Saxon, CNM 12/03/14 12:10p  Addendum: Still feeling better. Returned from xray:  CLINICAL DATA: Patient status post robotic  myomectomy. Nausea and vomiting. Diffuse abdominal pain.  EXAM: ABDOMEN - 1 VIEW  COMPARISON: None  FINDINGS: Relative paucity of small bowel gas. Stool visualized within the colon. Nonspecific lucencies within the right lateral hemi abdomen. Normal osseous skeleton.  IMPRESSION: Nonspecific lucencies within the right lateral hemi abdomen may potentially represent gas within bowel loops. Given the supine nature of the evaluation, free intraperitoneal air cannot be excluded. Recommend correlation with erect or decubitus images.  Relative paucity of small bowel gas.  These results were called by telephone at the time of interpretation on 12/03/2014 at 1:09 pm to Dr. Donnel Saxon , who verbally acknowledged these results.   Electronically Signed  By: Lovey Newcomer M.D.  On: 12/03/2014 13:09  Results for orders placed or performed during the hospital encounter of 12/03/14 (from the past 24 hour(s))  CBC with Differential     Status: Abnormal   Collection Time: 12/03/14  9:57 AM  Result Value Ref Range   WBC 13.8 (H) 4.0 - 10.5 K/uL   RBC 6.02 (H) 3.87 - 5.11 MIL/uL   Hemoglobin 15.8 (H) 12.0 - 15.0 g/dL   HCT 49.4 (H) 36.0 - 46.0 %   MCV 82.1 78.0 - 100.0 fL   MCH 26.2 26.0 - 34.0 pg   MCHC 32.0 30.0 - 36.0 g/dL   RDW 17.6 (H) 11.5 - 15.5 %   Platelets 471 (H) 150 - 400 K/uL   Neutrophils Relative % 81 (H) 43 - 77 %   Neutro Abs 11.1 (H) 1.7 - 7.7 K/uL   Lymphocytes Relative 14 12 - 46 %   Lymphs Abs 2.0 0.7 - 4.0 K/uL   Monocytes Relative 4 3 - 12 %   Monocytes Absolute 0.5 0.1 - 1.0 K/uL   Eosinophils Relative 1 0 -  5 %   Eosinophils Absolute 0.1 0.0 - 0.7 K/uL   Basophils Relative 0 0 - 1 %   Basophils Absolute 0.0 0.0 - 0.1 K/uL  Comprehensive metabolic panel     Status: Abnormal   Collection Time: 12/03/14  9:57 AM  Result Value Ref Range   Sodium 139 135 - 145 mmol/L   Potassium 4.3 3.5 - 5.1 mmol/L   Chloride 103 96 - 112 mmol/L   CO2 23 19 - 32 mmol/L   Glucose, Bld 131 (H) 70 - 99 mg/dL   BUN 20 6 - 23 mg/dL   Creatinine, Ser 1.00 0.50 - 1.10 mg/dL   Calcium 10.6 (H) 8.4 - 10.5 mg/dL   Total Protein 8.8 (H) 6.0 - 8.3 g/dL   Albumin 4.6 3.5 - 5.2 g/dL   AST 26 0 - 37 U/L   ALT 20 0 - 35 U/L   Alkaline Phosphatase 69 39 - 117 U/L   Total Bilirubin 0.9 0.3 - 1.2 mg/dL   GFR calc non Af Amer 67 (L) >90 mL/min   GFR calc Af Amer 78 (L) >90 mL/min   Anion gap 13 5 - 15  Urinalysis, dipstick only     Status: Abnormal   Collection Time: 12/03/14 12:12 PM  Result Value Ref Range   Specific Gravity, Urine >1.030 (H) 1.005 - 1.030   pH 6.0 5.0 - 8.0   Glucose, UA NEGATIVE NEGATIVE mg/dL   Hgb urine dipstick SMALL (A) NEGATIVE   Bilirubin Urine NEGATIVE NEGATIVE   Ketones, ur 15 (A) NEGATIVE mg/dL   Protein, ur 30 (A) NEGATIVE mg/dL   Urobilinogen, UA 0.2 0.0 - 1.0 mg/dL   Nitrite NEGATIVE NEGATIVE  Leukocytes, UA NEGATIVE NEGATIVE   Urine to culture.  Per recommendation of radiologist, will do erect abdominal xray for complete evaluation.  Donnel Saxon, CNM 12/03/14 1:20p  Addendum: Returned from [place].  Pain minimal (2/10), no nausea. Filed Vitals:   12/03/14 0931 12/03/14 1153  BP: 116/79 136/76  Pulse: 82 65  Temp: 97.9 F (36.6 C) 98.7 F (37.1 C)  TempSrc: Oral Oral  Resp: 18 18  Height: 5\' 3"  (1.6 m)   Weight: 140 lb (63.504 kg)   SpO2:  100%    CLINICAL DATA: Patient status post robotic myomectomy. Diffuse abdominal pain.  EXAM: ABDOMEN - 2 VIEW  COMPARISON: Earlier same date  FINDINGS: Relative paucity of small bowel gas. Stool visualized within  the ascending and transverse colon. No free intraperitoneal air. No pneumatosis or portal venous gas. Lung bases are clear. Visualized osseous skeleton is unremarkable.  IMPRESSION: Nonobstructed bowel gas pattern.  No free intraperitoneal air.   Electronically Signed  By: Lovey Newcomer M.D.  On: 12/03/2014 13:56   Will send to 3rd floor for SSE until clear, then anticipate d/c home. Will recommend Miralax daily.  Donnel Saxon, CNM 12/03/14 2:30p

## 2014-12-04 LAB — URINE CULTURE
COLONY COUNT: NO GROWTH
Culture: NO GROWTH

## 2014-12-06 ENCOUNTER — Inpatient Hospital Stay (HOSPITAL_COMMUNITY)
Admission: AD | Admit: 2014-12-06 | Discharge: 2014-12-13 | DRG: 395 | Disposition: A | Discharge status: Home / Self Care | Payer: BC Managed Care – PPO | Source: Ambulatory Visit | Attending: Obstetrics and Gynecology | Admitting: Obstetrics and Gynecology

## 2014-12-06 ENCOUNTER — Inpatient Hospital Stay (HOSPITAL_COMMUNITY): Payer: BC Managed Care – PPO

## 2014-12-06 ENCOUNTER — Other Ambulatory Visit: Payer: Self-pay | Admitting: Obstetrics and Gynecology

## 2014-12-06 ENCOUNTER — Encounter (HOSPITAL_COMMUNITY): Payer: Self-pay | Admitting: *Deleted

## 2014-12-06 DIAGNOSIS — K56609 Unspecified intestinal obstruction, unspecified as to partial versus complete obstruction: Secondary | ICD-10-CM | POA: Diagnosis present

## 2014-12-06 DIAGNOSIS — K913 Postprocedural intestinal obstruction: Secondary | ICD-10-CM | POA: Diagnosis present

## 2014-12-06 DIAGNOSIS — K565 Intestinal adhesions [bands], unspecified as to partial versus complete obstruction: Secondary | ICD-10-CM

## 2014-12-06 DIAGNOSIS — Z79899 Other long term (current) drug therapy: Secondary | ICD-10-CM | POA: Diagnosis not present

## 2014-12-06 DIAGNOSIS — Z8719 Personal history of other diseases of the digestive system: Secondary | ICD-10-CM

## 2014-12-06 DIAGNOSIS — R109 Unspecified abdominal pain: Secondary | ICD-10-CM | POA: Diagnosis present

## 2014-12-06 LAB — CBC WITH DIFFERENTIAL/PLATELET
BASOS ABS: 0 10*3/uL (ref 0.0–0.1)
Basophils Relative: 0 % (ref 0–1)
EOS ABS: 0.3 10*3/uL (ref 0.0–0.7)
EOS PCT: 3 % (ref 0–5)
HCT: 46.9 % — ABNORMAL HIGH (ref 36.0–46.0)
Hemoglobin: 15.2 g/dL — ABNORMAL HIGH (ref 12.0–15.0)
LYMPHS ABS: 2.1 10*3/uL (ref 0.7–4.0)
LYMPHS PCT: 21 % (ref 12–46)
MCH: 26.4 pg (ref 26.0–34.0)
MCHC: 32.4 g/dL (ref 30.0–36.0)
MCV: 81.6 fL (ref 78.0–100.0)
MONO ABS: 1 10*3/uL (ref 0.1–1.0)
MONOS PCT: 10 % (ref 3–12)
Neutro Abs: 6.8 10*3/uL (ref 1.7–7.7)
Neutrophils Relative %: 66 % (ref 43–77)
Platelets: 463 10*3/uL — ABNORMAL HIGH (ref 150–400)
RBC: 5.75 MIL/uL — AB (ref 3.87–5.11)
RDW: 17.7 % — AB (ref 11.5–15.5)
WBC: 10.1 10*3/uL (ref 4.0–10.5)

## 2014-12-06 LAB — COMPREHENSIVE METABOLIC PANEL
ALT: 13 U/L (ref 0–35)
AST: 18 U/L (ref 0–37)
Albumin: 4.2 g/dL (ref 3.5–5.2)
Alkaline Phosphatase: 54 U/L (ref 39–117)
Anion gap: 14 (ref 5–15)
BUN: 25 mg/dL — ABNORMAL HIGH (ref 6–23)
CALCIUM: 10.2 mg/dL (ref 8.4–10.5)
CHLORIDE: 93 mmol/L — AB (ref 96–112)
CO2: 28 mmol/L (ref 19–32)
Creatinine, Ser: 1 mg/dL (ref 0.50–1.10)
GFR calc Af Amer: 78 mL/min — ABNORMAL LOW (ref >90)
GFR calc non Af Amer: 67 mL/min — ABNORMAL LOW (ref >90)
Glucose, Bld: 126 mg/dL — ABNORMAL HIGH (ref 70–99)
POTASSIUM: 4.2 mmol/L (ref 3.5–5.1)
Sodium: 135 mmol/L (ref 135–145)
Total Bilirubin: 0.5 mg/dL (ref 0.3–1.2)
Total Protein: 7.7 g/dL (ref 6.0–8.3)

## 2014-12-06 LAB — LIPASE, BLOOD: LIPASE: 18 U/L (ref 11–59)

## 2014-12-06 LAB — AMYLASE: AMYLASE: 87 U/L (ref 0–105)

## 2014-12-06 MED ORDER — LACTATED RINGERS IV SOLN
INTRAVENOUS | Status: DC
Start: 2014-12-06 — End: 2014-12-07
  Administered 2014-12-06 – 2014-12-07 (×5): via INTRAVENOUS

## 2014-12-06 MED ORDER — KETOROLAC TROMETHAMINE 30 MG/ML IJ SOLN: 30.0000 mg | Freq: Four times a day (QID) | INTRAMUSCULAR | Status: DC | PRN

## 2014-12-06 MED ORDER — METOCLOPRAMIDE HCL 5 MG/ML IJ SOLN: 10.0000 mg | Freq: Four times a day (QID) | INTRAMUSCULAR | Status: DC | PRN

## 2014-12-06 MED ORDER — LACTATED RINGERS IV BOLUS (SEPSIS)
500.0000 mL | Freq: Once | INTRAVENOUS | Status: AC
  Administered 2014-12-06: 500 mL via INTRAVENOUS

## 2014-12-06 MED ORDER — KETOROLAC TROMETHAMINE 30 MG/ML IJ SOLN: 30.0000 mg | Freq: Four times a day (QID) | INTRAMUSCULAR | Status: DC

## 2014-12-06 MED ORDER — PROMETHAZINE HCL 25 MG/ML IJ SOLN
25.0000 mg | Freq: Four times a day (QID) | INTRAMUSCULAR | Status: DC
  Administered 2014-12-06 – 2014-12-07 (×5): 25 mg via INTRAVENOUS
  Filled 2014-12-06 (×5): qty 1

## 2014-12-06 MED ORDER — PRENATAL MULTIVITAMIN CH
1.0000 | ORAL_TABLET | Freq: Every day | ORAL | Status: DC
Start: 2014-12-06 — End: 2014-12-13
  Administered 2014-12-12: 1 via ORAL
  Filled 2014-12-06 (×4): qty 1

## 2014-12-06 MED ORDER — METOCLOPRAMIDE HCL 5 MG/ML IJ SOLN
10.0000 mg | Freq: Four times a day (QID) | INTRAMUSCULAR | Status: DC
Start: 2014-12-06 — End: 2014-12-13
  Administered 2014-12-06 – 2014-12-13 (×26): 10 mg via INTRAVENOUS
  Filled 2014-12-06 (×34): qty 2

## 2014-12-06 MED ORDER — ZOLPIDEM TARTRATE 5 MG PO TABS
5.0000 mg | ORAL_TABLET | Freq: Every evening | ORAL | Status: DC | PRN
Start: 2014-12-06 — End: 2014-12-13

## 2014-12-06 MED ORDER — KETOROLAC TROMETHAMINE 30 MG/ML IJ SOLN
30.0000 mg | Freq: Four times a day (QID) | INTRAMUSCULAR | Status: AC
  Administered 2014-12-06 – 2014-12-11 (×17): 30 mg via INTRAVENOUS
  Filled 2014-12-06 (×17): qty 1

## 2014-12-06 MED ORDER — KETOROLAC TROMETHAMINE 30 MG/ML IJ SOLN
30.0000 mg | Freq: Four times a day (QID) | INTRAMUSCULAR | Status: AC
  Filled 2014-12-06: qty 1

## 2014-12-06 MED ORDER — KETOROLAC TROMETHAMINE 30 MG/ML IJ SOLN
30.0000 mg | Freq: Once | INTRAMUSCULAR | Status: AC
  Administered 2014-12-06: 30 mg via INTRAVENOUS
  Filled 2014-12-06: qty 1

## 2014-12-06 NOTE — Plan of Care (Signed)
Problem: Phase II Progression Outcomes Goal: Progress activity as tolerated unless otherwise ordered Outcome: Completed/Met Date Met:  12/06/14 Patient has been walking entire hall every couple of hours. Goal: Discharge plan established Outcome: Completed/Met Date Met:  12/06/14 Passing flatus well and good BM Pain controlled Good bowel sounds Distention resolving No nausea and vomiting

## 2014-12-06 NOTE — H&P (Signed)
HISTORY:  44 yo Go presented s/p hysteroscopic and robotic myomectomy on 2/23 with severe abdominal pain and constipation.  Seen 3/10 in MAU for same complaints, with N/V.  Had had no BM in approx 5 days prior to MAU evaluation.  Flat/upright films of abdomen showed non obstructed bowel gas pattern.  Patient received 2 SSE with benefit, Zofran x 2 for nausea, and was sent home on that evening feeling much better.  Since then, she still has had minimal stool, despite Dulcolax suppositories and Miralax--now feels very bloated, with abdominal pain.  She is belching frequently, but not passing flatus.  Denies fever, nausea, vomiting, dysuria at this time.  CBC Latest Ref Rng 12/03/2014 11/13/2014  WBC 4.0 - 10.5 K/uL 13.8(H) 5.0  Hemoglobin 12.0 - 15.0 g/dL 15.8(H) 13.3  Hematocrit 36.0 - 46.0 % 49.4(H) 43.0  Platelets 150 - 400 K/uL 471(H) 318   Urine culture negative from 12/03/14.  Patient's mother with her today.  Reports patient has been taking Percocet, with last dose 2 nights ago, and Ibuprophen, last dose last night.  Rx'd Phenergan from MAU visit, took yesterday.  Has been unable to eat since Saturday, with minimal fluid intake since then due to "feeling so full" and due to pain.  Patient Active Problem List   Diagnosis Date Noted  . Abdominal pain 12/06/2014  . Latex allergy 12/03/2014  . S/P myomectomy 11/17/14 12/03/2014  . Constipation, acute 12/03/2014  . Fibroids 11/17/2014  . GERD (gastroesophageal reflux disease) 03/09/2014    Chief Complaint  Patient presents with  . Abdominal Pain  . Chest Pain   HPI:  See above  OB History    No data available      Past Medical History  Diagnosis Date  . GERD (gastroesophageal reflux disease)   . Anemia     Past Surgical History  Procedure Laterality Date  . Esophageal dilation      Dr Ronalee Red  . Dilatation & curettage/hysteroscopy with myosure N/A 11/17/2014    Procedure: [place];   Surgeon: Delsa Bern, MD;  Location: [facility];  Service: Gynecology;  Laterality: N/A;  . Robot assisted myomectomy N/A 11/17/2014    Procedure: ROBOTIC ASSISTED MYOMECTOMY;  Surgeon: Delsa Bern, MD;  Location: [facility];  Service: Gynecology;  Laterality: N/A;    History reviewed. No pertinent family history.  History  Substance Use Topics  . Smoking status: Never Smoker   . Smokeless tobacco: Never Used  . Alcohol Use: Yes     Comment:  socially    Allergies:  Allergies  Allergen Reactions  . Nickel Rash    Prescriptions prior to admission  Medication Sig Dispense Refill Last Dose  . Cholecalciferol (VITAMIN D) 2000 UNITS CAPS Take 1 capsule by mouth daily.   Past Week at Unknown time  . ibuprofen (ADVIL,MOTRIN) 600 MG tablet 1  po  pc with 6 ounces of water every 6 hours for 5 days then prn-pain 30 tablet 1   . Multiple Vitamins-Minerals (HAIR/SKIN/NAILS PO) Take 1 tablet by mouth daily.   Past Week at Unknown time  . ondansetron (ZOFRAN) 4 MG tablet Take 1 tablet (4 mg total) by mouth every 8 (eight) hours as needed for nausea or vomiting. (Patient not taking: Reported on 12/03/2014) 20 tablet 0 prn  . oxyCODONE-acetaminophen (PERCOCET/ROXICET) 5-325 MG per tablet Take 1-2 tablets by mouth every 6 (six) hours as needed for severe pain. 30 tablet 0 Past Week at Unknown time  . pantoprazole (PROTONIX)  40 MG tablet Take 1 tablet (40 mg total) by mouth daily. 30 tablet 3 Past Week at Unknown time  . promethazine (PHENERGAN) 25 MG tablet Take 1 tablet (25 mg total) by mouth every 6 (six) hours as needed for nausea or vomiting. 30 tablet 0     ROS:  Abd and back pain Physical Exam   Blood pressure 135/90, pulse 87, temperature 97.6 F (36.4 C), resp. rate 18.  Physical Exam  In moderate distress, mother very attentive at bedside Chest clear Heart RRR without murmur Back--negative CVAT Abd--guarding and rebound bilaterally, mild upper abdominal distension.  Myomectomy incisions  healing well. Pelvic--deferred, no bleeding Ext WNL   ED Course  Assessment: 19 days s/p hysteroscopic/robotic myomectomy Abdominal pain Constipation  Plan: IV hydration CBC, diff, CMP, amylase, lipase Toradol 30 mg IV. Abdominal xray (flat plate, upright, and chest view). Will consult with Dr. Charlesetta Garibaldi after xray returned.   Donnel Saxon CNM, MSN 12/06/2014 11:08 AM   Addendum: Returned from [place]:  EXAM: ACUTE ABDOMEN SERIES (ABDOMEN 2 VIEW & CHEST 1 VIEW)  COMPARISON: Abdominal radiograph 12/03/2014.  FINDINGS: Lung volumes are normal. No consolidative airspace disease. No pleural effusions. No pneumothorax. No pulmonary nodule or mass noted. Pulmonary vasculature and the cardiomediastinal silhouette are within normal limits.  Supine and upright views of the abdomen demonstrate multiple dilated loops of small bowel in the central abdomen measuring up to 3.8 cm in diameter. Multiple air-fluid levels are noted. A small amount of colonic gas and stool is noted. No pneumoperitoneum.  IMPRESSION: 1. Findings are concerning for early or partial small bowel obstruction, as above. 2. No pneumoperitoneum. 3. No radiographic evidence of acute cardiopulmonary disease.   Electronically Signed  By: Vinnie Langton M.D.  On: 12/06/2014 11:18  Dr. Charlesetta Garibaldi updated. Plan: Admission to 3rd floor. NPO IV hydration Reglan 10 mg IV q 6 hours Phenergan 25 mg IV q 6 hours Toradol 30 mg IV q 6 hours prn  Reviewed dx and plan of care with patient and mother, with previous and current xrays reviewed, questions discussed. Dr. Charlesetta Garibaldi will see today, with Dr. Cletis Media on call tonight for further management.  Donnel Saxon, CNM 12/06/14 11:50a

## 2014-12-06 NOTE — MAU Note (Signed)
Pt presents to MAU with complaints of pain in her lower back and abdomen. Myomectomy on February, Last BM was Thursday the 10th and she was given an enema in MAU that day.

## 2014-12-06 NOTE — MAU Provider Note (Signed)
History   44 yo Go presented s/p hysteroscopic and robotic myomectomy on 2/23 with severe abdominal pain and constipation.  Seen 3/10 in MAU for same complaints, with N/V.  Had had no BM in approx 5 days prior to MAU evaluation.  Flat/upright films of abdomen showed non obstructed bowel gas pattern.  Patient received 2 SSE with benefit, Zofran x 2 for nausea, and was sent home on that evening feeling much better.  Since then, she still has had minimal stool, despite Dulcolax suppositories and Miralax--now feels very bloated, with abdominal pain.  She is belching frequently, but not passing flatus.  Denies fever, nausea, vomiting, dysuria at this time.  CBC Latest Ref Rng 12/03/2014 11/13/2014  WBC 4.0 - 10.5 K/uL 13.8(H) 5.0  Hemoglobin 12.0 - 15.0 g/dL 15.8(H) 13.3  Hematocrit 36.0 - 46.0 % 49.4(H) 43.0  Platelets 150 - 400 K/uL 471(H) 318   Urine culture negative from 12/03/14.  Patient's mother with her today.  Reports patient has been taking Percocet, with last dose 2 nights ago, and Ibuprophen, last dose last night.  Rx'd Phenergan from MAU visit, took yesterday.  Has been unable to eat since Saturday, with minimal fluid intake since then due to "feeling so full" and due to pain.  Patient Active Problem List   Diagnosis Date Noted  . Abdominal pain 12/06/2014  . Latex allergy 12/03/2014  . S/P myomectomy 11/17/14 12/03/2014  . Constipation, acute 12/03/2014  . Fibroids 11/17/2014  . GERD (gastroesophageal reflux disease) 03/09/2014    Chief Complaint  Patient presents with  . Abdominal Pain  . Chest Pain   HPI:  See above  OB History    No data available      Past Medical History  Diagnosis Date  . GERD (gastroesophageal reflux disease)   . Anemia     Past Surgical History  Procedure Laterality Date  . Esophageal dilation      Dr Ronalee Red  . Dilatation & curettage/hysteroscopy with myosure N/A 11/17/2014    Procedure: [place];   Surgeon: Delsa Bern, MD;  Location: [facility];  Service: Gynecology;  Laterality: N/A;  . Robot assisted myomectomy N/A 11/17/2014    Procedure: ROBOTIC ASSISTED MYOMECTOMY;  Surgeon: Delsa Bern, MD;  Location: [facility];  Service: Gynecology;  Laterality: N/A;    History reviewed. No pertinent family history.  History  Substance Use Topics  . Smoking status: Never Smoker   . Smokeless tobacco: Never Used  . Alcohol Use: Yes     Comment:  socially    Allergies:  Allergies  Allergen Reactions  . Nickel Rash    Prescriptions prior to admission  Medication Sig Dispense Refill Last Dose  . Cholecalciferol (VITAMIN D) 2000 UNITS CAPS Take 1 capsule by mouth daily.   Past Week at Unknown time  . ibuprofen (ADVIL,MOTRIN) 600 MG tablet 1  po  pc with 6 ounces of water every 6 hours for 5 days then prn-pain 30 tablet 1   . Multiple Vitamins-Minerals (HAIR/SKIN/NAILS PO) Take 1 tablet by mouth daily.   Past Week at Unknown time  . ondansetron (ZOFRAN) 4 MG tablet Take 1 tablet (4 mg total) by mouth every 8 (eight) hours as needed for nausea or vomiting. (Patient not taking: Reported on 12/03/2014) 20 tablet 0 prn  . oxyCODONE-acetaminophen (PERCOCET/ROXICET) 5-325 MG per tablet Take 1-2 tablets by mouth every 6 (six) hours as needed for severe pain. 30 tablet 0 Past Week at Unknown time  . pantoprazole (  PROTONIX) 40 MG tablet Take 1 tablet (40 mg total) by mouth daily. 30 tablet 3 Past Week at Unknown time  . promethazine (PHENERGAN) 25 MG tablet Take 1 tablet (25 mg total) by mouth every 6 (six) hours as needed for nausea or vomiting. 30 tablet 0     ROS:  Abd and back pain Physical Exam   Blood pressure 135/90, pulse 87, temperature 97.6 F (36.4 C), resp. rate 18.  Physical Exam  In moderate distress, mother very attentive at bedside Chest clear Heart RRR without murmur Back--negative CVAT Abd--guarding and rebound bilaterally, mild upper abdominal distension.  Myomectomy incisions  healing well. Pelvic--deferred, no bleeding Ext WNL   ED Course  Assessment: 19 days s/p hysteroscopic/robotic myomectomy Abdominal pain Constipation  Plan: IV hydration CBC, diff, CMP, amylase, lipase Toradol 30 mg IV. Abdominal xray (flat plate, upright, and chest view). Will consult with Dr. Charlesetta Garibaldi after xray returned.   Donnel Saxon CNM, MSN 12/06/2014 11:08 AM   Addendum: Returned from [place]:  EXAM: ACUTE ABDOMEN SERIES (ABDOMEN 2 VIEW & CHEST 1 VIEW)  COMPARISON: Abdominal radiograph 12/03/2014.  FINDINGS: Lung volumes are normal. No consolidative airspace disease. No pleural effusions. No pneumothorax. No pulmonary nodule or mass noted. Pulmonary vasculature and the cardiomediastinal silhouette are within normal limits.  Supine and upright views of the abdomen demonstrate multiple dilated loops of small bowel in the central abdomen measuring up to 3.8 cm in diameter. Multiple air-fluid levels are noted. A small amount of colonic gas and stool is noted. No pneumoperitoneum.  IMPRESSION: 1. Findings are concerning for early or partial small bowel obstruction, as above. 2. No pneumoperitoneum. 3. No radiographic evidence of acute cardiopulmonary disease.   Electronically Signed  By: Vinnie Langton M.D.  On: 12/06/2014 11:18  Dr. Charlesetta Garibaldi updated. Plan: Admission to 3rd floor. NPO IV hydration Reglan 10 mg IV q 6 hours Phenergan 25 mg IV q 6 hours Toradol 30 mg IV q 6 hours prn  Reviewed dx and plan of care with patient and mother, with previous and current xrays reviewed, questions discussed. Dr. Charlesetta Garibaldi will see today, with Dr. Cletis Media on call tonight for further management.  Donnel Saxon, CNM 12/06/14 11:50a

## 2014-12-07 LAB — CBC WITH DIFFERENTIAL/PLATELET
BASOS ABS: 0 10*3/uL (ref 0.0–0.1)
BASOS PCT: 0 % (ref 0–1)
Eosinophils Absolute: 0.2 10*3/uL (ref 0.0–0.7)
Eosinophils Relative: 3 % (ref 0–5)
HCT: 42.3 % (ref 36.0–46.0)
HEMOGLOBIN: 13.5 g/dL (ref 12.0–15.0)
Lymphocytes Relative: 29 % (ref 12–46)
Lymphs Abs: 1.9 10*3/uL (ref 0.7–4.0)
MCH: 26.1 pg (ref 26.0–34.0)
MCHC: 31.9 g/dL (ref 30.0–36.0)
MCV: 81.7 fL (ref 78.0–100.0)
MONO ABS: 0.7 10*3/uL (ref 0.1–1.0)
Monocytes Relative: 10 % (ref 3–12)
NEUTROS ABS: 3.9 10*3/uL (ref 1.7–7.7)
Neutrophils Relative %: 58 % (ref 43–77)
Platelets: 364 10*3/uL (ref 150–400)
RBC: 5.18 MIL/uL — AB (ref 3.87–5.11)
RDW: 17.8 % — ABNORMAL HIGH (ref 11.5–15.5)
WBC: 6.8 10*3/uL (ref 4.0–10.5)

## 2014-12-07 LAB — COMPREHENSIVE METABOLIC PANEL
ALT: 12 U/L (ref 0–35)
AST: 16 U/L (ref 0–37)
Albumin: 3.5 g/dL (ref 3.5–5.2)
Alkaline Phosphatase: 46 U/L (ref 39–117)
Anion gap: 12 (ref 5–15)
BILIRUBIN TOTAL: 0.7 mg/dL (ref 0.3–1.2)
BUN: 27 mg/dL — ABNORMAL HIGH (ref 6–23)
CO2: 28 mmol/L (ref 19–32)
CREATININE: 1.15 mg/dL — AB (ref 0.50–1.10)
Calcium: 9.3 mg/dL (ref 8.4–10.5)
Chloride: 98 mmol/L (ref 96–112)
GFR calc Af Amer: 66 mL/min — ABNORMAL LOW (ref >90)
GFR calc non Af Amer: 57 mL/min — ABNORMAL LOW (ref >90)
GLUCOSE: 104 mg/dL — AB (ref 70–99)
POTASSIUM: 3.9 mmol/L (ref 3.5–5.1)
Sodium: 138 mmol/L (ref 135–145)
TOTAL PROTEIN: 6.8 g/dL (ref 6.0–8.3)

## 2014-12-07 MED ORDER — PROMETHAZINE HCL 25 MG/ML IJ SOLN
25.0000 mg | Freq: Four times a day (QID) | INTRAMUSCULAR | Status: DC | PRN
  Administered 2014-12-10: 25 mg via INTRAVENOUS
  Filled 2014-12-07: qty 1

## 2014-12-07 MED ORDER — PANTOPRAZOLE SODIUM 40 MG IV SOLR
40.0000 mg | INTRAVENOUS | Status: DC
  Administered 2014-12-07 – 2014-12-13 (×6): 40 mg via INTRAVENOUS
  Filled 2014-12-07 (×8): qty 40

## 2014-12-07 MED ORDER — KCL IN DEXTROSE-NACL 20-5-0.45 MEQ/L-%-% IV SOLN
INTRAVENOUS | Status: DC
  Administered 2014-12-07 – 2014-12-10 (×10): via INTRAVENOUS
  Filled 2014-12-07 (×15): qty 1000

## 2014-12-07 MED ORDER — PHENOL 1.4 % MT LIQD
1.0000 | OROMUCOSAL | Status: DC | PRN
  Administered 2014-12-07: 1 via OROMUCOSAL
  Filled 2014-12-07: qty 177

## 2014-12-07 NOTE — Progress Notes (Signed)
Ebony Allen is a71 y.o.  [id number]  Hospital Day #  1:  Abdominal Distention/? Partial SBO  Subjective: Patient is continuing to complain of abdominal distention with no flatus. Patient has not had any nausea since early morning.  Continues to ambulate in the halls every few hours.  Objective: Vital signs in last 24 hours: Temp:  [97.6 F (36.4 C)-99.4 F (37.4 C)] 99.4 F (37.4 C) (03/14 0554) Pulse Rate:  [73-103] 98 (03/14 0554) Resp:  [16-18] 18 (03/14 0554) BP: (125-146)/(90-95) 134/90 mmHg (03/14 0554) SpO2:  [95 %-100 %] 100 % (03/14 0554) Weight:  [140 lb (63.504 kg)-147 lb 1 oz (66.707 kg)] 147 lb 1 oz (66.707 kg) (03/14 0554)  Intake/Output from previous day: 03/13 0701 - 03/14 0700 In: 2745.9 [I.V.:2745.9] Out: 1135 [Urine:1125] Intake/Output this shift:    Recent Labs Lab 12/03/14 0957 12/06/14 1130  WBC 13.8* 10.1  HGB 15.8* 15.2*  HCT 49.4* 46.9*  PLT 471* 463*     Recent Labs Lab 12/03/14 0957 12/06/14 1059  NA 139 135  K 4.3 4.2  CL 103 93*  CO2 23 28  BUN 20 25*  CREATININE 1.00 1.00  CALCIUM 10.6* 10.2  PROT 8.8* 7.7  BILITOT 0.9 0.5  ALKPHOS 69 54  ALT 20 13  AST 26 18  GLUCOSE 131* 126*    EXAM: General: alert, cooperative and mild distress Resp: clear to auscultation bilaterally Cardio: regular rate and rhythm, S1, S2 normal, no murmur, click, rub or gallop GI: Rare bowel sounds with significant distention Extremities: SCD hose in place and functioning;  no calf tenderness.   Assessment:  Abdominal Distention ?  Partial Small Bowel Obstruction S/P Robot Assisted Myomectomy/Hysteroscopic Myomectomy   (Post Op Day #20) Plan: Per Dr. Cletis Media NG tube-low suction and Protonix IV 40 mg;  CBC with diff & CMET-pending Routine Care  LOS: 1 day    Logon Uttech, PA-C 12/07/2014 7:50 AM

## 2014-12-07 NOTE — Progress Notes (Signed)
Feeling much better since NG tube but still no flatus. Is now feeling hungry. No nausea. Labs reviewed with normalisation of WBC but slight increase in Creatinine I/O yesterday + 1600 cc NG tube actively draining. BS are absent left abdomen and very diminished right abdomen.   Imp: delayed ileus vs small post-op bowel obstruction         Bowel injury unlikely  Plan: NG tube and NPO          Await bowel function          May need general surgery involvement if persistent symptoms despite expectant management          Plan of care reviewed with patient who is agreable

## 2014-12-07 NOTE — Progress Notes (Signed)
RN in to place NG tube per order, pt and mother had reservations about placing NG tube stating we were told we could wait "24 to 48 hours before placing." Dr. Cletis Media notified at [phone] requesting she discuss the questions the pt has with her, Dr. Cletis Media stated RN could wait to place the tube until she has been in to see pt. RN in to tell pt that we could wait to place tube until Dr. Cletis Media arrives. Pt states she wants to go ahead and place tube. Dr. Cletis Media notified again at [phone], and updated that pt wants to go ahead with tube placement. Dr. Cletis Media wanted RN to educate pt that NG tube would help decompress abdomen. Dr. Cletis Media agreed to go ahead and place tube... Pt agreed with plan of care. Will continue to monitor pt.

## 2014-12-08 LAB — COMPREHENSIVE METABOLIC PANEL
ALBUMIN: 3.1 g/dL — AB (ref 3.5–5.2)
ALK PHOS: 41 U/L (ref 39–117)
ALT: 12 U/L (ref 0–35)
AST: 18 U/L (ref 0–37)
Anion gap: 7 (ref 5–15)
BUN: 23 mg/dL (ref 6–23)
CO2: 28 mmol/L (ref 19–32)
Calcium: 8.3 mg/dL — ABNORMAL LOW (ref 8.4–10.5)
Chloride: 102 mmol/L (ref 96–112)
Creatinine, Ser: 0.92 mg/dL (ref 0.50–1.10)
GFR calc non Af Amer: 75 mL/min — ABNORMAL LOW (ref >90)
GFR, EST AFRICAN AMERICAN: 87 mL/min — AB (ref >90)
GLUCOSE: 138 mg/dL — AB (ref 70–99)
Potassium: 3.9 mmol/L (ref 3.5–5.1)
Sodium: 137 mmol/L (ref 135–145)
Total Bilirubin: 0.4 mg/dL (ref 0.3–1.2)
Total Protein: 6.2 g/dL (ref 6.0–8.3)

## 2014-12-08 LAB — CBC
HCT: 40.1 % (ref 36.0–46.0)
HEMOGLOBIN: 12.7 g/dL (ref 12.0–15.0)
MCH: 25.9 pg — ABNORMAL LOW (ref 26.0–34.0)
MCHC: 31.7 g/dL (ref 30.0–36.0)
MCV: 81.8 fL (ref 78.0–100.0)
Platelets: 339 10*3/uL (ref 150–400)
RBC: 4.9 MIL/uL (ref 3.87–5.11)
RDW: 17.7 % — ABNORMAL HIGH (ref 11.5–15.5)
WBC: 5.5 10*3/uL (ref 4.0–10.5)

## 2014-12-08 NOTE — Progress Notes (Signed)
Ebony Allen is a29 y.o.  [id number]  Hospital Day #2 Subjective: Patient is feeling a little better than yesterday though reports feeling very full. Patient has some pain and reports a very tight abdomen, but continues to ambulate.  Objective: Vital signs in last 24 hours: Temp:  [97.8 F (36.6 C)-99.5 F (37.5 C)] 97.8 F (36.6 C) (03/15 0613) Pulse Rate:  [91-103] 91 (03/15 0613) Resp:  [16-18] 16 (03/15 0613) BP: (127-142)/(86-98) 133/94 mmHg (03/15 0613) SpO2:  [94 %-100 %] 98 % (03/15 0613)  Intake/Output from previous day: 03/14 0701 - 03/15 0700 In: 3400 [P.O.:50; I.V.:3350] Out: 1550 [Urine:800] Intake/Output this shift: Total I/O In: 1700 [P.O.:50; I.V.:1650] Out: 325 [Urine:325]  Recent Labs Lab 12/06/14 1130 12/07/14 0830 12/08/14 0523  WBC 10.1 6.8 5.5  HGB 15.2* 13.5 12.7  HCT 46.9* 42.3 40.1  PLT 463* 364 339     Recent Labs Lab 12/06/14 1059 12/07/14 0830 12/08/14 0523  NA 135 138 137  K 4.2 3.9 3.9  CL 93* 98 102  CO2 28 28 28   BUN 25* 27* 23  CREATININE 1.00 1.15* 0.92  CALCIUM 10.2 9.3 8.3*  PROT 7.7 6.8 6.2  BILITOT 0.5 0.7 0.4  ALKPHOS 54 46 41  ALT 13 12 12   AST 18 16 18   GLUCOSE 126* 104* 138*    EXAM: General: alert, cooperative and mild distress Resp: clear to auscultation bilaterally Cardio: regular rate and rhythm, S1, S2 normal, no murmur, click, rub or gallop GI: Increase in bowel sounds, very distended and tense abdomen. Extremities: no tenderness   Assessment:  Ileus vs Partial Small Bowel Obstruction  S/P Robot Assisted Myomectomy/Hysteroscopic Myomectomy (Post op day #21)  Plan: Routine Care  LOS: 2 days    POWELL,ELMIRA, PA-C 12/08/2014 6:55 AM

## 2014-12-08 NOTE — Progress Notes (Signed)
S: feeling better except for persistent distention     Hungry +++     Has passed minimal flatus last night     No nausea or vomiting  O: VS normal except for slight elevation of BP  ( 2nd NSAIDS ?)       Lungs: clear       Abdomen: improved BS but still significantly diminished, abdomen distended and appropriately tender       EXT: normal  I/O: NG 1200 cc yesterday and good diuresis  Labs: normal with normalization of creatinine  A: persistent ileus  P: continue current care     Consider surgery consult in am     Patient agreeable with plan

## 2014-12-08 NOTE — Progress Notes (Signed)
NG tube cannister changed at 1800. Output for 12 hours was 850 mL. Green drainage. RN noted drainage throughout entire tubing. Dr. Cletis Media updated of NG output and patient reported bowel movement. No new orders given at this time. Will continue to monitor patient.

## 2014-12-09 ENCOUNTER — Inpatient Hospital Stay (HOSPITAL_COMMUNITY): Payer: BC Managed Care – PPO

## 2014-12-09 MED ORDER — IOHEXOL 300 MG/ML  SOLN
50.0000 mL | Freq: Once | INTRAMUSCULAR | Status: AC | PRN
Start: 2014-12-09 — End: 2014-12-09
  Administered 2014-12-09: 50 mL via ORAL

## 2014-12-09 MED ORDER — IOHEXOL 300 MG/ML  SOLN
100.0000 mL | Freq: Once | INTRAMUSCULAR | Status: AC | PRN
  Administered 2014-12-09: 100 mL via INTRAVENOUS

## 2014-12-09 NOTE — Progress Notes (Signed)
I received a referral from pt's RN due to complications in pt's recovery.  Tuwana reported that she has good support from her family and that she is doing well emotionally and spiritually even though she is physically uncomfortable with the NG tube.  She asked for me to keep her in prayer, which I will gladly do, but she did not wish to speak further at this time.    [employer] Pager, [phone] 10:03 AM    12/09/14 1000  Clinical Encounter Type  Visited With Patient and family together  Visit Type Initial  Referral From Nurse

## 2014-12-09 NOTE — Progress Notes (Signed)
Ebony Allen is a33 y.o.  [id number]  Hospital Day #3:  Ileus  Subjective: Patient is about the same as yesterday.  Throughout the night had what she described as excess secretions that caused her to taste bile and feel the need to clear her food tube.  After decompression by R.N.   the patient felt better and no longer has that sensation. Patient has some pain with brief relief when she passes flatus. though abdomen remain distended.  Tolerating ice chips, ambulates and voids without difficulty.  Objective: Vital signs in last 24 hours: Temp:  [98.4 F (36.9 C)-99.1 F (37.3 C)] 98.9 F (37.2 C) (03/16 0640) Pulse Rate:  [94-109] 99 (03/16 0640) Resp:  [16-18] 18 (03/16 0640) BP: (120-139)/(75-90) 120/83 mmHg (03/16 0640) SpO2:  [99 %-100 %] 99 % (03/16 0640)  Intake/Output from previous day: 03/15 0701 - 03/16 0700 In: 3715 [P.O.:30; I.V.:3685] Out: 2876 [Urine:675] Intake/Output this shift:    Recent Labs Lab 12/06/14 1130 12/07/14 0830 12/08/14 0523  WBC 10.1 6.8 5.5  HGB 15.2* 13.5 12.7  HCT 46.9* 42.3 40.1  PLT 463* 364 339     Recent Labs Lab 12/06/14 1059 12/07/14 0830 12/08/14 0523  NA 135 138 137  K 4.2 3.9 3.9  CL 93* 98 102  CO2 28 28 28   BUN 25* 27* 23  CREATININE 1.00 1.15* 0.92  CALCIUM 10.2 9.3 8.3*  PROT 7.7 6.8 6.2  BILITOT 0.5 0.7 0.4  ALKPHOS 54 46 41  ALT 13 12 12   AST 18 16 18   GLUCOSE 126* 104* 138*    EXAM: General: alert, cooperative and mild distress Resp: clear to auscultation bilaterally Cardio: regular rate and rhythm, S1, S2 normal, no murmur, click, rub or gallop GI: Abdomen distended, faint bowel sounds, no tenderness to percussion. Extremities: Homans sign is negative, no sign of DVT   Assessment: Abdominal Distention-Ileus: stable and ileus present  Plan: Routine care  LOS: 3 days    POWELL,ELMIRA, PA-C 12/09/2014 7:26 AM   Patient reports a small BM Spirits are low today: feels progress is too low Abdomen:  less distended than yesterday with present BS but still diminished  Plan: repeat flat plate today and will review with patient

## 2014-12-09 NOTE — Progress Notes (Signed)
Flat plate of abdomen has improved Patient is feeling the same with minimal flatus and no pain General surgery called for inpatient consultation Patient was just seen by Dr Excell Seltzer: he recommends to proceed with CT scan with contrast to r/o bowel obstruction and/or trocar site bowel herniation Patient understands and is agreable Will follow

## 2014-12-09 NOTE — Progress Notes (Signed)
NG tube canister changed at 0200. Output for 8 hours 950 cc green bile. Will continue to monitor

## 2014-12-09 NOTE — Consult Note (Signed)
Reason for Consult: Post op SBO Referring Physician: Dr. Katharine Look Rivard  Ebony Allen is an 44 y.o. female.  HPI: 44 y/o female who underwent Hysteroscopy with Myosure myomectomy followed by Robotically assisted myomectomy, 11/17/14 by Dr. Katharine Look Rivard.  She went home later that day. She saw Dr. Cletis Media on 12/01/14 and was doing fine and then developed acute changes in a day or 2 after she saw her.   She presented to ED at [facility] on 12/03/14 with severe abdominal pain, nausea and vomiting; unable to keep anything PO down.  She had no BM for about 4-5 days prior to coming back.  She had returned to work before coming back on 12/03/14.  Work up 3/10, showed she was dehydrated and WBC up some.  Film showed:  Relative paucity of small bowel gas. Stool visualized within the ascending and transverse colon. No free intraperitoneal air. No pneumatosis or portal venous gas. She was treated with SSE and Miralax and discharged home. She returned on 12/06/14, with constipation and sever abdominal pain, nausea and vomiting.  She reports minimal stool production even with Ducolax suppositories, and Miralax at home.  Films now show:  Supine and upright views of the abdomen demonstrate multiple dilated loops of small bowel in the central abdomen measuring up to 3.8 cm in diameter. Multiple air-fluid levels are noted. A small amount of colonic gas and stool is noted. No pneumoperitoneum.  She was readmitted and placed on bowel rest with NG decompression, hydration, and Reglan.  She put out 2200 from NG yesterday, 1350 day prior.  NG change cannister not working well when we came to see her.  She has had a small stool on 3/15 and another today.  Single view film today shows NG in place and dialated SB up to 4.9 cm, thought to be improving from prior study 12/06/14. We are ask to see.  Past Medical History  Diagnosis Date  . GERD (gastroesophageal reflux disease)   . Anemia     Past Surgical History  Procedure Laterality Date   . Esophageal dilation      Dr Ronalee Red  . Dilatation & curettage/hysteroscopy with myosure N/A 11/17/2014    Procedure: [place];  Surgeon: Delsa Bern, MD;  Location: [facility];  Service: Gynecology;  Laterality: N/A;  . Robot assisted myomectomy N/A 11/17/2014    Procedure: ROBOTIC ASSISTED MYOMECTOMY;  Surgeon: Delsa Bern, MD;  Location: [facility];  Service: Gynecology;  Laterality: N/A;    History reviewed. No pertinent family history.  Social History:  reports that she has never smoked. She has never used smokeless tobacco. She reports that she drinks alcohol. She reports that she does not use illicit drugs.  Allergies:  Allergies  Allergen Reactions  . Nickel Rash    Medications:  Prior to Admission:  Prescriptions prior to admission  Medication Sig Dispense Refill Last Dose  . Cholecalciferol (VITAMIN D) 2000 UNITS CAPS Take 1 capsule by mouth daily.   Past Week at Unknown time  . ibuprofen (ADVIL,MOTRIN) 200 MG tablet Take 200 mg by mouth every 6 (six) hours as needed for moderate pain (Pt takes in addition to 626m tablet).   12/05/2014 at 2300  . ibuprofen (ADVIL,MOTRIN) 600 MG tablet 1  po  pc with 6 ounces of water every 6 hours for 5 days then prn-pain (Patient taking differently: Take 600 mg by mouth every 6 (six) hours as needed for moderate pain. ) 30 tablet 1 12/05/2014 at 2300  .  Multiple Vitamins-Minerals (HAIR/SKIN/NAILS PO) Take 1 tablet by mouth daily.   Past Week at Unknown time  . omeprazole (PRILOSEC OTC) 20 MG tablet Take 20 mg by mouth daily.   12/06/2014 at Unknown time  . promethazine (PHENERGAN) 25 MG tablet Take 1 tablet (25 mg total) by mouth every 6 (six) hours as needed for nausea or vomiting. 30 tablet 0 Past Week at Unknown time  . ondansetron (ZOFRAN) 4 MG tablet Take 1 tablet (4 mg total) by mouth every 8 (eight) hours as needed for nausea or vomiting. (Patient not taking: Reported on 12/03/2014) 20 tablet 0 prn  .  oxyCODONE-acetaminophen (PERCOCET/ROXICET) 5-325 MG per tablet Take 1-2 tablets by mouth every 6 (six) hours as needed for severe pain. (Patient not taking: Reported on 12/06/2014) 30 tablet 0 Past Week at Unknown time  . pantoprazole (PROTONIX) 40 MG tablet Take 1 tablet (40 mg total) by mouth daily. (Patient not taking: Reported on 12/06/2014) 30 tablet 3 Past Week at Unknown time   Scheduled: . ketorolac  30 mg Intravenous 4 times per day   Or  . ketorolac  30 mg Intramuscular 4 times per day  . metoCLOPramide  10 mg Intravenous 4 times per day  . pantoprazole (PROTONIX) IV  40 mg Intravenous Q24H  . prenatal multivitamin  1 tablet Oral Q1200   Continuous: . dextrose 5 % and 0.45 % NaCl with KCl 20 mEq/L 150 mL/hr at 12/09/14 0425   KVQ:QVZDGL, promethazine, zolpidem Anti-infectives    None      Results for orders placed or performed during the hospital encounter of 12/06/14 (from the past 48 hour(s))  CBC     Status: Abnormal   Collection Time: 12/08/14  5:23 AM  Result Value Ref Range   WBC 5.5 4.0 - 10.5 K/uL   RBC 4.90 3.87 - 5.11 MIL/uL   Hemoglobin 12.7 12.0 - 15.0 g/dL   HCT 40.1 36.0 - 46.0 %   MCV 81.8 78.0 - 100.0 fL   MCH 25.9 (L) 26.0 - 34.0 pg   MCHC 31.7 30.0 - 36.0 g/dL   RDW 17.7 (H) 11.5 - 15.5 %   Platelets 339 150 - 400 K/uL  Comprehensive metabolic panel     Status: Abnormal   Collection Time: 12/08/14  5:23 AM  Result Value Ref Range   Sodium 137 135 - 145 mmol/L   Potassium 3.9 3.5 - 5.1 mmol/L   Chloride 102 96 - 112 mmol/L   CO2 28 19 - 32 mmol/L   Glucose, Bld 138 (H) 70 - 99 mg/dL   BUN 23 6 - 23 mg/dL   Creatinine, Ser 0.92 0.50 - 1.10 mg/dL   Calcium 8.3 (L) 8.4 - 10.5 mg/dL   Total Protein 6.2 6.0 - 8.3 g/dL   Albumin 3.1 (L) 3.5 - 5.2 g/dL   AST 18 0 - 37 U/L   ALT 12 0 - 35 U/L   Alkaline Phosphatase 41 39 - 117 U/L   Total Bilirubin 0.4 0.3 - 1.2 mg/dL   GFR calc non Af Amer 75 (L) >90 mL/min   GFR calc Af Amer 87 (L) >90 mL/min     Comment: (NOTE) The eGFR has been calculated using the CKD EPI equation. This calculation has not been validated in all clinical situations. eGFR's persistently <90 mL/min signify possible Chronic Kidney Disease.    Anion gap 7 5 - 15    Dg Abd 1 View  12/09/2014   CLINICAL DATA:  Ileus versus  small-bowel obstruction and status post myomectomy of the uterus on 11/17/2014.  EXAM: ABDOMEN - 1 VIEW  COMPARISON:  12/06/2014  FINDINGS: Since the prior study, a nasogastric tube is been placed which is partly coiled in the stomach. Overall number of dilated small bowel loops appears improved with some residual dilated proximal jejunum remaining having maximum caliber of 4.9 cm. There is a small amount of air and stool visualized in the colon and findings are likely consistent with improving ileus/partial small bowel obstruction. No free air is identified. Decreased number of air-fluid levels on the upright film.  IMPRESSION: Overall improving pattern of postoperative ileus/partial small bowel obstruction after nasogastric decompression.   Electronically Signed   By: Aletta Edouard M.D.   On: 12/09/2014 09:36    Review of Systems  Constitutional: Negative.   HENT: Negative.   Eyes: Negative.   Respiratory: Negative.   Cardiovascular: Negative.   Gastrointestinal: Positive for nausea, vomiting, abdominal pain and constipation.  Genitourinary: Negative.   Musculoskeletal: Negative.   Skin: Negative.   Neurological: Negative.   Endo/Heme/Allergies: Negative.   Psychiatric/Behavioral: Negative.    Blood pressure 144/87, pulse 99, temperature 99.1 F (37.3 C), temperature source Oral, resp. rate 18, height 5' 3"  (1.6 m), weight 66.707 kg (147 lb 1 oz), SpO2 100 %. Physical Exam  Constitutional: She is oriented to person, place, and time. She appears well-developed and well-nourished. No distress.  HENT:  Head: Normocephalic and atraumatic.  Nose: Nose normal.  NG in place, tape coming off.   Eyes: Right eye exhibits no discharge. Left eye exhibits no discharge.  Neck: Normal range of motion. Neck supple. No JVD present. No tracheal deviation present. No thyromegaly present.  Cardiovascular: Normal rate, regular rhythm and normal heart sounds.   Respiratory: Effort normal and breath sounds normal. No respiratory distress. She has no wheezes. She has no rales. She exhibits no tenderness.  GI: She exhibits distension. She exhibits no mass. There is no tenderness. There is no rebound and no guarding.  Few BS Port sites ok No hernias noted  Musculoskeletal: She exhibits no edema.  Lymphadenopathy:    She has no cervical adenopathy.  Neurological: She is alert and oriented to person, place, and time. No cranial nerve deficit.  Skin: Skin is warm and dry. No rash noted. She is not diaphoretic. No erythema. No pallor.  Psychiatric: She has a normal mood and affect. Her behavior is normal. Judgment and thought content normal.  Physical done by Dr. Excell Seltzer.  Assessment/Plan: SBO s/p myomectomy 11/17/14. Post op constipation Hx of GERD Hx of anemia  Plan:  Continue NG suction, and hydration, she is getting better.  We have ordered a CT scan for tonight.  We will follow with you.  Aniqua Briere 12/09/2014, 3:08 PM

## 2014-12-10 LAB — CBC
HCT: 40.5 % (ref 36.0–46.0)
Hemoglobin: 12.8 g/dL (ref 12.0–15.0)
MCH: 26 pg (ref 26.0–34.0)
MCHC: 31.6 g/dL (ref 30.0–36.0)
MCV: 82.2 fL (ref 78.0–100.0)
PLATELETS: 279 10*3/uL (ref 150–400)
RBC: 4.93 MIL/uL (ref 3.87–5.11)
RDW: 17.2 % — ABNORMAL HIGH (ref 11.5–15.5)
WBC: 6.4 10*3/uL (ref 4.0–10.5)

## 2014-12-10 LAB — BASIC METABOLIC PANEL
ANION GAP: 6 (ref 5–15)
BUN: 11 mg/dL (ref 6–23)
CO2: 24 mmol/L (ref 19–32)
Calcium: 8.3 mg/dL — ABNORMAL LOW (ref 8.4–10.5)
Chloride: 104 mmol/L (ref 96–112)
Creatinine, Ser: 0.97 mg/dL (ref 0.50–1.10)
GFR calc non Af Amer: 70 mL/min — ABNORMAL LOW (ref >90)
GFR, EST AFRICAN AMERICAN: 81 mL/min — AB (ref >90)
Glucose, Bld: 123 mg/dL — ABNORMAL HIGH (ref 70–99)
Potassium: 4.3 mmol/L (ref 3.5–5.1)
Sodium: 134 mmol/L — ABNORMAL LOW (ref 135–145)

## 2014-12-10 MED ORDER — KCL IN DEXTROSE-NACL 20-5-0.9 MEQ/L-%-% IV SOLN
INTRAVENOUS | Status: DC
  Administered 2014-12-10 – 2014-12-13 (×5): via INTRAVENOUS
  Filled 2014-12-10 (×7): qty 1000

## 2014-12-10 MED ORDER — MENTHOL 3 MG MT LOZG: 1.0000 | LOZENGE | OROMUCOSAL | Status: DC | PRN

## 2014-12-10 MED ORDER — LIP MEDEX EX OINT
1.0000 "application " | TOPICAL_OINTMENT | Freq: Two times a day (BID) | CUTANEOUS | Status: DC
  Administered 2014-12-11 – 2014-12-12 (×3): 1 via TOPICAL
  Filled 2014-12-10 (×2): qty 7

## 2014-12-10 MED ORDER — MAGIC MOUTHWASH
15.0000 mL | Freq: Four times a day (QID) | ORAL | Status: DC | PRN
  Filled 2014-12-10: qty 15

## 2014-12-10 MED ORDER — BISACODYL 10 MG RE SUPP: 10.0000 mg | Freq: Two times a day (BID) | RECTAL | Status: DC | PRN

## 2014-12-10 MED ORDER — LACTATED RINGERS IV BOLUS (SEPSIS): 1000.0000 mL | Freq: Three times a day (TID) | INTRAVENOUS | Status: AC | PRN

## 2014-12-10 MED ORDER — ALUM & MAG HYDROXIDE-SIMETH 200-200-20 MG/5ML PO SUSP: 30.0000 mL | Freq: Four times a day (QID) | ORAL | Status: DC | PRN

## 2014-12-10 NOTE — Progress Notes (Signed)
Pt was sitting up when I arrived. Mom was bedside. They were waiting on Ms. Moxey to get transferred to [facility]. Pt's mom was visibly tense about the delay, however she became open to the visit. Pt was hoping to get transferred in order to move forward in medical care. Ironically during our visit transportation arrived. Pt and mom asked for prayer first and following prayer, pt was transported from room. Our visit was very pleasant. Pt and mom enjoyed talking about their faith. Will let Chaplain at [facility] know that pt is scheduled to arrive there. Ernest Haber Chaplain   12/10/14 1500  Clinical Encounter Type  Visited With Patient and family together

## 2014-12-10 NOTE — Progress Notes (Signed)
Subjective: She just got here from [facility].  She says she feels better, few BS, no flatus today. She thinks she is less distended.  Objective: Vital signs in last 24 hours: Temp:  [98.7 F (37.1 C)-100 F (37.8 C)] 98.7 F (37.1 C) (03/17 1000) Pulse Rate:  [87-107] 107 (03/17 1000) Resp:  [15-18] 16 (03/17 1000) BP: (119-139)/(78-92) 119/81 mmHg (03/17 1000) SpO2:  [100 %] 100 % (03/17 1000) Last BM Date: 12/08/14 2500 from the NG yesterday TM 100, HR up some,  Labs OK CT shows a high grade SBO with possible volvulus, or internal hernia.   Intake/Output from previous day: 03/16 0701 - 03/17 0700 In: 3936 [P.O.:10; I.V.:3426; NG/GT:500] Out: 3650 [Urine:1150; Emesis/NG output:2500] Intake/Output this shift:    General appearance: alert, cooperative and no distress Resp: clear to auscultation bilaterally GI: some distension, no pain, few BS,  no flatus or BM  Lab Results:   Recent Labs  12/08/14 0523 12/10/14 0526  WBC 5.5 6.4  HGB 12.7 12.8  HCT 40.1 40.5  PLT 339 279    BMET  Recent Labs  12/08/14 0523 12/10/14 0526  NA 137 134*  K 3.9 4.3  CL 102 104  CO2 28 24  GLUCOSE 138* 123*  BUN 23 11  CREATININE 0.92 0.97  CALCIUM 8.3* 8.3*   PT/INR No results for input(s): LABPROT, INR in the last 72 hours.   Recent Labs Lab 12/06/14 1059 12/07/14 0830 12/08/14 0523  AST 18 16 18   ALT 13 12 12   ALKPHOS 54 46 41  BILITOT 0.5 0.7 0.4  PROT 7.7 6.8 6.2  ALBUMIN 4.2 3.5 3.1*     Lipase     Component Value Date/Time   LIPASE 18 12/06/2014 1130     Studies/Results: Dg Abd 1 View  12/09/2014   CLINICAL DATA:  Ileus versus small-bowel obstruction and status post myomectomy of the uterus on 11/17/2014.  EXAM: ABDOMEN - 1 VIEW  COMPARISON:  12/06/2014  FINDINGS: Since the prior study, a nasogastric tube is been placed which is partly coiled in the stomach. Overall number of dilated small bowel loops appears improved with some residual  dilated proximal jejunum remaining having maximum caliber of 4.9 cm. There is a small amount of air and stool visualized in the colon and findings are likely consistent with improving ileus/partial small bowel obstruction. No free air is identified. Decreased number of air-fluid levels on the upright film.  IMPRESSION: Overall improving pattern of postoperative ileus/partial small bowel obstruction after nasogastric decompression.   Electronically Signed   By: Aletta Edouard M.D.   On: 12/09/2014 09:36   Ct Abdomen Pelvis W Contrast  12/09/2014   CLINICAL DATA:  Abdominal distention and diminished bowel sounds. Myomectomy 11/17/2014.  EXAM: CT ABDOMEN AND PELVIS WITH CONTRAST  TECHNIQUE: Multidetector CT imaging of the abdomen and pelvis was performed using the standard protocol following bolus administration of intravenous contrast.  CONTRAST:  152mL OMNIPAQUE IOHEXOL 300 MG/ML  SOLN  COMPARISON:  None.  FINDINGS: BODY WALL: No contributory findings.  LOWER CHEST: Bibasilar atelectasis with small left pleural effusion.  ABDOMEN/PELVIS:  Liver: Scattered low-density foci most consistent with incidental cysts. No concerning abnormality.  Biliary: No evidence of biliary obstruction or stone.  Pancreas: Unremarkable.  Spleen: Unremarkable.  Adrenals: Unremarkable.  Kidneys and ureters: No hydronephrosis or stone.  Bladder: Unremarkable.  Reproductive: 3 cm cyst in the right ovary with layering hemorrhage.  Enlarged uterus with a likely enhancing mass in the posterior lower  uterine segment measuring 5.5 cm. The appearance favors residual fibroid rather than postoperative change. There are roughly linear areas of decreased enhancement throughout the uterus which is presumably from recent myomectomy. No gas in the endometrial cavity.  Bowel: High-grade small bowel obstruction with abrupt transition point on image 36. The downstream ileum and colon are completely effaced. There is twisting of mesentery in the region of  transition point. The dilated loops of bowel show no pneumatosis or nonenhancing segments to suggest necrosis. There is small reactive ascites and no pneumoperitoneum. Lymph nodes in the associated mesenteries are enlarged, likely edematous. Noted general surgery consultation.  Vascular: No acute abnormality.  OSSEOUS: No acute abnormalities.  IMPRESSION: 1. High-grade small bowel obstruction from either small-bowel volvulus or internal hernia. There is mesenteric congestion and ascites, but no evidence of bowel necrosis. 2. 5.5 cm uterine fibroid with submucosal component. 3. 3 cm right ovary hemorrhagic cyst. 4. Small left pleural effusion and bibasilar atelectasis.   Electronically Signed   By: Monte Fantasia M.D.   On: 12/09/2014 18:49    Medications: . ketorolac  30 mg Intravenous 4 times per day   Or  . ketorolac  30 mg Intramuscular 4 times per day  . metoCLOPramide  10 mg Intravenous 4 times per day  . pantoprazole (PROTONIX) IV  40 mg Intravenous Q24H  . prenatal multivitamin  1 tablet Oral Q1200   . dextrose 5 % and 0.45 % NaCl with KCl 20 mEq/L 150 mL/hr at 12/10/14 0829     Assessment/Plan SBO s/p myomectomy 11/17/14. Post op constipation Hx of GERD Hx of anemia  Plan:  Continue bowel rest, NG decompression, hydration.  Recheck films and labs in AM.      LOS: 4 days    Ramell Wacha 12/10/2014

## 2014-12-10 NOTE — Progress Notes (Signed)
Ebony Allen is a52 y.o.  [id number]  Hospital Day #4:  Small bowel obstruction  S/p robotic myomectomy  Subjective: Patient is about the same as yesterday. Night was difficult with nausea but resolved this morning.Very sad after speaking with Dr Excell Seltzer on the phone. Awaiting transfer to [facility] with pending surgery. Denies pain. Reports minimal flatus.   Objective: Vital signs in last 24 hours: Temp:  [98.9 F (37.2 C)-100 F (37.8 C)] 99.8 F (37.7 C) (03/17 0527) Pulse Rate:  [87-101] 93 (03/17 0527) Resp:  [15-18] 15 (03/17 0527) BP: (130-144)/(78-92) 135/92 mmHg (03/17 0527) SpO2:  [100 %] 100 % (03/17 0527)  Intake/Output from previous day: 03/16 0701 - 03/17 0700 In: 3936 [P.O.:10; I.V.:3426] Out: 3650 [Urine:1150] Intake/Output this shift:    Recent Labs Lab 12/07/14 0830 12/08/14 0523 12/10/14 0526  WBC 6.8 5.5 6.4  HGB 13.5 12.7 12.8  HCT 42.3 40.1 40.5  PLT 364 339 279      Recent Labs Lab 12/06/14 1059 12/07/14 0830 12/08/14 0523 12/10/14 0526  NA 135 138 137 134*  K 4.2 3.9 3.9 4.3  CL 93* 98 102 104  CO2 28 28 28 24   BUN 25* 27* 23 11  CREATININE 1.00 1.15* 0.92 0.97  CALCIUM 10.2 9.3 8.3* 8.3*  PROT 7.7 6.8 6.2  --   BILITOT 0.5 0.7 0.4  --   ALKPHOS 54 46 41  --   ALT 13 12 12   --   AST 18 16 18   --   GLUCOSE 126* 104* 138* 123*    EXAM: General: alert, cooperative and mild distress Resp: clear to auscultation bilaterally Cardio: regular rate and rhythm, S1, S2 normal, no murmur, click, rub or gallop GI: Abdomen distended, improved bowel sounds but still minimal, no tenderness to percussion. Extremities: Homans sign is negative, no sign of DVT   Assessment: Small bowel obstruction / volvulus  Plan:  Awaiting transfer to [facility] and to the care of Dr Excell Seltzer Questions answered  Support offered Will continue to follow with General surgery  Jamesyn Lindell A, PA-C 12/10/2014 9:52 AM

## 2014-12-11 ENCOUNTER — Inpatient Hospital Stay (HOSPITAL_COMMUNITY): Payer: BC Managed Care – PPO

## 2014-12-11 LAB — BASIC METABOLIC PANEL
Anion gap: 11 (ref 5–15)
BUN: 11 mg/dL (ref 6–23)
CALCIUM: 8.4 mg/dL (ref 8.4–10.5)
CO2: 19 mmol/L (ref 19–32)
Chloride: 105 mmol/L (ref 96–112)
Creatinine, Ser: 0.82 mg/dL (ref 0.50–1.10)
GFR calc non Af Amer: 86 mL/min — ABNORMAL LOW (ref >90)
Glucose, Bld: 112 mg/dL — ABNORMAL HIGH (ref 70–99)
POTASSIUM: 4.4 mmol/L (ref 3.5–5.1)
Sodium: 135 mmol/L (ref 135–145)

## 2014-12-11 LAB — CBC
HCT: 39.2 % (ref 36.0–46.0)
HEMOGLOBIN: 12.5 g/dL (ref 12.0–15.0)
MCH: 25.9 pg — ABNORMAL LOW (ref 26.0–34.0)
MCHC: 31.9 g/dL (ref 30.0–36.0)
MCV: 81.2 fL (ref 78.0–100.0)
PLATELETS: 264 10*3/uL (ref 150–400)
RBC: 4.83 MIL/uL (ref 3.87–5.11)
RDW: 17 % — ABNORMAL HIGH (ref 11.5–15.5)
WBC: 6.1 10*3/uL (ref 4.0–10.5)

## 2014-12-11 MED ORDER — CETYLPYRIDINIUM CHLORIDE 0.05 % MT LIQD
7.0000 mL | Freq: Two times a day (BID) | OROMUCOSAL | Status: DC
  Administered 2014-12-11 – 2014-12-12 (×2): 7 mL via OROMUCOSAL

## 2014-12-11 NOTE — Progress Notes (Signed)
Patient is in excellent spirits: had a normal BM this afternoon with significant relief Distention is markedly improved Very hungry Is looking forward to tomorrow's evaluation/ imaging Will continue to follow with you and plan a follow-up in office within 1 week of D/C  Thank you  Delsa Bern MD  [phone]

## 2014-12-11 NOTE — Progress Notes (Signed)
Subjective: She says she feels better.  No complaints, not much flatus, no BM.  Objective: Vital signs in last 24 hours: Temp:  [97.7 F (36.5 C)-99 F (37.2 C)] 98.3 F (36.8 C) (03/18 0630) Pulse Rate:  [84-107] 84 (03/18 0630) Resp:  [16-18] 16 (03/18 0630) BP: (118-136)/(80-94) 118/94 mmHg (03/18 0630) SpO2:  [95 %-100 %] 98 % (03/18 0630) Last BM Date: 12/08/14 3 liters from NG NPO Afebrile, VSS Labs OK Film shows Interval decrease in visible distended small bowel, obstructive pattern persist. Intake/Output from previous day: 03/17 0701 - 03/18 0700 In: 3310 [P.O.:300; I.V.:2950] Out: 3725 [Urine:700; Emesis/NG output:3025] Intake/Output this shift: Total I/O In: 0  Out: 300 [Urine:300]  General appearance: alert, cooperative and no distress GI: soft, minimally distended, few BS.  Lab Results:   Recent Labs  12/10/14 0526 12/11/14 0604  WBC 6.4 6.1  HGB 12.8 12.5  HCT 40.5 39.2  PLT 279 264    BMET  Recent Labs  12/10/14 0526 12/11/14 0604  NA 134* 135  K 4.3 4.4  CL 104 105  CO2 24 19  GLUCOSE 123* 112*  BUN 11 11  CREATININE 0.97 0.82  CALCIUM 8.3* 8.4   PT/INR No results for input(s): LABPROT, INR in the last 72 hours.   Recent Labs Lab 12/06/14 1059 12/07/14 0830 12/08/14 0523  AST 18 16 18   ALT 13 12 12   ALKPHOS 54 46 41  BILITOT 0.5 0.7 0.4  PROT 7.7 6.8 6.2  ALBUMIN 4.2 3.5 3.1*     Lipase     Component Value Date/Time   LIPASE 18 12/06/2014 1130     Studies/Results: Dg Abd 1 View  12/09/2014   CLINICAL DATA:  Ileus versus small-bowel obstruction and status post myomectomy of the uterus on 11/17/2014.  EXAM: ABDOMEN - 1 VIEW  COMPARISON:  12/06/2014  FINDINGS: Since the prior study, a nasogastric tube is been placed which is partly coiled in the stomach. Overall number of dilated small bowel loops appears improved with some residual dilated proximal jejunum remaining having maximum caliber of 4.9 cm. There is a small  amount of air and stool visualized in the colon and findings are likely consistent with improving ileus/partial small bowel obstruction. No free air is identified. Decreased number of air-fluid levels on the upright film.  IMPRESSION: Overall improving pattern of postoperative ileus/partial small bowel obstruction after nasogastric decompression.   Electronically Signed   By: Aletta Edouard M.D.   On: 12/09/2014 09:36   Ct Abdomen Pelvis W Contrast  12/09/2014   CLINICAL DATA:  Abdominal distention and diminished bowel sounds. Myomectomy 11/17/2014.  EXAM: CT ABDOMEN AND PELVIS WITH CONTRAST  TECHNIQUE: Multidetector CT imaging of the abdomen and pelvis was performed using the standard protocol following bolus administration of intravenous contrast.  CONTRAST:  177mL OMNIPAQUE IOHEXOL 300 MG/ML  SOLN  COMPARISON:  None.  FINDINGS: BODY WALL: No contributory findings.  LOWER CHEST: Bibasilar atelectasis with small left pleural effusion.  ABDOMEN/PELVIS:  Liver: Scattered low-density foci most consistent with incidental cysts. No concerning abnormality.  Biliary: No evidence of biliary obstruction or stone.  Pancreas: Unremarkable.  Spleen: Unremarkable.  Adrenals: Unremarkable.  Kidneys and ureters: No hydronephrosis or stone.  Bladder: Unremarkable.  Reproductive: 3 cm cyst in the right ovary with layering hemorrhage.  Enlarged uterus with a likely enhancing mass in the posterior lower uterine segment measuring 5.5 cm. The appearance favors residual fibroid rather than postoperative change. There are roughly linear areas of decreased enhancement  throughout the uterus which is presumably from recent myomectomy. No gas in the endometrial cavity.  Bowel: High-grade small bowel obstruction with abrupt transition point on image 36. The downstream ileum and colon are completely effaced. There is twisting of mesentery in the region of transition point. The dilated loops of bowel show no pneumatosis or nonenhancing  segments to suggest necrosis. There is small reactive ascites and no pneumoperitoneum. Lymph nodes in the associated mesenteries are enlarged, likely edematous. Noted general surgery consultation.  Vascular: No acute abnormality.  OSSEOUS: No acute abnormalities.  IMPRESSION: 1. High-grade small bowel obstruction from either small-bowel volvulus or internal hernia. There is mesenteric congestion and ascites, but no evidence of bowel necrosis. 2. 5.5 cm uterine fibroid with submucosal component. 3. 3 cm right ovary hemorrhagic cyst. 4. Small left pleural effusion and bibasilar atelectasis.   Electronically Signed   By: Monte Fantasia M.D.   On: 12/09/2014 18:49   Dg Abd 2 Views  12/11/2014   CLINICAL DATA:  Bowel obstruction, interval placement of a nasogastric tube with symptomatic improvement  EXAM: ABDOMEN - 2 VIEW  COMPARISON:  Abdominal series of December 08, 2014 and CT scan of the abdomen and pelvis of December 09, 2014  FINDINGS: There remain loops of distended fluid and gas-filled small bowel in the upper abdomen. There is relative paucity of bowel gas in the left mid and lower abdomen. There small amount of colonic gas and stool on the right. A nasogastric tube is present. The tip and proximal port lie in the gastric cardia. The bony structures are unremarkable.  IMPRESSION: Interval mild decrease in the amount of visible distended small bowel. The obstructive pattern persists however.   Electronically Signed   By: David  Martinique   On: 12/11/2014 08:55    Medications: . antiseptic oral rinse  7 mL Mouth Rinse BID  . ketorolac  30 mg Intravenous 4 times per day   Or  . ketorolac  30 mg Intramuscular 4 times per day  . lip balm  1 application Topical BID  . metoCLOPramide  10 mg Intravenous 4 times per day  . pantoprazole (PROTONIX) IV  40 mg Intravenous Q24H  . prenatal multivitamin  1 tablet Oral Q1200    Assessment/Plan  SBO s/p myomectomy 11/17/14. Post op constipation Hx of GERD Hx of  anemia   Plan:  Continue current treatment, with bowel rest, NG decompression, hydration and see how she does.         LOS: 5 days    Helaine Yackel 12/11/2014

## 2014-12-12 ENCOUNTER — Inpatient Hospital Stay (HOSPITAL_COMMUNITY): Payer: BC Managed Care – PPO

## 2014-12-12 MED ORDER — MORPHINE SULFATE 2 MG/ML IJ SOLN
1.0000 mg | INTRAMUSCULAR | Status: DC | PRN
  Filled 2014-12-12: qty 1

## 2014-12-12 NOTE — Progress Notes (Signed)
Patient ID: Ebony Allen, female   DOB: 1971-02-04, 44 y.o.   MRN: [id number]    Subjective: Continues to feel better. Has had some crampy pain but multiple loose bowel movements. No pain currently. NG output reduced.  Objective: Vital signs in last 24 hours: Temp:  [98 F (36.7 C)-98.4 F (36.9 C)] 98.4 F (36.9 C) (03/19 0602) Pulse Rate:  [78-90] 78 (03/19 0602) Resp:  [18-19] 19 (03/19 0602) BP: (114-128)/(72-81) 128/76 mmHg (03/19 0602) SpO2:  [100 %] 100 % (03/19 0602) Last BM Date: 2015-01-10  Intake/Output from previous day: 01-10-23 0701 - 03/19 0700 In: 2400 [I.V.:2400] Out: 2300 [Urine:2000; Emesis/NG output:300] Intake/Output this shift:    General appearance: alert, cooperative and no distress GI: normal findings: soft, non-tender and distention is resolved Incision/Wound: clean and dry  Lab Results:   Recent Labs  12/10/14 0526 2015/01/10 0604  WBC 6.4 6.1  HGB 12.8 12.5  HCT 40.5 39.2  PLT 279 264   BMET  Recent Labs  12/10/14 0526 01-10-2015 0604  NA 134* 135  K 4.3 4.4  CL 104 105  CO2 24 19  GLUCOSE 123* 112*  BUN 11 11  CREATININE 0.97 0.82  CALCIUM 8.3* 8.4     Studies/Results: Dg Abd 2 Views  10-Jan-2015   CLINICAL DATA:  Bowel obstruction, interval placement of a nasogastric tube with symptomatic improvement  EXAM: ABDOMEN - 2 VIEW  COMPARISON:  Abdominal series of December 08, 2014 and CT scan of the abdomen and pelvis of December 09, 2014  FINDINGS: There remain loops of distended fluid and gas-filled small bowel in the upper abdomen. There is relative paucity of bowel gas in the left mid and lower abdomen. There small amount of colonic gas and stool on the right. A nasogastric tube is present. The tip and proximal port lie in the gastric cardia. The bony structures are unremarkable.  IMPRESSION: Interval mild decrease in the amount of visible distended small bowel. The obstructive pattern persists however.   Electronically Signed   By: David  Martinique    On: Jan 10, 2015 08:55   Abdominal x-rays just completed and not read but appeared to show resolving small bowel distention and now  Gas in the large bowel Anti-infectives: Anti-infectives    None      Assessment/Plan: Postoperative small bowel obstruction appears to be resolving. Will discontinue NG tube and start clear liquid diet.    LOS: 6 days    Chrystel Barefield T 12/12/2014

## 2014-12-13 NOTE — Discharge Instructions (Signed)
[facility] ______[facility] °LAPAROSCOPIC SURGERY: POST OP INSTRUCTIONS °Always review your discharge instruction sheet given to you by the facility where your surgery was performed. °IF YOU HAVE DISABILITY OR FAMILY LEAVE FORMS, YOU MUST BRING THEM TO THE OFFICE FOR PROCESSING.   °DO NOT GIVE THEM TO YOUR DOCTOR. ° °1. A prescription for pain medication may be given to you upon discharge.  Take your pain medication as prescribed, if needed.  If narcotic pain medicine is not needed, then you may take acetaminophen (Tylenol) or ibuprofen (Advil) as needed. °2. Take your usually prescribed medications unless otherwise directed. °3. If you need a refill on your pain medication, please contact your pharmacy.  They will contact our office to request authorization. Prescriptions will not be filled after 5pm or on week-ends. °4. You should follow a light diet the first few days after arrival home, such as soup and crackers, etc.  Be sure to include lots of fluids daily. °5. Most patients will experience some swelling and bruising in the area of the incisions.  Ice packs will help.  Swelling and bruising can take several days to resolve.  °6. It is common to experience some constipation if taking pain medication after surgery.  Increasing fluid intake and taking a stool softener (such as Colace) will usually help or prevent this problem from occurring.  A mild laxative (Milk of Magnesia or Miralax) should be taken according to package instructions if there are no bowel movements after 48 hours. °7. Unless discharge instructions indicate otherwise, you may remove your bandages 24-48 hours after surgery, and you may shower at that time.  You may have steri-strips (small skin tapes) in place directly over the incision.  These strips should be left on the skin for 7-10 days.  If your surgeon used skin glue on the incision, you may shower in 24 hours.  The glue will flake off over the next 2-3 weeks.  Any sutures or  staples will be removed at the office during your follow-up visit. °8. ACTIVITIES:  You may resume regular (light) daily activities beginning the next day--such as daily self-care, walking, climbing stairs--gradually increasing activities as tolerated.  You may have sexual intercourse when it is comfortable.  Refrain from any heavy lifting or straining until approved by your doctor. °a. You may drive when you are no longer taking prescription pain medication, you can comfortably wear a seatbelt, and you can safely maneuver your car and apply brakes. °b. RETURN TO WORK:  __________________________________________________________ °9. You should see your doctor in the office for a follow-up appointment approximately 2-3 weeks after your surgery.  Make sure that you call for this appointment within a day or two after you arrive home to insure a convenient appointment time. °10. OTHER INSTRUCTIONS: __________________________________________________________________________________________________________________________ __________________________________________________________________________________________________________________________ °WHEN TO CALL YOUR DOCTOR: °1. Fever over 101.0 °2. Inability to urinate °3. Continued bleeding from incision. °4. Increased pain, redness, or drainage from the incision. °5. Increasing abdominal pain ° °The clinic staff is available to answer your questions during regular business hours.  Please don’t hesitate to call and ask to speak to one of the nurses for clinical concerns.  If you have a medical emergency, go to the nearest emergency room or call 911.  A surgeon from [facility] is always on call at the hospital. °[address], [address], [place], [place]  [address] ? [address] [address], [place], [place]   [address] °[phone] ? [phone] ? FAX [phone] °Web site:   [email or web address] °

## 2014-12-13 NOTE — Discharge Summary (Signed)
   Patient ID: Ebony Allen [id number] 44 y.o. Mar 11, 1971  12/06/2014  Discharge date and time: 12/13/2014   Admitting Physician: Katharine Look Rivard  Discharge Physician: Excell Seltzer T  Admission Diagnoses: POST OP CONSTIPATION  Discharge Diagnoses: Postop partial small bowel obstruction  Operations: None  Admission Condition: fair  Discharged Condition: good  Indication for Admission: Patient is a 44 year old female approximately one month following robotic myomectomy. The patient initially had a routine uncomplicated postoperative course but developed progressive abdominal distention and nausea and was readmitted with an initial diagnosis of presumed ileus.    Hospital Course: She was treated with NG tube and bowel rest but continued to have distention and high NG output  Without bowel function and Gen. Surgery was consulted to assist with management. A CT scan of the abdomen was obtained which showed a definite transition point in the pelvis with high-grade partial small bowel obstruction. The patient however continued to have a nontender abdomen and normal white blood count and was gradually feeling better at this time. We continued nonoperative observation and over the next several days she began to have flatus and then multiple loose bowel movements. Follow-up abdominal x-rays showed significant improvement in her small bowel distention and increasing gas in the colon. On the day of discharge she has been having loose bowel movements and flatus for 2 days and tolerating a full liquid diet without any bloating or nausea or pain. Her abdomen is soft and nontender. Wounds are healing well. She is very anxious for discharge due to a job interview tomorrow and she appears appropriate for discharge.  Consults: general surgery  Significant Diagnostic Studies: radiology: CT scan: showing high-grade partial small bowel obstruction  Treatments: IV hydration and Bowel rest  Disposition:  Home  Patient Instructions:    Medication List    STOP taking these medications        ondansetron 4 MG tablet  Commonly known as:  ZOFRAN     oxyCODONE-acetaminophen 5-325 MG per tablet  Commonly known as:  PERCOCET/ROXICET     pantoprazole 40 MG tablet  Commonly known as:  PROTONIX      TAKE these medications        HAIR/SKIN/NAILS PO  Take 1 tablet by mouth daily.     ibuprofen 200 MG tablet  Commonly known as:  ADVIL,MOTRIN  Take 200 mg by mouth every 6 (six) hours as needed for moderate pain (Pt takes in addition to 600mg  tablet).     ibuprofen 600 MG tablet  Commonly known as:  ADVIL,MOTRIN  1  po  pc with 6 ounces of water every 6 hours for 5 days then prn-pain     omeprazole 20 MG tablet  Commonly known as:  PRILOSEC OTC  Take 20 mg by mouth daily.     promethazine 25 MG tablet  Commonly known as:  PHENERGAN  Take 1 tablet (25 mg total) by mouth every 6 (six) hours as needed for nausea or vomiting.     Vitamin D 2000 UNITS Caps  Take 1 capsule by mouth daily.        Activity: activity as tolerated Diet: full liquids for approximately 5 days then gradually advance diet as tolerated Wound Care: none needed  Follow-up:  With Dr Cletis Media in 1 week.  Signed: Edward Jolly MD, FACS  12/13/2014, 9:58 AM

## 2014-12-13 NOTE — Progress Notes (Signed)
Discharge instructions discussed with pt and pt's mother including diet progression and when to call md.

## 2014-12-13 NOTE — Progress Notes (Signed)
Patient ID: Ebony Allen, female   DOB: 1970/10/20, 44 y.o.   MRN: [id number]    Subjective: She continues to feel well. Denies abdominal pain. Tolerating full liquids without any difficulty. She has continued to pass gas and has had several more loose bowel movements. She is very anxious to go home.  Objective: Vital signs in last 24 hours: Temp:  [97.6 F (36.4 C)-99 F (37.2 C)] 99 F (37.2 C) (03/20 0615) Pulse Rate:  [59-76] 73 (03/20 0615) Resp:  [16-18] 16 (03/20 0615) BP: (123-131)/(76-82) 130/80 mmHg (03/20 0615) SpO2:  [99 %-100 %] 99 % (03/20 0615) Last BM Date: 14-Dec-2014  Intake/Output from previous day: December 14, 2022 0701 - 03/20 0700 In: 2223.3 [P.O.:360; I.V.:1863.3] Out: 400 [Urine:400] Intake/Output this shift:    General appearance: alert, cooperative and no distress GI: normal findings: soft, non-tender and nondistended  Lab Results:   Recent Labs  12/11/14 0604  WBC 6.1  HGB 12.5  HCT 39.2  PLT 264   BMET  Recent Labs  12/11/14 0604  NA 135  K 4.4  CL 105  CO2 19  GLUCOSE 112*  BUN 11  CREATININE 0.82  CALCIUM 8.4     Studies/Results: Dg Abd 2 Views  12/14/2014   CLINICAL DATA:  Subsequent evaluation for small bowel obstruction  EXAM: ABDOMEN - 2 VIEW  COMPARISON:  12/11/2014  FINDINGS: Nasogastric tube projects over the anticipated position of the antrum/pylorus. There is no free air. There are numerous air-fluid levels throughout the small bowel. There is small volume of gas into the proximal colon. When compared to the prior study, the degree of small bowel distention has decreased. The volume of air into the colon has increased.  IMPRESSION: Persistent small bowel obstruction mildly improved, in that the degree of small bowel distention has decreased and a small volume of air has passed into the large bowel.   Electronically Signed   By: Skipper Cliche M.D.   On: 12-14-2014 10:03    Anti-infectives: Anti-infectives    None       Assessment/Plan: Postoperative small bowel obstruction that clinically is resolving. Her x-rays yesterday showed some persistent small bowel dilatation but clear improvement. Clinically she is dramatically improved. 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 I think this is resolving and she is okay for discharge. I recommended continuing a full liquid diet only for 5 days and then gradually advance. She has follow-up arranged with Dr. Cletis Media this week and I will be available as necessary.    LOS: 7 days    Loretta Kluender T 12/13/2014

## 2015-04-07 ENCOUNTER — Telehealth: Payer: Self-pay | Admitting: Internal Medicine

## 2015-04-07 NOTE — Telephone Encounter (Signed)
Rec'd from [place] forward 4 pages to Dr. Doug Sou

## 2015-05-11 ENCOUNTER — Other Ambulatory Visit: Payer: Self-pay | Admitting: Obstetrics and Gynecology

## 2015-05-14 ENCOUNTER — Other Ambulatory Visit (HOSPITAL_COMMUNITY): Payer: Self-pay | Admitting: Obstetrics and Gynecology

## 2015-05-14 ENCOUNTER — Telehealth: Payer: Self-pay | Admitting: Internal Medicine

## 2015-05-14 NOTE — H&P (Signed)
Ebony Allen is a 44 y.o. female P: 0-0-1-0 who presents for hysteroscopic removal of an endometrial mass.  The patient underwent a robot assisted myomectomy along with hysteroscopic myomectomy in February 2016 with improvement of her menorrhagia and until June.  At that time she began to experience very heavy bleeding with clots on day 3 of her 5 day flow during which required the change of a pad and tampon every 2 hours compared to a pad every 3 hours the remaining days.  She admits to cramps rated as 6/10 on a 10 point pain scale but no inter- menstrual bleeding, dyspareunia or bowel/bladder changes.   A CBC in July was normal with her hemoglobin = 13.5. A pelvic ultrasound at that same times showed: uterus-8.04 x 8.63 x 6.49 cm, endometrium-5.48 mm with a left posterior intramural fibroid-4.0 x 3.5 x 4.6 cm and submucosal mass in endometrium-on right cavity wall by 3D imaging,  (with a feeding vessel by color Doppler) measuring 1.4 x 1.2 x 1.6 cm.  Small fluid in the endometrial cavity outlines the border of this mass and appears to be a submucosal fibroid.   Given the findings on ultrasound and the patient's presenting symptoms,   she desires surgical management of this mass.   Past Medical History  OB History: G: 1  P: 0-0-1-0  GYN History: menarche: 44 YO    LMP: 04/23/15    Contracepton: none  Denies history of abnormal PAP smear.  Last PAP smear: 2013  Medical History: Anemia and GERD  Surgical History:  2016  Robot Assisted/ Hysteroscopic  Myomectomy    2013 Esophageal Dilatation Denies problems with anesthesia or history of blood transfusions  Family History: Diabets Mellitus and Myasthenia Gravis  Social History: Single and employed as a Professor;  Denies tobacco use but occasionally consumes alcohol   Medications:  Multivitamin daily Vitamin D daily Betamethosone 0.1%  bid x 14 days Fish Oil daily Zinc   daily Integra 125-40-30  daily  Allergies  Allergen Reactions  . Nickel  Rash  Latex  causes hives  Denies sensitivity to peanuts, shellfish, soy or adhesives.   ROS: Admits to occasional headaches and seasonal pruritic, papular rash in left antecubital region,  but denies corrective lenses, vision changes, nasal congestion, dysphagia, tinnitus, dizziness, hoarseness, cough,  chest pain, shortness of breath, nausea, vomiting, diarrhea,constipation,  urinary frequency, urgency  dysuria, hematuria, vaginitis symptoms, pelvic pain, swelling of joints,easy bruising,  myalgias, arthralgias, skin rashes, unexplained weight loss and except as is mentioned in the history of present illness, patient's review of systems is otherwise negative.   Physical Exam  Bp: 100/60   P: 68  R: 20   Temperature: 98.1 degrees F orally Weight: 136 lbs.  Height: 5'6"  BMI: 24.1  Neck: supple without masses or thyromegaly Lungs: clear to auscultation Heart: regular rate and rhythm Abdomen: soft, non-tender and no organomegaly Pelvic:EGBUS- wnl; vagina-normal rugae; uterus: 8 weeks size, cervix without lesions or motion tenderness; adnexae-no tenderness or masses Extremities:  no clubbing, cyanosis or edema   Assesment: Menorrhagia           Submucosal Fibroid   Disposition:  A discussion was held with patient regarding the indication for her procedure(s) along with the risks, which include but are not limited to: reaction to anesthesia, damage to adjacent organs, infection and excessive bleeding. The patient verbalized understanding of these risks and has consented to proceed with a Hysteroscopic Myomectomy with Possible Versapoint, Possible Myosure  at [facility]  of [place] on May 20, 2015 .   CSN# [id number]   Brannen Koppen J. Florene Glen, PA-C  for Dr. Dede Query. Rivard

## 2015-05-14 NOTE — Telephone Encounter (Signed)
Rec'd records from [place] 4 page's to [employer]

## 2015-05-17 ENCOUNTER — Encounter (HOSPITAL_COMMUNITY): Payer: Self-pay | Admitting: *Deleted

## 2015-05-20 ENCOUNTER — Ambulatory Visit (HOSPITAL_COMMUNITY)
Admission: RE | Admit: 2015-05-20 | Discharge: 2015-05-20 | Disposition: A | Discharge status: Home / Self Care | Payer: BC Managed Care – PPO | Source: Ambulatory Visit | Attending: Obstetrics and Gynecology | Admitting: Obstetrics and Gynecology

## 2015-05-20 ENCOUNTER — Ambulatory Visit (HOSPITAL_COMMUNITY): Payer: BC Managed Care – PPO | Admitting: Anesthesiology

## 2015-05-20 ENCOUNTER — Encounter (HOSPITAL_COMMUNITY)
Admission: RE | Disposition: A | Discharge status: Home / Self Care | Payer: Self-pay | Source: Ambulatory Visit | Attending: Obstetrics and Gynecology

## 2015-05-20 ENCOUNTER — Encounter (HOSPITAL_COMMUNITY): Payer: Self-pay | Admitting: *Deleted

## 2015-05-20 DIAGNOSIS — D250 Submucous leiomyoma of uterus: Secondary | ICD-10-CM | POA: Diagnosis not present

## 2015-05-20 DIAGNOSIS — K219 Gastro-esophageal reflux disease without esophagitis: Secondary | ICD-10-CM | POA: Insufficient documentation

## 2015-05-20 DIAGNOSIS — N920 Excessive and frequent menstruation with regular cycle: Secondary | ICD-10-CM | POA: Insufficient documentation

## 2015-05-20 DIAGNOSIS — Z833 Family history of diabetes mellitus: Secondary | ICD-10-CM | POA: Diagnosis not present

## 2015-05-20 DIAGNOSIS — D649 Anemia, unspecified: Secondary | ICD-10-CM | POA: Diagnosis not present

## 2015-05-20 DIAGNOSIS — Z8489 Family history of other specified conditions: Secondary | ICD-10-CM | POA: Diagnosis not present

## 2015-05-20 DIAGNOSIS — Z91040 Latex allergy status: Secondary | ICD-10-CM | POA: Diagnosis not present

## 2015-05-20 DIAGNOSIS — Z79899 Other long term (current) drug therapy: Secondary | ICD-10-CM | POA: Insufficient documentation

## 2015-05-20 DIAGNOSIS — Z91048 Other nonmedicinal substance allergy status: Secondary | ICD-10-CM | POA: Diagnosis not present

## 2015-05-20 HISTORY — PX: DILITATION & CURRETTAGE/HYSTROSCOPY WITH VERSAPOINT RESECTION: SHX5571

## 2015-05-20 LAB — CBC
HCT: 36.7 % (ref 36.0–46.0)
HEMOGLOBIN: 11.9 g/dL — AB (ref 12.0–15.0)
MCH: 29.8 pg (ref 26.0–34.0)
MCHC: 32.4 g/dL (ref 30.0–36.0)
MCV: 91.8 fL (ref 78.0–100.0)
PLATELETS: 263 10*3/uL (ref 150–400)
RBC: 4 MIL/uL (ref 3.87–5.11)
RDW: 13.1 % (ref 11.5–15.5)
WBC: 5.1 10*3/uL (ref 4.0–10.5)

## 2015-05-20 LAB — PREGNANCY, URINE: PREG TEST UR: NEGATIVE

## 2015-05-20 SURGERY — DILATATION & CURETTAGE/HYSTEROSCOPY WITH VERSAPOINT RESECTION
Anesthesia: General

## 2015-05-20 MED ORDER — CHLOROPROCAINE HCL 1 % IJ SOLN
INTRAMUSCULAR | Status: DC | PRN
  Administered 2015-05-20: 10 mL

## 2015-05-20 MED ORDER — ONDANSETRON HCL 4 MG/2ML IJ SOLN
INTRAMUSCULAR | Status: AC
  Filled 2015-05-20: qty 2

## 2015-05-20 MED ORDER — DEXAMETHASONE SODIUM PHOSPHATE 10 MG/ML IJ SOLN
INTRAMUSCULAR | Status: DC | PRN
  Administered 2015-05-20: 4 mg via INTRAVENOUS

## 2015-05-20 MED ORDER — CHLOROPROCAINE HCL 1 % IJ SOLN
INTRAMUSCULAR | Status: AC
  Filled 2015-05-20: qty 30

## 2015-05-20 MED ORDER — GLYCOPYRROLATE 0.2 MG/ML IJ SOLN
INTRAMUSCULAR | Status: DC | PRN
  Administered 2015-05-20: 0.2 mg via INTRAVENOUS

## 2015-05-20 MED ORDER — FENTANYL CITRATE (PF) 100 MCG/2ML IJ SOLN
INTRAMUSCULAR | Status: AC
  Administered 2015-05-20: 25 ug via INTRAVENOUS
  Filled 2015-05-20: qty 2

## 2015-05-20 MED ORDER — ONDANSETRON HCL 4 MG/2ML IJ SOLN
INTRAMUSCULAR | Status: DC | PRN
  Administered 2015-05-20: 4 mg via INTRAVENOUS

## 2015-05-20 MED ORDER — SODIUM CHLORIDE 0.9 % IR SOLN
Status: DC | PRN
  Administered 2015-05-20: 9000 mL

## 2015-05-20 MED ORDER — ACETAMINOPHEN 160 MG/5ML PO SOLN
950.0000 mg | Freq: Once | ORAL | Status: AC
  Administered 2015-05-20: 950 mg via ORAL

## 2015-05-20 MED ORDER — LIDOCAINE HCL (CARDIAC) 20 MG/ML IV SOLN
INTRAVENOUS | Status: DC | PRN
  Administered 2015-05-20: 70 mg via INTRAVENOUS

## 2015-05-20 MED ORDER — DEXAMETHASONE SODIUM PHOSPHATE 4 MG/ML IJ SOLN
INTRAMUSCULAR | Status: AC
  Filled 2015-05-20: qty 1

## 2015-05-20 MED ORDER — MIDAZOLAM HCL 2 MG/2ML IJ SOLN
INTRAMUSCULAR | Status: AC
  Filled 2015-05-20: qty 4

## 2015-05-20 MED ORDER — OXYCODONE-ACETAMINOPHEN 5-325 MG PO TABS: 1.0000 | ORAL_TABLET | ORAL | Status: DC | PRN

## 2015-05-20 MED ORDER — ACETAMINOPHEN 160 MG/5ML PO SOLN
ORAL | Status: AC
  Administered 2015-05-20: 950 mg via ORAL
  Filled 2015-05-20: qty 40.6

## 2015-05-20 MED ORDER — LACTATED RINGERS IV SOLN
INTRAVENOUS | Status: DC
  Administered 2015-05-20 (×2): via INTRAVENOUS

## 2015-05-20 MED ORDER — FENTANYL CITRATE (PF) 100 MCG/2ML IJ SOLN
25.0000 ug | INTRAMUSCULAR | Status: DC | PRN
  Administered 2015-05-20 (×2): 25 ug via INTRAVENOUS

## 2015-05-20 MED ORDER — PROPOFOL 10 MG/ML IV BOLUS
INTRAVENOUS | Status: DC | PRN
  Administered 2015-05-20: 200 mg via INTRAVENOUS

## 2015-05-20 MED ORDER — SCOPOLAMINE 1 MG/3DAYS TD PT72
MEDICATED_PATCH | TRANSDERMAL | Status: AC
  Filled 2015-05-20: qty 1

## 2015-05-20 MED ORDER — FENTANYL CITRATE (PF) 100 MCG/2ML IJ SOLN
INTRAMUSCULAR | Status: AC
  Filled 2015-05-20: qty 4

## 2015-05-20 MED ORDER — SCOPOLAMINE 1 MG/3DAYS TD PT72: 1.0000 | MEDICATED_PATCH | Freq: Once | TRANSDERMAL | Status: DC

## 2015-05-20 MED ORDER — MIDAZOLAM HCL 2 MG/2ML IJ SOLN
INTRAMUSCULAR | Status: DC | PRN
  Administered 2015-05-20: 2 mg via INTRAVENOUS

## 2015-05-20 MED ORDER — LIDOCAINE HCL (CARDIAC) 20 MG/ML IV SOLN
INTRAVENOUS | Status: AC
  Filled 2015-05-20: qty 5

## 2015-05-20 MED ORDER — IBUPROFEN 600 MG PO TABS: 600.0000 mg | ORAL_TABLET | Freq: Four times a day (QID) | ORAL | Status: DC | PRN

## 2015-05-20 MED ORDER — FENTANYL CITRATE (PF) 100 MCG/2ML IJ SOLN
INTRAMUSCULAR | Status: DC | PRN
  Administered 2015-05-20 (×2): 50 ug via INTRAVENOUS

## 2015-05-20 MED ORDER — PROPOFOL 10 MG/ML IV BOLUS
INTRAVENOUS | Status: AC
  Filled 2015-05-20: qty 20

## 2015-05-20 MED ORDER — PROMETHAZINE HCL 25 MG/ML IJ SOLN: 6.2500 mg | INTRAMUSCULAR | Status: DC | PRN

## 2015-05-20 MED ORDER — CEFAZOLIN SODIUM-DEXTROSE 2-3 GM-% IV SOLR
2.0000 g | INTRAVENOUS | Status: AC
  Administered 2015-05-20: 2 g via INTRAVENOUS

## 2015-05-20 MED ORDER — CEFAZOLIN SODIUM-DEXTROSE 2-3 GM-% IV SOLR
INTRAVENOUS | Status: AC
  Filled 2015-05-20: qty 50

## 2015-05-20 SURGICAL SUPPLY — 28 items
BOOTIES KNEE HIGH SLOAN (MISCELLANEOUS) ×4 IMPLANT
CANISTER SUCT 3000ML (MISCELLANEOUS) ×2 IMPLANT
CATH FOLEY LATEX FREE 14FR (CATHETERS) ×2
CATH FOLEY LF 14FR (CATHETERS) IMPLANT
CATH ROBINSON RED A/P 16FR (CATHETERS) ×1 IMPLANT
CLOTH BEACON ORANGE TIMEOUT ST (SAFETY) ×2 IMPLANT
CONTAINER PREFILL 10% NBF 60ML (FORM) ×4 IMPLANT
CORD ACTIVE DISPOSABLE (ELECTRODE)
CORD ELECTRO ACTIVE DISP (ELECTRODE) IMPLANT
DEVICE MYOSURE CLASSIC (MISCELLANEOUS) IMPLANT
DEVICE MYOSURE LITE (MISCELLANEOUS) IMPLANT
ELECT REM PT RETURN 9FT ADLT (ELECTROSURGICAL) ×2
ELECTRODE REM PT RTRN 9FT ADLT (ELECTROSURGICAL) ×1 IMPLANT
ELECTRODE ROLLER VERSAPOINT (ELECTRODE) ×1 IMPLANT
ELECTRODE RT ANGLE VERSAPOINT (CUTTING LOOP) IMPLANT
ELECTRODE TWIZZLE TIP (MISCELLANEOUS) ×1 IMPLANT
GLOVE BIOGEL PI IND STRL 7.0 (GLOVE) ×2 IMPLANT
GLOVE BIOGEL PI INDICATOR 7.0 (GLOVE) ×2
GOWN STRL REUS W/TWL LRG LVL3 (GOWN DISPOSABLE) ×4 IMPLANT
LOOP ANGLED CUTTING 22FR (CUTTING LOOP) IMPLANT
MYOSURE XL FIBROID REM (MISCELLANEOUS)
PACK VAGINAL MINOR WOMEN LF (CUSTOM PROCEDURE TRAY) ×2 IMPLANT
PAD OB MATERNITY 4.3X12.25 (PERSONAL CARE ITEMS) ×2 IMPLANT
SYSTEM TISS REMOVAL MYSR XL RM (MISCELLANEOUS) IMPLANT
TOWEL OR 17X24 6PK STRL BLUE (TOWEL DISPOSABLE) ×2 IMPLANT
TUBING AQUILEX INFLOW (TUBING) ×2 IMPLANT
TUBING AQUILEX OUTFLOW (TUBING) ×2 IMPLANT
WATER STERILE IRR 1000ML POUR (IV SOLUTION) ×2 IMPLANT

## 2015-05-20 NOTE — Anesthesia Postprocedure Evaluation (Signed)
  Anesthesia Post-op Note  Patient: Ebony Allen  Procedure(s) Performed: Procedure(s) (LRB): DILATATION & CURETTAGE/HYSTEROSCOPY WITH VERSAPOINT RESECTION (N/A)  Patient Location: PACU  Anesthesia Type: General  Level of Consciousness: awake and alert   Airway and Oxygen Therapy: Patient Spontanous Breathing  Post-op Pain: mild  Post-op Assessment: Post-op Vital signs reviewed, Patient's Cardiovascular Status Stable, Respiratory Function Stable, Patent Airway and No signs of Nausea or vomiting  Last Vitals:  Filed Vitals:   05/20/15 1730  BP: 111/66  Pulse: 69  Temp:   Resp: 16    Post-op Vital Signs: stable   Complications: No apparent anesthesia complications

## 2015-05-20 NOTE — Transfer of Care (Signed)
Immediate Anesthesia Transfer of Care Note  Patient: Ebony Allen  Procedure(s) Performed: Procedure(s): DILATATION & CURETTAGE/HYSTEROSCOPY WITH MYOSURE (N/A) DILATATION & CURETTAGE/HYSTEROSCOPY WITH VERSAPOINT RESECTION (N/A)  Patient Location: PACU  Anesthesia Type:General  Level of Consciousness: awake, alert  and oriented  Airway & Oxygen Therapy: Patient Spontanous Breathing and Patient connected to nasal cannula oxygen  Post-op Assessment: Report given to RN and Post -op Vital signs reviewed and stable  Post vital signs: Reviewed and stable  Last Vitals:  Filed Vitals:   05/20/15 1343  BP: 105/55  Pulse: 55  Temp: 36.3 C  Resp: 20    Complications: No apparent anesthesia complications

## 2015-05-20 NOTE — Anesthesia Preprocedure Evaluation (Signed)
Anesthesia Evaluation  Patient identified by MRN, date of birth, ID band Patient awake    Reviewed: Allergy & Precautions, NPO status , Patient's Chart, lab work & pertinent test results  History of Anesthesia Complications Negative for: history of anesthetic complications  Airway Mallampati: I  TM Distance: >3 FB Neck ROM: Full    Dental no notable dental hx. (+) Dental Advisory Given   Pulmonary neg pulmonary ROS,  breath sounds clear to auscultation  Pulmonary exam normal       Cardiovascular negative cardio ROS Normal cardiovascular examRhythm:Regular Rate:Normal     Neuro/Psych negative neurological ROS  negative psych ROS   GI/Hepatic Neg liver ROS, GERD-  Medicated and Controlled,  Endo/Other  negative endocrine ROS  Renal/GU negative Renal ROS  negative genitourinary   Musculoskeletal negative musculoskeletal ROS (+)   Abdominal   Peds negative pediatric ROS (+)  Hematology  (+) anemia ,   Anesthesia Other Findings NPO appropriate, allergies reviewed Denies active cardiac or pulmonary symptoms, METS > 4 No recent congestive cough or symptoms of upper respiratory infection No reflux today  Reproductive/Obstetrics negative OB ROS                             Anesthesia Physical  Anesthesia Plan  ASA: II  Anesthesia Plan: General   Post-op Pain Management:    Induction: Intravenous  Airway Management Planned: LMA  Additional Equipment:   Intra-op Plan:   Post-operative Plan: Extubation in OR  Informed Consent: I have reviewed the patients History and Physical, chart, labs and discussed the procedure including the risks, benefits and alternatives for the proposed anesthesia with the patient or authorized representative who has indicated his/her understanding and acceptance.   Dental advisory given  Plan Discussed with: CRNA and Surgeon  Anesthesia Plan Comments:          Anesthesia Quick Evaluation

## 2015-05-20 NOTE — Discharge Instructions (Signed)
POST-OPERATIVE INSTRUCTIONS TO PATIENT  Call [facility]  [phone])  for excessive pain, bleeding or temperature greater than or equal to 100.4 degrees (orally).    No driving for 1 day No sexual activity for 1 week Pain management:  Use Ibuprofen 600 mg every 6 hours for 5 days and then as needed. Use your pain medication as needed to maintain a pain level at or below 3/10 Use Colace 1-2 capsules per day as long as you are using pain  medication to avoid constipation.       Diet: normal  Bathing: may shower day after surgery  No tampon for 1 week  Return to Dr. Cletis Media on 06/02/15 at 4:15 pm  Return to work: 05/25/15    [email or web address] A MD 05/20/2015 2:52 PM

## 2015-05-20 NOTE — Anesthesia Procedure Notes (Signed)
Procedure Name: LMA Insertion Date/Time: 05/20/2015 3:05 PM Performed by: Jonna Munro Pre-anesthesia Checklist: Patient identified, Suction available, Emergency Drugs available, Patient being monitored and Timeout performed Patient Re-evaluated:Patient Re-evaluated prior to inductionOxygen Delivery Method: Circle system utilized Preoxygenation: Pre-oxygenation with 100% oxygen Intubation Type: IV induction Ventilation: Mask ventilation without difficulty LMA Size: 4.0 Placement Confirmation: positive ETCO2 and breath sounds checked- equal and bilateral Tube secured with: Tape Dental Injury: Teeth and Oropharynx as per pre-operative assessment

## 2015-05-20 NOTE — Op Note (Addendum)
Preop diagnosis: menorrhagia with submucosal fibroid  Postop diagnosis: same  Anesthesia: general  Anesthesiologist: Dr. Lyndle Herrlich  Procedure: Hysteroscopy with myomectomy  Surgeon: Dr. Katharine Look Jayvien Rowlette  Procedure: After being informed of the planned procedure with possible complications including bleeding, infection and uterine perforation, informed consent was obtained and patient was taken to or #4.  She was given general anesthesia without complication. She was placed in a dorsal decubitus position, prepped and draped in the sterile fashion and her bladder was emptied with an in and out red rubber catheter. Pelvic exam reveals anteverted uterus, 8 weeks size, mobile with 2 normal adnexa..  A weighted speculum is inserted in the vagina. The cervix was grasped with a tenaculum forcep placed on the anterior lip.We proceed with a paracervical block using 1% Nesacaine, 20 cc. Uterus is sounded at 12. The cervix is then easily dilated using Hegar dilator until # 29. This allows for easy placement of a diagnostic hysteroscope. With perfusion of Normal Saline at a maximum pressure of 90 mmHg, we are able to evaluate the entire uterine cavity.  Observation: We are able to easily visualize both tubal ostia. Mid body we note a 1 cm submucosal fibroid anteriorly. Mid body posterior wall we recognized 3 fibroids with the largest measuring 5 cm, the second one measuring 3 cm and the last one measuring 1 cm. The side the large fibroid on the left lateral wall, we see a thick uterine synechia measuring approximately 1 cm x 2 cm. The lower uterine segment is unremarkable and the cervical canal is normal.  We change our diagnostic hysteroscope for a operative hysteroscope and proceed with the resection of all previously mentioned fibroids including the synechia initially with Myosure rapidly changed for a VersaPoint resector. We then removed our instrumentation.   Instruments are then removed. Instrument and  sponge count is complete x2. Estimated blood loss is 300. Water deficit is 800 cc of NS. After removal of all instruments, we see a constant moderate bleeding coming from the uterine cavity. More debris are removed with a sharp curette. 2 sites of bleeding on the anterior cervical lip are sutured with single suture of 30 Vicryls for hemostasis. We still recognize a moderate amount of bleeding coming from the uterine cavity. A Foley catheter is inserted in the uterine cavity and its balloon is inflated with 40 cc of normal saline. The catheter is attached to a draining bag which now reveals bleeding has almost stopped. Decision is made to transfer the patient to recovery room with the Foley catheter still in the uterus and to reevaluate in 60-90 minutes.  The procedure is very well tolerated by the patient who is taken to recovery room in a well and stable condition.  Specimen: Fragments of fibroids sent to pathology.  Patient up to the bathroom after removal of the uterine Foley with no difficulty voiding and only scant bleeding. Ready for D/C home: D/C instructions reviewed.

## 2015-05-20 NOTE — Interval H&P Note (Signed)
History and Physical Interval Note:  05/20/2015 2:38 PM  Ebony Allen  has presented today for surgery, with the diagnosis of Uterine Fibrods, Menorrhagia  The various methods of treatment have been discussed with the patient and family. After consideration of risks, benefits and other options for treatment, the patient has consented to  Procedure(s): DILATATION & CURETTAGE/HYSTEROSCOPY WITH MYOSURE (N/A) DILATATION & CURETTAGE/HYSTEROSCOPY WITH VERSAPOINT RESECTION (N/A) as a surgical intervention .  The patient's history has been reviewed, patient examined, no change in status, stable for surgery.  I have reviewed the patient's chart and labs.  Questions were answered to the patient's satisfaction.     Mickell Birdwell A

## 2015-05-20 NOTE — H&P (View-Only) (Signed)
Ebony Allen is a 44 y.o. female P: 0-0-1-0 who presents for hysteroscopic removal of an endometrial mass.  The patient underwent a robot assisted myomectomy along with hysteroscopic myomectomy in February 2016 with improvement of her menorrhagia and until June.  At that time she began to experience very heavy bleeding with clots on day 3 of her 5 day flow during which required the change of a pad and tampon every 2 hours compared to a pad every 3 hours the remaining days.  She admits to cramps rated as 6/10 on a 10 point pain scale but no inter- menstrual bleeding, dyspareunia or bowel/bladder changes.   A CBC in July was normal with her hemoglobin = 13.5. A pelvic ultrasound at that same times showed: uterus-8.04 x 8.63 x 6.49 cm, endometrium-5.48 mm with a left posterior intramural fibroid-4.0 x 3.5 x 4.6 cm and submucosal mass in endometrium-on right cavity wall by 3D imaging,  (with a feeding vessel by color Doppler) measuring 1.4 x 1.2 x 1.6 cm.  Small fluid in the endometrial cavity outlines the border of this mass and appears to be a submucosal fibroid.   Given the findings on ultrasound and the patient's presenting symptoms,   she desires surgical management of this mass.   Past Medical History  OB History: G: 1  P: 0-0-1-0  GYN History: menarche: 44 YO    LMP: 04/23/15    Contracepton: none  Denies history of abnormal PAP smear.  Last PAP smear: 2013  Medical History: Anemia and GERD  Surgical History:  2016  Robot Assisted/ Hysteroscopic  Myomectomy    2013 Esophageal Dilatation Denies problems with anesthesia or history of blood transfusions  Family History: Diabets Mellitus and Myasthenia Gravis  Social History: Single and employed as a Professor;  Denies tobacco use but occasionally consumes alcohol   Medications:  Multivitamin daily Vitamin D daily Betamethosone 0.1%  bid x 14 days Fish Oil daily Zinc   daily Integra 125-40-30  daily  Allergies  Allergen Reactions  . Nickel  Rash  Latex  causes hives  Denies sensitivity to peanuts, shellfish, soy or adhesives.   ROS: Admits to occasional headaches and seasonal pruritic, papular rash in left antecubital region,  but denies corrective lenses, vision changes, nasal congestion, dysphagia, tinnitus, dizziness, hoarseness, cough,  chest pain, shortness of breath, nausea, vomiting, diarrhea,constipation,  urinary frequency, urgency  dysuria, hematuria, vaginitis symptoms, pelvic pain, swelling of joints,easy bruising,  myalgias, arthralgias, skin rashes, unexplained weight loss and except as is mentioned in the history of present illness, patient's review of systems is otherwise negative.   Physical Exam  Bp: 100/60   P: 68  R: 20   Temperature: 98.1 degrees F orally Weight: 136 lbs.  Height: 5'6"  BMI: 24.1  Neck: supple without masses or thyromegaly Lungs: clear to auscultation Heart: regular rate and rhythm Abdomen: soft, non-tender and no organomegaly Pelvic:EGBUS- wnl; vagina-normal rugae; uterus: 8 weeks size, cervix without lesions or motion tenderness; adnexae-no tenderness or masses Extremities:  no clubbing, cyanosis or edema   Assesment: Menorrhagia           Submucosal Fibroid   Disposition:  A discussion was held with patient regarding the indication for her procedure(s) along with the risks, which include but are not limited to: reaction to anesthesia, damage to adjacent organs, infection and excessive bleeding. The patient verbalized understanding of these risks and has consented to proceed with a Hysteroscopic Myomectomy with Possible Versapoint, Possible Myosure  at [facility] on May 20, 2015 .   CSN# [id number]   Hevin Jeffcoat J. Florene Glen, PA-C  for Dr. Dede Query. Rivard

## 2015-05-21 ENCOUNTER — Encounter (HOSPITAL_COMMUNITY): Payer: Self-pay | Admitting: Obstetrics and Gynecology

## 2015-06-03 ENCOUNTER — Telehealth: Payer: Self-pay | Admitting: Internal Medicine

## 2015-06-03 NOTE — Telephone Encounter (Signed)
Rec'd from [facility] forward 4 pages to Dr. Doug Sou

## 2015-10-27 ENCOUNTER — Encounter: Payer: Self-pay | Admitting: Internal Medicine

## 2015-10-27 ENCOUNTER — Ambulatory Visit (INDEPENDENT_AMBULATORY_CARE_PROVIDER_SITE_OTHER): Payer: BC Managed Care – PPO | Admitting: Internal Medicine

## 2015-10-27 VITALS — BP 110/76 | HR 105 | Temp 98.6°F | Resp 20 | Wt 143.0 lb

## 2015-10-27 DIAGNOSIS — I889 Nonspecific lymphadenitis, unspecified: Secondary | ICD-10-CM | POA: Insufficient documentation

## 2015-10-27 DIAGNOSIS — R21 Rash and other nonspecific skin eruption: Secondary | ICD-10-CM | POA: Insufficient documentation

## 2015-10-27 MED ORDER — TRIAMCINOLONE ACETONIDE 0.1 % EX CREA: 1.0000 "application " | TOPICAL_CREAM | Freq: Two times a day (BID) | CUTANEOUS | Status: DC

## 2015-10-27 MED ORDER — PREDNISONE 10 MG PO TABS: ORAL_TABLET | ORAL | Status: DC

## 2015-10-27 NOTE — Progress Notes (Signed)
Subjective:    Patient ID: Ebony Allen, female    DOB: [birth year], 45 y.o.   MRN: [id number]  HPI  Here to f/u with c/o recent wearing of a nicklefree necklace last wk, and now 3-4 days onset rash to necklace distribution with itching bump small red areas to back and sides of neck only (no anterior rash not touched by jewelry).  More alarming to her, however is rather dramatic large tender lumps subq curiously symmetric to bilat lateral neck areas as well located at the site of most intense area of rash.  No fever, trauma, recent wt loss or hx of malignancy Past Medical History  Diagnosis Date  . GERD (gastroesophageal reflux disease)   . Anemia    Past Surgical History  Procedure Laterality Date  . Esophageal dilation      Dr Ronalee Red  . Dilatation & curettage/hysteroscopy with myosure N/A 11/17/2014    Procedure: [place];  Surgeon: Delsa Bern, MD;  Location: [facility];  Service: Gynecology;  Laterality: N/A;  . Robot assisted myomectomy N/A 11/17/2014    Procedure: ROBOTIC ASSISTED MYOMECTOMY;  Surgeon: Delsa Bern, MD;  Location: [facility];  Service: Gynecology;  Laterality: N/A;  . Dilitation & currettage/hystroscopy with versapoint resection N/A 05/20/2015    Procedure: DILATATION & CURETTAGE/HYSTEROSCOPY WITH VERSAPOINT RESECTION;  Surgeon: Delsa Bern, MD;  Location: [facility];  Service: Gynecology;  Laterality: N/A;    reports that she has never smoked. She has never used smokeless tobacco. She reports that she drinks alcohol. She reports that she does not use illicit drugs. family history is not on file. Allergies  Allergen Reactions  . Latex Swelling  . Nickel Rash   Current Outpatient Prescriptions on File Prior to Visit  Medication Sig Dispense Refill  . Cholecalciferol (VITAMIN D) 2000 UNITS CAPS Take 1 capsule by mouth daily.    Marland Kitchen ibuprofen (ADVIL,MOTRIN) 600 MG tablet 1  po  pc with 6 ounces of water every 6 hours for 5 days then  prn-pain (Patient taking differently: Take 600 mg by mouth every 6 (six) hours as needed for moderate pain. ) 30 tablet 1  . ibuprofen (ADVIL,MOTRIN) 600 MG tablet Take 1 tablet (600 mg total) by mouth every 6 (six) hours as needed. 30 tablet 0  . Multiple Vitamins-Minerals (HAIR/SKIN/NAILS PO) Take 1 tablet by mouth daily.    Marland Kitchen omeprazole (PRILOSEC OTC) 20 MG tablet Take 20 mg by mouth daily.    Marland Kitchen oxyCODONE-acetaminophen (ROXICET) 5-325 MG per tablet Take 1 tablet by mouth every 4 (four) hours as needed for moderate pain or severe pain. 30 tablet 0  . promethazine (PHENERGAN) 25 MG tablet Take 1 tablet (25 mg total) by mouth every 6 (six) hours as needed for nausea or vomiting. 30 tablet 0   Current Facility-Administered Medications on File Prior to Visit  Medication Dose Route Frequency Provider Last Rate Last Dose  . misoprostol (CYTOTEC) tablet 400 mcg  400 mcg Vaginal Once Earnstine Regal, PA-C       Review of Systems  All otherwise neg per pt      Objective:   Physical Exam BP 110/76 mmHg  Pulse 105  Temp(Src) 98.6 F (37 C) (Oral)  Resp 20  Wt 143 lb (64.864 kg)  SpO2 95% VS noted,  Constitutional: Pt appears in no significant distress HENT: Head: NCAT.  Right Ear: External ear normal.  Left Ear: External ear normal.  Eyes: . Pupils are equal, round, and reactive to light.  Conjunctivae and EOM are normal; pharynx normal without mass or dental swelling Neck: Normal range of motion. Neck supple.  Cardiovascular: Normal rate and regular rhythm.   Pulmonary/Chest: Effort normal and breath sounds without rales or wheezing.  Neurological: Pt is alert. Not confused , motor grossly intact Skin: Skin is warm. no LE edema; pt with approx 1 cm wide "band" like small erythem lesions nontender, slighty raised/bumpy itchy to post and bilat sides of neck only (no ant neck rash); but assoc with areas of tender likely lymphadenitis with subq LN at or just over 1 cm bilat (left size  > right)  located bilat specifically at the mid [facility] post chains; no other LA or neck masses noted Psychiatric: Pt behavior is normal. No agitation.     Assessment & Plan:

## 2015-10-27 NOTE — Progress Notes (Signed)
Pre visit review using our clinic review tool, if applicable. No additional management support is needed unless otherwise documented below in the visit note. 

## 2015-10-27 NOTE — Patient Instructions (Addendum)
You had the steroid shot today  Please take all new medication as prescribed - the prednisone, and the steroid cream if needed  Please return in 1 - 2 weeks if not improved  Please continue all other medications as before, and refills have been done if requested.  Please have the pharmacy call with any other refills you may need.  Please keep your appointments with your specialists as you may have planned

## 2015-10-27 NOTE — Assessment & Plan Note (Signed)
This is c/w jewelry related contact dermatitis, rather large and dramatic area, for depomedrol IM, predpac asd, triam cr asd, benadryl cream prn, avoid further similar jewelry

## 2015-10-27 NOTE — Assessment & Plan Note (Signed)
bilat neck at mid SCM post chains in symmetric fashion - likely reactive and not infectious related, for tx as above, for antibx if has fever, consider ENT and/or CT neck for persistent or worsening

## 2015-11-18 ENCOUNTER — Ambulatory Visit (INDEPENDENT_AMBULATORY_CARE_PROVIDER_SITE_OTHER): Payer: BC Managed Care – PPO | Admitting: Family

## 2015-11-18 ENCOUNTER — Encounter: Payer: Self-pay | Admitting: Family

## 2015-11-18 VITALS — BP 100/70 | HR 67 | Temp 98.0°F | Resp 16 | Ht 63.0 in | Wt 136.4 lb

## 2015-11-18 DIAGNOSIS — H6123 Impacted cerumen, bilateral: Secondary | ICD-10-CM

## 2015-11-18 NOTE — Assessment & Plan Note (Signed)
Impacted cerumen noted bilaterally. Both ears were cleaned out with warm water mixed with Docusate Sodium. There was wax return from both ears and ear drum was visible. Patient tolerated this well. Discussed ear wax prevention. Follow up if symptoms return.

## 2015-11-18 NOTE — Patient Instructions (Signed)
Thank you for choosing [facility].  Summary/Instructions:   If your symptoms worsen or fail to improve, please contact our office for further instruction, or in case of emergency go directly to the emergency room at the closest medical facility.    Cerumen Impaction The structures of the external ear canal secrete a waxy substance known as cerumen. Excess cerumen can build up in the ear canal, causing a condition known as cerumen impaction. Cerumen impaction can cause ear pain and disrupt the function of the ear. The rate of cerumen production differs for each individual. In certain individuals, the configuration of the ear canal may decrease his or her ability to naturally remove cerumen. CAUSES Cerumen impaction is caused by excessive cerumen production or buildup. RISK FACTORS 1. Frequent use of swabs to clean ears. 2. Having narrow ear canals. 3. Having eczema. 4. Being dehydrated. SIGNS AND SYMPTOMS 1. Diminished hearing. 2. Ear drainage. 3. Ear pain. 4. Ear itch. TREATMENT Treatment may involve: 1. Over-the-counter or prescription ear drops to soften the cerumen. 2. Removal of cerumen by a health care provider. This may be done with: 1. Irrigation with warm water. This is the most common method of removal. 2. Ear curettes and other instruments. 3. Surgery. This may be done in severe cases. HOME CARE INSTRUCTIONS 1. Take medicines only as directed by your health care provider. 2. Do not insert objects into the ear with the intent of cleaning the ear. PREVENTION 1. Do not insert objects into the ear, even with the intent of cleaning the ear. Removing cerumen as a part of normal hygiene is not necessary, and the use of swabs in the ear canal is not recommended. 2. Drink enough water to keep your urine clear or pale yellow. 3. Control your eczema if you have it. SEEK MEDICAL CARE IF:  You develop ear pain.  You develop bleeding from the ear.  The cerumen does not  clear after you use ear drops as directed.   This information is not intended to replace advice given to you by your health care provider. Make sure you discuss any questions you have with your health care provider.   Document Released: 10/19/2004 Document Revised: 10/02/2014 Document Reviewed: 04/28/2015 Elsevier Interactive Patient Education 2016 [employer].  To prevent wax buildup within the ear:   Use equal parts of water and white vinegar  Soak a cotton ball in the solution and place several drops within the ear  Insert cotton ball in external ear canal and let sit for 30 minutes prior to shower  Remove cotton ball and gently irrigate the ear canal in the shower.  Do not irrigate directly into the ear but rather let it hit the external canal and irrigate.  For maintenance, this can be done 1 time weekly.

## 2015-11-18 NOTE — Progress Notes (Signed)
Pre visit review using our clinic review tool, if applicable. No additional management support is needed unless otherwise documented below in the visit note. 

## 2015-11-18 NOTE — Progress Notes (Signed)
Subjective:    Patient ID: [name], female    DOB: March 04, 1971, 45 y.o.   MRN: [id number]  Chief Complaint  Patient presents with  . Ear issues    ears feel full like water is in them, no pain associated    HPI:  Ebony Allen is a 45 y.o. female who  has a past medical history of GERD (gastroesophageal reflux disease) and Anemia. and presents today for an acute office visit.   1.) Ear issue - This is a new problem. Associated symptom of ear fullness located bilaterally described as feeling like water in them has been going on for several days. Does have a small decrease in hearing. Denies pain or discharge.   Allergies  Allergen Reactions  . Latex Swelling  . Nickel Rash     Current Outpatient Prescriptions on File Prior to Visit  Medication Sig Dispense Refill  . Cholecalciferol (VITAMIN D) 2000 UNITS CAPS Take 1 capsule by mouth daily.    Marland Kitchen ibuprofen (ADVIL,MOTRIN) 600 MG tablet 1  po  pc with 6 ounces of water every 6 hours for 5 days then prn-pain (Patient taking differently: Take 600 mg by mouth every 6 (six) hours as needed for moderate pain. ) 30 tablet 1  . ibuprofen (ADVIL,MOTRIN) 600 MG tablet Take 1 tablet (600 mg total) by mouth every 6 (six) hours as needed. 30 tablet 0  . Multiple Vitamins-Minerals (HAIR/SKIN/NAILS PO) Take 1 tablet by mouth daily.    Marland Kitchen omeprazole (PRILOSEC OTC) 20 MG tablet Take 20 mg by mouth daily.    Marland Kitchen oxyCODONE-acetaminophen (ROXICET) 5-325 MG per tablet Take 1 tablet by mouth every 4 (four) hours as needed for moderate pain or severe pain. 30 tablet 0  . predniSONE (DELTASONE) 10 MG tablet 3 tabs by mouth per day for 3 days,2tabs per day for 3 days,1tab per day for 3 days 18 tablet 0  . promethazine (PHENERGAN) 25 MG tablet Take 1 tablet (25 mg total) by mouth every 6 (six) hours as needed for nausea or vomiting. 30 tablet 0  . triamcinolone cream (KENALOG) 0.1 % Apply 1 application topically 2 (two) times daily. 30 g 0   Current  Facility-Administered Medications on File Prior to Visit  Medication Dose Route Frequency Provider Last Rate Last Dose  . misoprostol (CYTOTEC) tablet 400 mcg  400 mcg Vaginal Once Earnstine Regal, PA-C        Review of Systems  Constitutional: Negative for fever and chills.  HENT: Negative for ear discharge and ear pain.        Positive for decreased hearing.      Objective:    BP 100/70 mmHg  Pulse 67  Temp(Src) 98 F (36.7 C) (Oral)  Resp 16  Ht 5\' 3"  (1.6 m)  Wt 136 lb 6.4 oz (61.871 kg)  BMI 24.17 kg/m2  SpO2 98% Nursing note and vital signs reviewed.  Physical Exam  Constitutional: She is oriented to person, place, and time. She appears well-developed and well-nourished. No distress.  HENT:  Impacted cerumen noted bilaterally.   Cardiovascular: Normal rate, regular rhythm, normal heart sounds and intact distal pulses.   Pulmonary/Chest: Effort normal and breath sounds normal.  Neurological: She is alert and oriented to person, place, and time.  Skin: Skin is warm and dry.  Psychiatric: She has a normal mood and affect. Her behavior is normal. Judgment and thought content normal.       Assessment & Plan:   Problem List Items Addressed  This Visit      Nervous and Auditory   Impacted cerumen of both ears - Primary     Impacted cerumen noted bilaterally. Both ears were cleaned out with warm water mixed with Docusate Sodium. There was wax return from both ears and ear drum was visible. Patient tolerated this well. Discussed ear wax prevention. Follow up if symptoms return.

## 2015-12-10 ENCOUNTER — Encounter: Payer: BC Managed Care – PPO | Admitting: Internal Medicine

## 2016-01-11 ENCOUNTER — Ambulatory Visit (INDEPENDENT_AMBULATORY_CARE_PROVIDER_SITE_OTHER): Payer: 59 | Admitting: Internal Medicine

## 2016-01-11 ENCOUNTER — Encounter: Payer: Self-pay | Admitting: Internal Medicine

## 2016-01-11 VITALS — BP 106/70 | HR 86 | Temp 98.3°F | Resp 12 | Ht 64.0 in | Wt 136.0 lb

## 2016-01-11 DIAGNOSIS — Z23 Encounter for immunization: Secondary | ICD-10-CM | POA: Diagnosis not present

## 2016-01-11 DIAGNOSIS — Z9109 Other allergy status, other than to drugs and biological substances: Secondary | ICD-10-CM

## 2016-01-11 DIAGNOSIS — Z0000 Encounter for general adult medical examination without abnormal findings: Secondary | ICD-10-CM

## 2016-01-11 DIAGNOSIS — Z91048 Other nonmedicinal substance allergy status: Secondary | ICD-10-CM

## 2016-01-11 NOTE — Addendum Note (Signed)
Addended by: Resa Miner R on: 01/11/2016 02:56 PM   Modules accepted: Orders

## 2016-01-11 NOTE — Patient Instructions (Signed)
We will have the allergy office call you to schedule to get the skin testing for allergies.   We have given you the tetanus shot today which is good for 10 years.   Keep up the good work with your health and come back in about 1-2 years for a check up.   Health Maintenance, Female Adopting a healthy lifestyle and getting preventive care can go a long way to promote health and wellness. Talk with your health care provider about what schedule of regular examinations is right for you. This is a good chance for you to check in with your provider about disease prevention and staying healthy. In between checkups, there are plenty of things you can do on your own. Experts have done a lot of research about which lifestyle changes and preventive measures are most likely to keep you healthy. Ask your health care provider for more information. WEIGHT AND DIET  Eat a healthy diet  Be sure to include plenty of vegetables, fruits, low-fat dairy products, and lean protein.  Do not eat a lot of foods high in solid fats, added sugars, or salt.  Get regular exercise. This is one of the most important things you can do for your health.  Most adults should exercise for at least 150 minutes each week. The exercise should increase your heart rate and make you sweat (moderate-intensity exercise).  Most adults should also do strengthening exercises at least twice a week. This is in addition to the moderate-intensity exercise.  Maintain a healthy weight  Body mass index (BMI) is a measurement that can be used to identify possible weight problems. It estimates body fat based on height and weight. Your health care provider can help determine your BMI and help you achieve or maintain a healthy weight.  For females 56 years of age and older:   A BMI below 18.5 is considered underweight.  A BMI of 18.5 to 24.9 is normal.  A BMI of 25 to 29.9 is considered overweight.  A BMI of 30 and above is considered obese.   Watch levels of cholesterol and blood lipids  You should start having your blood tested for lipids and cholesterol at 46 years of age, then have this test every 5 years.  You may need to have your cholesterol levels checked more often if:  Your lipid or cholesterol levels are high.  You are older than 46 years of age.  You are at high risk for heart disease.  CANCER SCREENING   Lung Cancer  Lung cancer screening is recommended for adults 66-32 years old who are at high risk for lung cancer because of a history of smoking.  A yearly low-dose CT scan of the lungs is recommended for people who:  Currently smoke.  Have quit within the past 15 years.  Have at least a 30-pack-year history of smoking. A pack year is smoking an average of one pack of cigarettes a day for 1 year.  Yearly screening should continue until it has been 15 years since you quit.  Yearly screening should stop if you develop a health problem that would prevent you from having lung cancer treatment.  Breast Cancer  Practice breast self-awareness. This means understanding how your breasts normally appear and feel.  It also means doing regular breast self-exams. Let your health care provider know about any changes, no matter how small.  If you are in your 20s or 30s, you should have a clinical breast exam (CBE) by a  health care provider every 1-3 years as part of a regular health exam.  If you are 40 or older, have a CBE every year. Also consider having a breast X-ray (mammogram) every year.  If you have a family history of breast cancer, talk to your health care provider about genetic screening.  If you are at high risk for breast cancer, talk to your health care provider about having an MRI and a mammogram every year.  Breast cancer gene (BRCA) assessment is recommended for women who have family members with BRCA-related cancers. BRCA-related cancers  include:  Breast.  Ovarian.  Tubal.  Peritoneal cancers.  Results of the assessment will determine the need for genetic counseling and BRCA1 and BRCA2 testing. Cervical Cancer Your health care provider may recommend that you be screened regularly for cancer of the pelvic organs (ovaries, uterus, and vagina). This screening involves a pelvic examination, including checking for microscopic changes to the surface of your cervix (Pap test). You may be encouraged to have this screening done every 3 years, beginning at age 21.  For women ages 30-65, health care providers may recommend pelvic exams and Pap testing every 3 years, or they may recommend the Pap and pelvic exam, combined with testing for human papilloma virus (HPV), every 5 years. Some types of HPV increase your risk of cervical cancer. Testing for HPV may also be done on women of any age with unclear Pap test results.  Other health care providers may not recommend any screening for nonpregnant women who are considered low risk for pelvic cancer and who do not have symptoms. Ask your health care provider if a screening pelvic exam is right for you.  If you have had past treatment for cervical cancer or a condition that could lead to cancer, you need Pap tests and screening for cancer for at least 20 years after your treatment. If Pap tests have been discontinued, your risk factors (such as having a new sexual partner) need to be reassessed to determine if screening should resume. Some women have medical problems that increase the chance of getting cervical cancer. In these cases, your health care provider may recommend more frequent screening and Pap tests. Colorectal Cancer  This type of cancer can be detected and often prevented.  Routine colorectal cancer screening usually begins at 46 years of age and continues through 46 years of age.  Your health care provider may recommend screening at an earlier age if you have risk factors for  colon cancer.  Your health care provider may also recommend using home test kits to check for hidden blood in the stool.  A small camera at the end of a tube can be used to examine your colon directly (sigmoidoscopy or colonoscopy). This is done to check for the earliest forms of colorectal cancer.  Routine screening usually begins at age 50.  Direct examination of the colon should be repeated every 5-10 years through 46 years of age. However, you may need to be screened more often if early forms of precancerous polyps or small growths are found. Skin Cancer  Check your skin from head to toe regularly.  Tell your health care provider about any new moles or changes in moles, especially if there is a change in a mole's shape or color.  Also tell your health care provider if you have a mole that is larger than the size of a pencil eraser.  Always use sunscreen. Apply sunscreen liberally and repeatedly throughout the day.    Protect yourself by wearing long sleeves, pants, a wide-brimmed hat, and sunglasses whenever you are outside. HEART DISEASE, DIABETES, AND HIGH BLOOD PRESSURE   High blood pressure causes heart disease and increases the risk of stroke. High blood pressure is more likely to develop in:  People who have blood pressure in the high end of the normal range (130-139/85-89 mm Hg).  People who are overweight or obese.  People who are African American.  If you are 18-39 years of age, have your blood pressure checked every 3-5 years. If you are 40 years of age or older, have your blood pressure checked every year. You should have your blood pressure measured twice--once when you are at a hospital or clinic, and once when you are not at a hospital or clinic. Record the average of the two measurements. To check your blood pressure when you are not at a hospital or clinic, you can use:  An automated blood pressure machine at a pharmacy.  A home blood pressure monitor.  If you  are between 55 years and 79 years old, ask your health care provider if you should take aspirin to prevent strokes.  Have regular diabetes screenings. This involves taking a blood sample to check your fasting blood sugar level.  If you are at a normal weight and have a low risk for diabetes, have this test once every three years after 45 years of age.  If you are overweight and have a high risk for diabetes, consider being tested at a younger age or more often. PREVENTING INFECTION  Hepatitis B  If you have a higher risk for hepatitis B, you should be screened for this virus. You are considered at high risk for hepatitis B if:  You were born in a country where hepatitis B is common. Ask your health care provider which countries are considered high risk.  Your parents were born in a high-risk country, and you have not been immunized against hepatitis B (hepatitis B vaccine).  You have HIV or AIDS.  You use needles to inject street drugs.  You live with someone who has hepatitis B.  You have had sex with someone who has hepatitis B.  You get hemodialysis treatment.  You take certain medicines for conditions, including cancer, organ transplantation, and autoimmune conditions. Hepatitis C  Blood testing is recommended for:  Everyone born from 1945 through 1965.  Anyone with known risk factors for hepatitis C. Sexually transmitted infections (STIs)  You should be screened for sexually transmitted infections (STIs) including gonorrhea and chlamydia if:  You are sexually active and are younger than 46 years of age.  You are older than 46 years of age and your health care provider tells you that you are at risk for this type of infection.  Your sexual activity has changed since you were last screened and you are at an increased risk for chlamydia or gonorrhea. Ask your health care provider if you are at risk.  If you do not have HIV, but are at risk, it may be recommended that you  take a prescription medicine daily to prevent HIV infection. This is called pre-exposure prophylaxis (PrEP). You are considered at risk if:  You are sexually active and do not regularly use condoms or know the HIV status of your partner(s).  You take drugs by injection.  You are sexually active with a partner who has HIV. Talk with your health care provider about whether you are at high risk of being infected   with HIV. If you choose to begin PrEP, you should first be tested for HIV. You should then be tested every 3 months for as long as you are taking PrEP.  PREGNANCY   If you are premenopausal and you may become pregnant, ask your health care provider about preconception counseling.  If you may become pregnant, take 400 to 800 micrograms (mcg) of folic acid every day.  If you want to prevent pregnancy, talk to your health care provider about birth control (contraception). OSTEOPOROSIS AND MENOPAUSE   Osteoporosis is a disease in which the bones lose minerals and strength with aging. This can result in serious bone fractures. Your risk for osteoporosis can be identified using a bone density scan.  If you are 28 years of age or older, or if you are at risk for osteoporosis and fractures, ask your health care provider if you should be screened.  Ask your health care provider whether you should take a calcium or vitamin D supplement to lower your risk for osteoporosis.  Menopause may have certain physical symptoms and risks.  Hormone replacement therapy may reduce some of these symptoms and risks. Talk to your health care provider about whether hormone replacement therapy is right for you.  HOME CARE INSTRUCTIONS   Schedule regular health, dental, and eye exams.  Stay current with your immunizations.   Do not use any tobacco products including cigarettes, chewing tobacco, or electronic cigarettes.  If you are pregnant, do not drink alcohol.  If you are breastfeeding, limit how  much and how often you drink alcohol.  Limit alcohol intake to no more than 1 drink per day for nonpregnant women. One drink equals 12 ounces of beer, 5 ounces of wine, or 1 ounces of hard liquor.  Do not use street drugs.  Do not share needles.  Ask your health care provider for help if you need support or information about quitting drugs.  Tell your health care provider if you often feel depressed.  Tell your health care provider if you have ever been abused or do not feel safe at home.   This information is not intended to replace advice given to you by your health care provider. Make sure you discuss any questions you have with your health care provider.   Document Released: 03/27/2011 Document Revised: 10/02/2014 Document Reviewed: 08/13/2013 Elsevier Interactive Patient Education [employer].

## 2016-01-11 NOTE — Assessment & Plan Note (Signed)
Tdap given today to update immunizations. Pap smear normal and up to date (sees gyn for mammogram). No indication for early colonoscopy. Labs reviewed and no indication for change.

## 2016-01-11 NOTE — Progress Notes (Signed)
   Subjective:    Patient ID: Ebony Allen, female    DOB: [birth year], 45 y.o.   MRN: [id number]  HPI The patient is a 45 YO female coming in for wellness. No new concerns but having more problems with allergies and wants to get tested.   PMH, [facility], social history reviewed and updated.   Review of Systems  Constitutional: Negative for fever, activity change, appetite change and fatigue.  HENT: Negative.   Eyes: Negative.   Respiratory: Negative for cough, chest tightness, shortness of breath and wheezing.   Cardiovascular: Negative for chest pain, palpitations and leg swelling.  Gastrointestinal: Negative for abdominal pain, diarrhea, constipation and abdominal distention.  Genitourinary: Negative.   Musculoskeletal: Negative.   Skin: Negative.   Neurological: Negative.       Objective:   Physical Exam  Constitutional: She is oriented to person, place, and time. She appears well-developed and well-nourished.  HENT:  Head: Normocephalic and atraumatic.  Eyes: EOM are normal.  Neck: Normal range of motion.  Cardiovascular: Normal rate and regular rhythm.   Pulmonary/Chest: Effort normal and breath sounds normal. No respiratory distress. She has no wheezes. She has no rales.  Abdominal: Soft. Bowel sounds are normal. She exhibits no distension. There is no tenderness. There is no rebound.  Neurological: She is alert and oriented to person, place, and time. Coordination normal.  Skin: Skin is warm and dry.  Vitals reviewed.  Filed Vitals:   01/11/16 1429  BP: 106/70  Pulse: 86  Temp: 98.3 F (36.8 C)  TempSrc: Oral  Resp: 12  Height: 5\' 4"  (1.626 m)  Weight: 136 lb (61.689 kg)  SpO2: 98%      Assessment & Plan:  Tdap given at visit

## 2016-01-11 NOTE — Progress Notes (Signed)
Pre visit review using our clinic review tool, if applicable. No additional management support is needed unless otherwise documented below in the visit note. 

## 2016-01-14 ENCOUNTER — Encounter: Payer: Self-pay | Admitting: Internal Medicine

## 2016-02-22 ENCOUNTER — Telehealth: Payer: Self-pay | Admitting: Internal Medicine
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
## 2016-06-12 IMAGING — DX DG ABDOMEN 2V
2 series · 2 of 2 positions shown · non-contrast
Comparison: Earlier same date

CLINICAL DATA: Patient status post robotic myomectomy. Diffuse
abdominal pain.

EXAM:
ABDOMEN - 2 VIEW

[abdomen erect (1 of 2)]
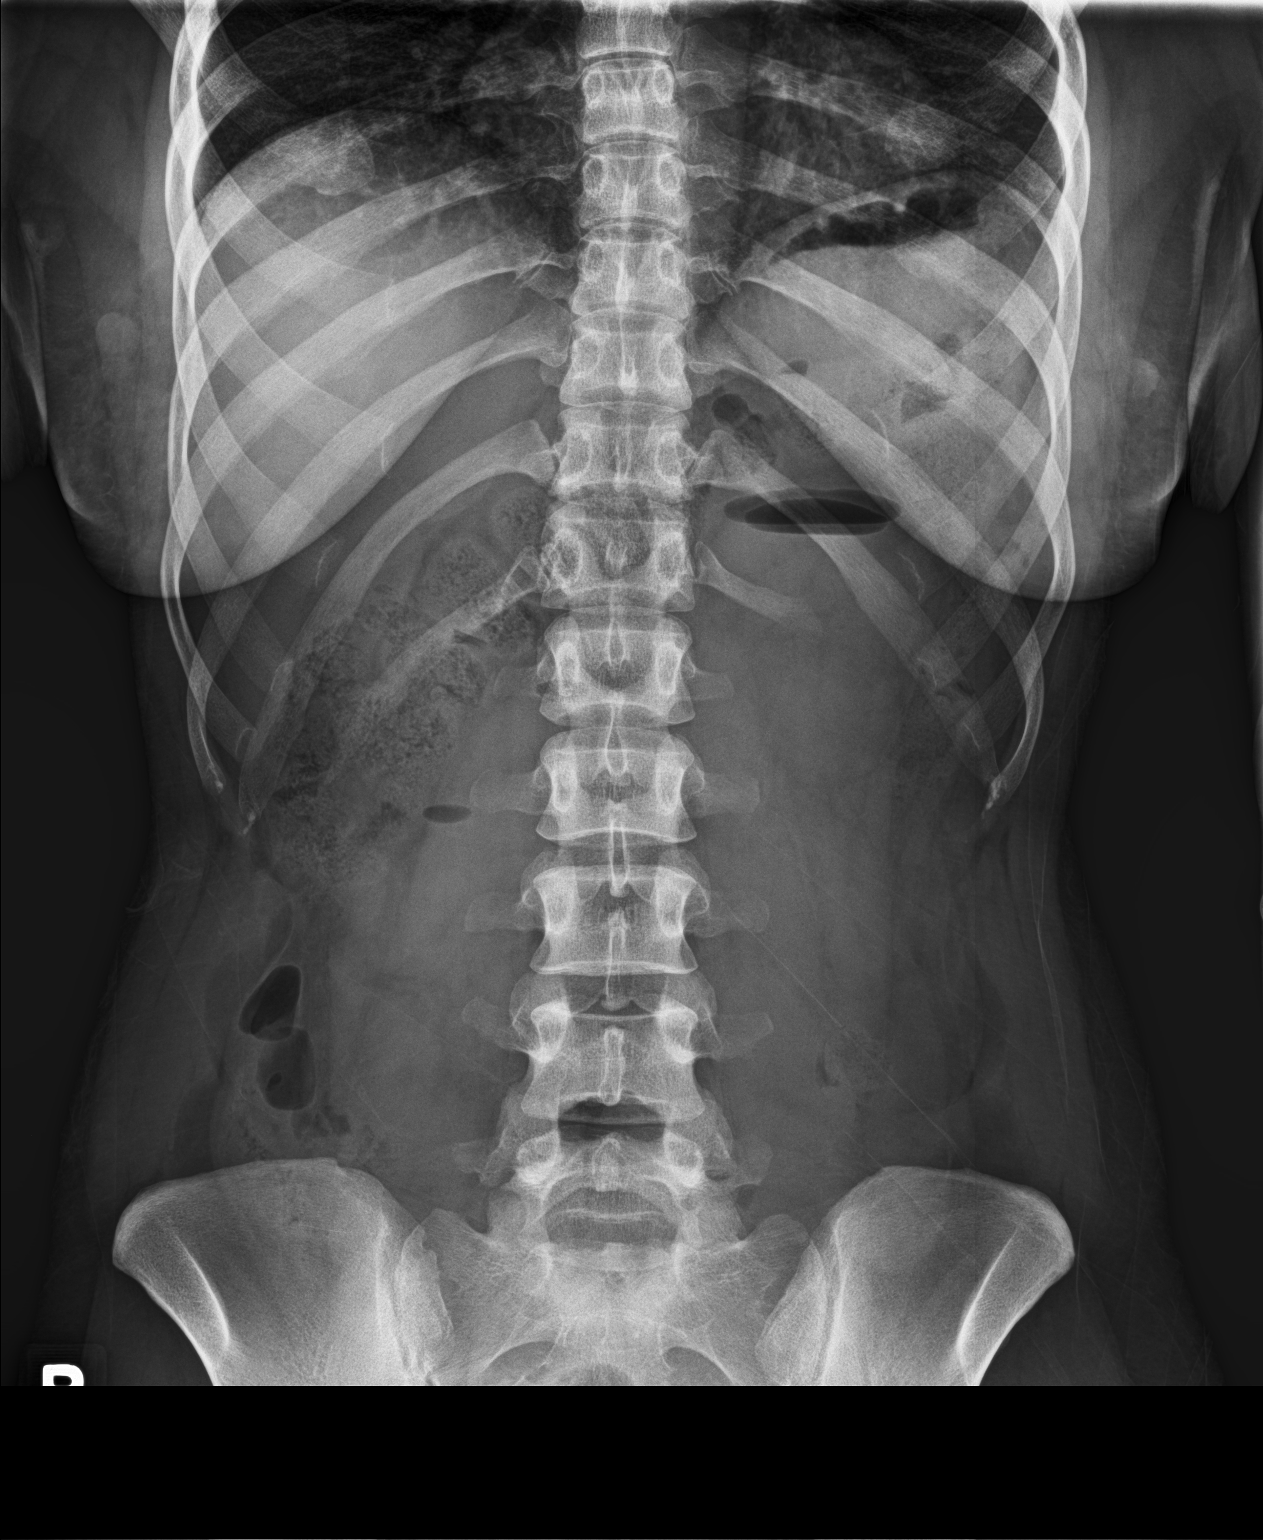

[abdomen erect (2 of 2)]
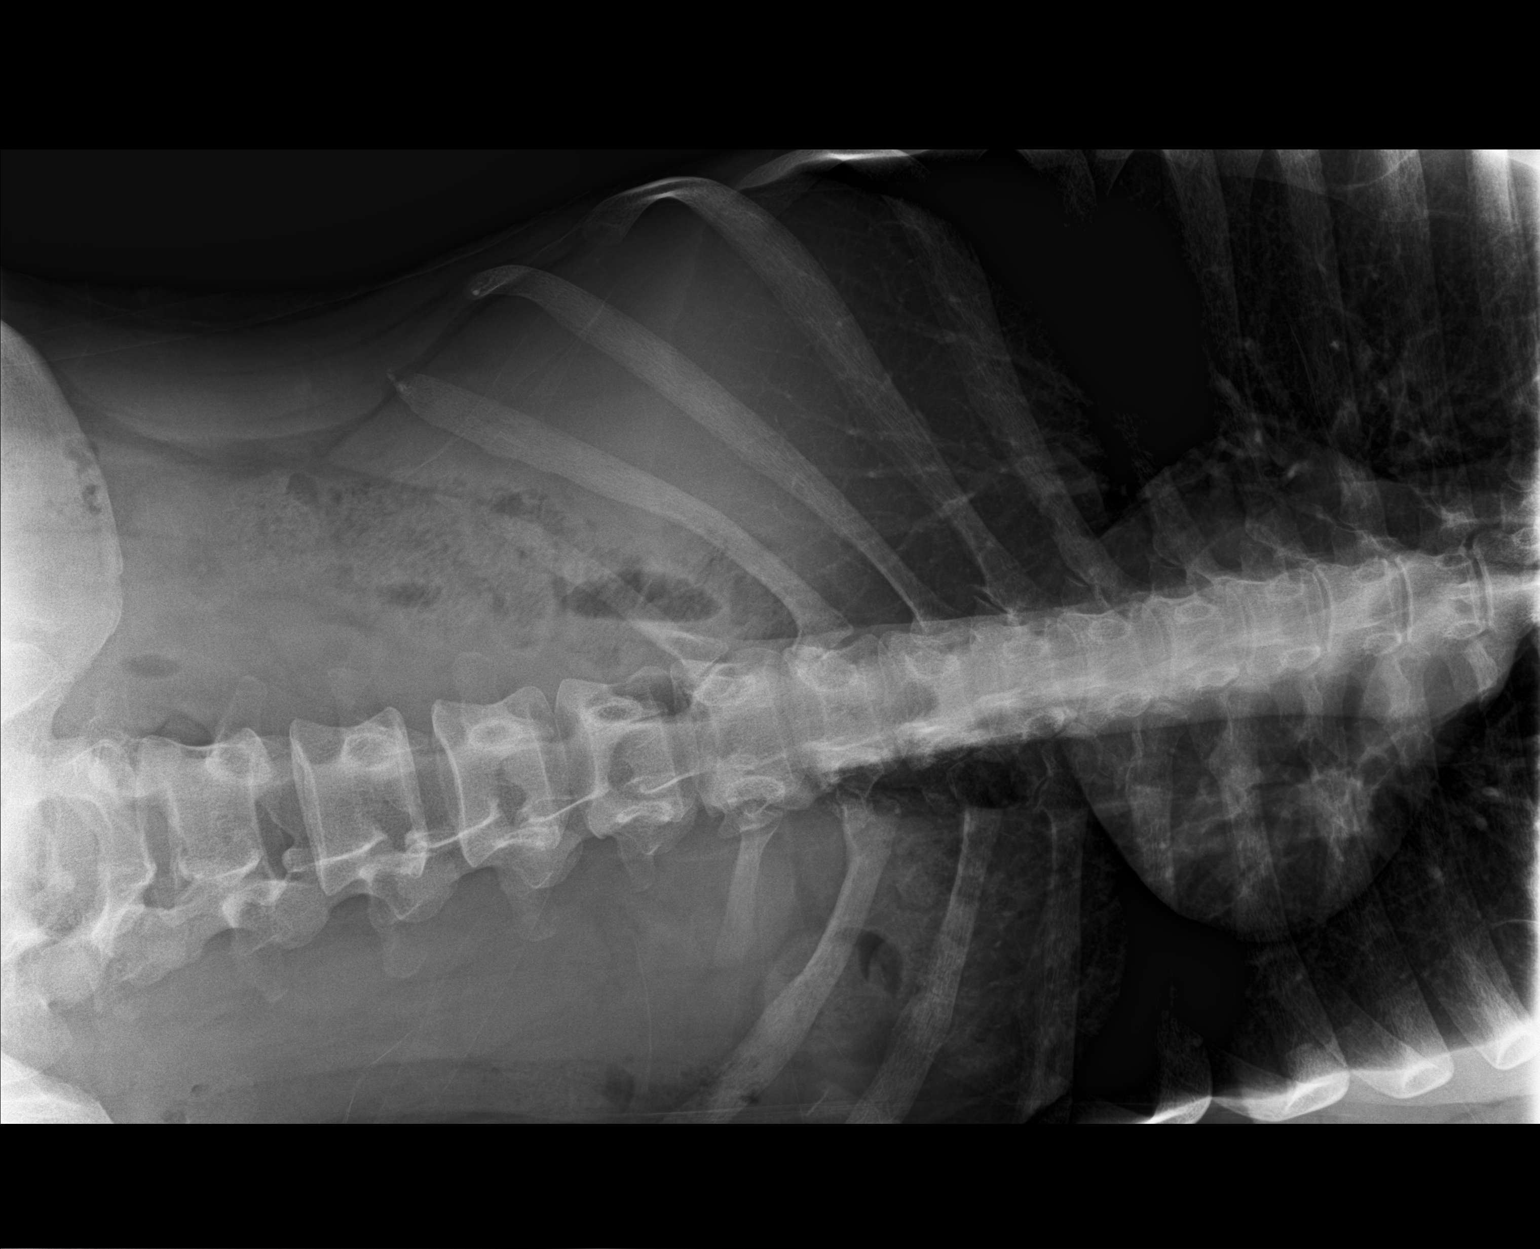

[2 of 2 positions shown; findings below may reference images not displayed]

FINDINGS: Relative paucity of small bowel gas. Stool visualized within the
ascending and transverse colon. No free intraperitoneal air. No
pneumatosis or portal venous gas. Lung bases are clear. Visualized
osseous skeleton is unremarkable.
IMPRESSION: Nonobstructed bowel gas pattern.

No free intraperitoneal air.

## 2016-09-11 ENCOUNTER — Ambulatory Visit (INDEPENDENT_AMBULATORY_CARE_PROVIDER_SITE_OTHER): Payer: 59 | Admitting: Internal Medicine

## 2016-09-11 ENCOUNTER — Encounter: Payer: Self-pay | Admitting: Internal Medicine

## 2016-09-11 VITALS — BP 110/58 | HR 68 | Temp 98.4°F | Resp 12 | Ht 63.0 in | Wt 144.0 lb

## 2016-09-11 DIAGNOSIS — M545 Low back pain, unspecified: Secondary | ICD-10-CM

## 2016-09-11 DIAGNOSIS — M5440 Lumbago with sciatica, unspecified side: Secondary | ICD-10-CM | POA: Insufficient documentation

## 2016-09-11 MED ORDER — TRAMADOL HCL 50 MG PO TABS: 50.0000 mg | ORAL_TABLET | Freq: Three times a day (TID) | ORAL | 0 refills | Status: DC | PRN

## 2016-09-11 NOTE — Progress Notes (Signed)
Pre visit review using our clinic review tool, if applicable. No additional management support is needed unless otherwise documented below in the visit note. 
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
## 2016-09-11 NOTE — Patient Instructions (Addendum)
We have put in the referral for physical therapy and you should be starting that soon.  We have also given you a prescription for the tramadol for pain. You can use this up to 3 times per day for pain.   If you are not improving call us back and we can consider the prednisone course.   You can also try a heating pad on the back.

## 2016-09-11 NOTE — Assessment & Plan Note (Signed)
Referral for physical therapy to help with the pain and rx for tramadol. Offered short course prednisone which she declines today. Talked to her about heat as well as the 4-6 week recovery time for muscles.

## 2016-10-10 ENCOUNTER — Ambulatory Visit: Payer: 59 | Admitting: Physical Therapy

## 2016-10-13 ENCOUNTER — Ambulatory Visit: Payer: 59 | Admitting: Physical Therapy

## 2016-10-16 ENCOUNTER — Ambulatory Visit: Payer: 59 | Attending: Internal Medicine | Admitting: Physical Therapy

## 2016-10-16 DIAGNOSIS — M545 Low back pain, unspecified: Secondary | ICD-10-CM

## 2016-10-16 DIAGNOSIS — M6281 Muscle weakness (generalized): Secondary | ICD-10-CM | POA: Diagnosis present

## 2016-10-16 DIAGNOSIS — G8929 Other chronic pain: Secondary | ICD-10-CM | POA: Diagnosis present

## 2016-10-16 DIAGNOSIS — M62830 Muscle spasm of back: Secondary | ICD-10-CM
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
## 2016-10-19 ENCOUNTER — Ambulatory Visit: Payer: 59 | Admitting: Physical Therapy

## 2016-10-19 DIAGNOSIS — G8929 Other chronic pain: Secondary | ICD-10-CM

## 2016-10-19 DIAGNOSIS — M545 Low back pain, unspecified: Secondary | ICD-10-CM

## 2016-10-19 DIAGNOSIS — M62830 Muscle spasm of back: Secondary | ICD-10-CM

## 2016-10-19 DIAGNOSIS — M6281 Muscle weakness (generalized): Secondary | ICD-10-CM

## 2016-10-19 NOTE — Therapy (Signed)
[place], [place], [address] Phone: [phone]   Fax:  [phone]  Physical Therapy Treatment  Patient Details  Name: Ebony Allen MRN: [id number] Date of Birth: May 01, 1971 Referring Provider: Pricilla Holm, MD  Encounter Date: 10/19/2016      PT End of Session - 10/19/16 1315    Visit Number 2   Number of Visits 13   Date for PT Re-Evaluation 11/27/16   PT Start Time 0100   PT Stop Time 0145   PT Time Calculation (min) 45 min      Past Medical History:  Diagnosis Date  . Anemia   . GERD (gastroesophageal reflux disease)     Past Surgical History:  Procedure Laterality Date  . DILATATION & CURETTAGE/HYSTEROSCOPY WITH MYOSURE N/A 11/17/2014   Procedure: DILATATION & CURETTAGE/HYSTEROSCOPY WITH MYOSURE;  Surgeon: Delsa Bern, MD;  Location: [facility];  Service: Gynecology;  Laterality: N/A;  . DILITATION & CURRETTAGE/HYSTROSCOPY WITH VERSAPOINT RESECTION N/A 05/20/2015   Procedure: DILATATION & CURETTAGE/HYSTEROSCOPY WITH VERSAPOINT RESECTION;  Surgeon: Delsa Bern, MD;  Location: [facility];  Service: Gynecology;  Laterality: N/A;  . esophageal dilation     Dr Ronalee Red  . ROBOT ASSISTED MYOMECTOMY N/A 11/17/2014   Procedure: ROBOTIC ASSISTED MYOMECTOMY;  Surgeon: Delsa Bern, MD;  Location: [facility];  Service: Gynecology;  Laterality: N/A;    There were no vitals filed for this visit.      Subjective Assessment - 10/19/16 1300    Subjective I am about the same    Currently in Pain? Yes   Pain Score 6    Pain Location Back   Pain Orientation Left;Right;Mid   Pain Descriptors / Indicators Aching;Tightness                         [facility] Adult PT Treatment/Exercise - 10/19/16 0001      Lumbar Exercises: Stretches   Active Hamstring Stretch 3 reps;30 seconds   Active Hamstring Stretch Limitations with sheet    Lower Trunk Rotation 10 seconds  6 reps    Pelvic Tilt 10  seconds   Pelvic Tilt Limitations min cues    Quadruped Mid Back Stretch Limitations childs pose 30 sec x 2, big stretch     Lumbar Exercises: Sidelying   Clam 15 reps   Clam Limitations bilat     Knee/Hip Exercises: Supine   Straight Leg Raises 15 reps  2 sets , left      Modalities   Modalities Electrical Stimulation     Electrical Stimulation   Electrical Stimulation Location Lumbar paraspinals    Electrical Stimulation Action IFC    Electrical Stimulation Parameters 12 mA   Electrical Stimulation Goals Pain                  PT Short Term Goals - 10/16/16 1651      PT SHORT TERM GOAL #1   Title Pt will require minimal cuing and be able to verbalize HEP by 11/06/16   Time 3   Period Weeks   Status New     PT SHORT TERM GOAL #2   Title pt will report decrease in pain from a 6/10 to </= 3/10 during activity (11/06/16)   Time 3   Period Weeks   Status New     PT SHORT TERM GOAL #3   Title pt will improve ROM in all direction by >/= 8 degrees by (11/06/16)   Time  3   Period Weeks   Status New           PT Long Term Goals - 10/16/16 1655      PT LONG TERM GOAL #1   Title pt will be independent with HEP by discharge (11/27/16)   Time 6   Period Weeks   Status New     PT LONG TERM GOAL #2   Title Pt will be able to work full shift and perform all work related activites with 0/10 pain (11/27/16)   Time 6   Period Weeks   Status New     PT LONG TERM GOAL #3   Title pt will be able to return to working out 4 times a week with </= to 2/10 pain (11/27/16)   Time 6   Period Weeks   Status New     PT LONG TERM GOAL #4   Title pt will increase her FOTO score to </= 28% limited to demonstrate improvement in functionat at discharge (11/27/2016)   Time 6   Period Weeks   Status New     PT LONG TERM GOAL #5   Title pt will increase her LLE hip strength to >/= 4/5 with </= 2/10 pain to assist with ADLS and work related task (11/27/2016)   Time 6   Period Weeks    Status New               Plan - 10/19/16 1338    Clinical Impression Statement Pt reports pain more centralized to lower lumbar, she's feeling right low back pain as well as left. Still more pain on left. She demonstrates pain behaviors during all transitions and bed mobility on mat. Instructed pt in log roll and she retruned demonstration 4 times during treatment. She reports log roll is very helpful to decrease pain. Review of HEP. She has performed 1 x per day thus far and notes no significant decrease in pain. Discussed the use of pelvic tilts for prolonged sitting and standing activites to decrease stiffness. She is able to perform independently in all positions. Trial of IFC today to decrease lumbar pain.   PT Next Visit Plan review HEP, assess benefit of IFC, anterior hip inominate mobs, HS stretching, hip strengthening   PT Home Exercise Plan HS stretching, SLRs, pelvic tilts, lower trunk rotation, child's pose   Consulted and Agree with Plan of Care Patient      Patient will benefit from skilled therapeutic intervention in order to improve the following deficits and impairments:  Difficulty walking, Decreased range of motion, Decreased activity tolerance, Pain, Hypomobility, Decreased strength, Decreased mobility, Postural dysfunction  Visit Diagnosis: Chronic left-sided low back pain without sciatica  Muscle weakness (generalized)  Muscle spasm of back     Problem List Patient Active Problem List   Diagnosis Date Noted  . Acute bilateral low back pain without sciatica 09/11/2016  . Routine general medical examination at a health care facility 01/11/2016  . GERD (gastroesophageal reflux disease) 03/09/2014    Dorene Ar , PTA 10/19/2016, 1:44 PM  [facility] [address] [place], [place], [address] Phone: [phone]   Fax:  [phone]  Name: Ebony Allen MRN: [id number] Date of Birth:  Feb 22, 1971

## 2016-10-24 ENCOUNTER — Encounter: Payer: 59 | Admitting: Physical Therapy

## 2016-10-25 ENCOUNTER — Ambulatory Visit: Payer: 59 | Admitting: Physical Therapy

## 2016-10-25 DIAGNOSIS — M62830 Muscle spasm of back: Secondary | ICD-10-CM

## 2016-10-25 DIAGNOSIS — G8929 Other chronic pain: Secondary | ICD-10-CM

## 2016-10-25 DIAGNOSIS — M545 Low back pain: Secondary | ICD-10-CM | POA: Diagnosis not present

## 2016-10-25 DIAGNOSIS — M6281 Muscle weakness (generalized): Secondary | ICD-10-CM

## 2016-10-25 NOTE — Therapy (Signed)
[facility], [place], [address] Phone: [phone]   Fax:  [phone]  Physical Therapy Treatment  Patient Details  Name: Ebony Allen MRN: [id number] Date of Birth: Jul 15, 1971 Referring Provider: Pricilla Holm, MD  Encounter Date: 10/25/2016      PT End of Session - 10/25/16 1600    Visit Number 3   Number of Visits 13   Date for PT Re-Evaluation 11/27/16   PT Start Time [id number]   PT Stop Time 1517   PT Time Calculation (min) 60 min   Activity Tolerance Patient tolerated treatment well   Behavior During Therapy [facility] for tasks assessed/performed      Past Medical History:  Diagnosis Date  . Anemia   . GERD (gastroesophageal reflux disease)     Past Surgical History:  Procedure Laterality Date  . DILATATION & CURETTAGE/HYSTEROSCOPY WITH MYOSURE N/A 11/17/2014   Procedure: DILATATION & CURETTAGE/HYSTEROSCOPY WITH MYOSURE;  Surgeon: Delsa Bern, MD;  Location: [facility];  Service: Gynecology;  Laterality: N/A;  . DILITATION & CURRETTAGE/HYSTROSCOPY WITH VERSAPOINT RESECTION N/A 05/20/2015   Procedure: DILATATION & CURETTAGE/HYSTEROSCOPY WITH VERSAPOINT RESECTION;  Surgeon: Delsa Bern, MD;  Location: [facility];  Service: Gynecology;  Laterality: N/A;  . esophageal dilation     Dr Ronalee Red  . ROBOT ASSISTED MYOMECTOMY N/A 11/17/2014   Procedure: ROBOTIC ASSISTED MYOMECTOMY;  Surgeon: Delsa Bern, MD;  Location: [facility];  Service: Gynecology;  Laterality: N/A;    There were no vitals filed for this visit.      Subjective Assessment - 10/25/16 1428    Subjective 7/10 NOW   Currently in Pain? Yes   Pain Score 7    Pain Location Back   Pain Orientation Left;Right;Mid   Pain Descriptors / Indicators Tightness   Pain Type Chronic pain   Pain Radiating Towards glutes left   Pain Frequency Constant   Aggravating Factors  TRAVEL bumpy air flight,  luggage   Pain Relieving Factors medication, heat    Multiple Pain Sites No                         [facility] Adult PT Treatment/Exercise - 10/25/16 0001      Lumbar Exercises: Supine   Clam --  clam with band painful so stopped   Clam Limitations ball squeeze 20 x   Bridge 10 reps  right leg has increased weight bearing   Straight Leg Raise 10 reps  2 sets 10     Knee/Hip Exercises: Supine   Bridges Limitations 10   Straight Leg Raises 10 reps;4 sets  right then left as pelvis position changed   Other Supine Knee/Hip Exercises ball squeeze 10 x 2 sets,  did not tolerate clams with green bands.     Moist Heat Therapy   Number Minutes Moist Heat 15 Minutes   Moist Heat Location Lumbar Spine     Electrical Stimulation   Electrical Stimulation Location Lumbar paraspinals    Electrical Stimulation Action IFC   Electrical Stimulation Parameters 12   Electrical Stimulation Goals Pain  concurrent with moist heat     Manual Therapy   Manual therapy comments contract relax left quads  2 sets 10 seconds 4 x                PT Education - 10/25/16 1810    Education provided Yes   Education Details HEP   Person(s) Educated Patient   Methods Explanation;Demonstration;Tactile cues;Verbal  cues;Handout   Comprehension Verbalized understanding;Returned demonstration          PT Short Term Goals - 10/25/16 1810      PT SHORT TERM GOAL #1   Title Pt will require minimal cuing and be able to verbalize HEP by 11/06/16   Baseline cues required   Time 3   Period Weeks   Status On-going     PT SHORT TERM GOAL #2   Title pt will report decrease in pain from a 6/10 to </= 3/10 during activity (11/06/16)   Baseline 8/10 with activity   Time 3   Period Weeks   Status On-going     PT SHORT TERM GOAL #3   Title pt will improve ROM in all direction by >/= 8 degrees by (11/06/16)   Time 3   Period Weeks   Status Unable to assess           PT Long Term Goals - 10/16/16 1655      PT LONG TERM GOAL #1   Title  pt will be independent with HEP by discharge (11/27/16)   Time 6   Period Weeks   Status New     PT LONG TERM GOAL #2   Title Pt will be able to work full shift and perform all work related activites with 0/10 pain (11/27/16)   Time 6   Period Weeks   Status New     PT LONG TERM GOAL #3   Title pt will be able to return to working out 4 times a week with </= to 2/10 pain (11/27/16)   Time 6   Period Weeks   Status New     PT LONG TERM GOAL #4   Title pt will increase her FOTO score to </= 28% limited to demonstrate improvement in functionat at discharge (11/27/2016)   Time 6   Period Weeks   Status New     PT LONG TERM GOAL #5   Title pt will increase her LLE hip strength to >/= 4/5 with </= 2/10 pain to assist with ADLS and work related task (11/27/2016)   Time 6   Period Weeks   Status New               Plan - 10/25/16 1601    Clinical Impression Statement Able to decrease pain from 7-8/10 to 4/10 with exercise,  Patient presented with changing pelvis positions,  New home exercises were added for Quadratus lumborum stretching.   PT Next Visit Plan review stretches,  stabilization,  stretching   PT Home Exercise Plan HS stretching, SLRs, pelvic tilts, lower trunk rotation, child's pose  quadratus lumborum stretching   Consulted and Agree with Plan of Care Patient      Patient will benefit from skilled therapeutic intervention in order to improve the following deficits and impairments:  Difficulty walking, Decreased range of motion, Decreased activity tolerance, Pain, Hypomobility, Decreased strength, Decreased mobility, Postural dysfunction  Visit Diagnosis: Chronic left-sided low back pain without sciatica  Muscle weakness (generalized)  Muscle spasm of back     Problem List Patient Active Problem List   Diagnosis Date Noted  . Acute bilateral low back pain without sciatica 09/11/2016  . Routine general medical examination at a health care facility 01/11/2016  .  GERD (gastroesophageal reflux disease) 03/09/2014    Ebony Allen PTA 10/25/2016, 6:13 PM  [facility] [address] [place], [place], [address] Phone: [phone]   Fax:  [phone]  Name: Ebony Allen MRN: [id number] Date of Birth: 1971-09-03

## 2016-10-25 NOTE — Patient Instructions (Signed)
Issued from exercise drawer Quadratus lumborum stretches on side , at door and standing PRN 3-5 x each 10-15 second holds.

## 2016-10-27 ENCOUNTER — Ambulatory Visit: Payer: 59 | Attending: Internal Medicine | Admitting: Physical Therapy

## 2016-10-27 DIAGNOSIS — G8929 Other chronic pain: Secondary | ICD-10-CM | POA: Diagnosis present

## 2016-10-27 DIAGNOSIS — M545 Low back pain: Secondary | ICD-10-CM | POA: Insufficient documentation

## 2016-10-27 DIAGNOSIS — M62830 Muscle spasm of back: Secondary | ICD-10-CM | POA: Diagnosis present

## 2016-10-27 DIAGNOSIS — M6281 Muscle weakness (generalized): Secondary | ICD-10-CM | POA: Diagnosis present

## 2016-10-27 NOTE — Therapy (Signed)
[place], [place], [address] Phone: [phone]   Fax:  [phone]  Physical Therapy Treatment  Patient Details  Name: Ebony Allen MRN: [id number] Date of Birth: November 20, 1970 Referring Provider: Pricilla Holm, MD  Encounter Date: 10/27/2016      PT End of Session - 10/27/16 1104    Visit Number 4   Number of Visits 13   Date for PT Re-Evaluation 11/27/16   PT Start Time 8546   PT Stop Time 1208   PT Time Calculation (min) 66 min      Past Medical History:  Diagnosis Date  . Anemia   . GERD (gastroesophageal reflux disease)     Past Surgical History:  Procedure Laterality Date  . DILATATION & CURETTAGE/HYSTEROSCOPY WITH MYOSURE N/A 11/17/2014   Procedure: DILATATION & CURETTAGE/HYSTEROSCOPY WITH MYOSURE;  Surgeon: Delsa Bern, MD;  Location: [facility];  Service: Gynecology;  Laterality: N/A;  . DILITATION & CURRETTAGE/HYSTROSCOPY WITH VERSAPOINT RESECTION N/A 05/20/2015   Procedure: DILATATION & CURETTAGE/HYSTEROSCOPY WITH VERSAPOINT RESECTION;  Surgeon: Delsa Bern, MD;  Location: [facility];  Service: Gynecology;  Laterality: N/A;  . esophageal dilation     Dr Ronalee Red  . ROBOT ASSISTED MYOMECTOMY N/A 11/17/2014   Procedure: ROBOTIC ASSISTED MYOMECTOMY;  Surgeon: Delsa Bern, MD;  Location: [facility];  Service: Gynecology;  Laterality: N/A;    There were no vitals filed for this visit.      Subjective Assessment - 10/27/16 1103    Subjective  a little bit better, not as intense.    Currently in Pain? Yes   Pain Score 6    Pain Location Back   Pain Orientation Mid;Right   Pain Descriptors / Indicators Aching;Tightness            [facility] PT Assessment - 10/27/16 0001      AROM   Lumbar - Right Side Bend 20   Lumbar - Left Side Bend 20  pain at end range                      [facility] Adult PT Treatment/Exercise - 10/27/16 0001      Lumbar Exercises: Stretches   Active  Hamstring Stretch Limitations contract relax 2 bouts follwed by passive    Quadruped Mid Back Stretch Limitations childs pose 30 sec x 2, big stretch   Prone Mid Back Stretch Limitations lateral x 2 as well      Lumbar Exercises: Supine   Clam 20 reps   Clam Limitations green band, tolerated well today    Bent Knee Raise Limitations difficulty maintaining level pelvis , performed after MET, added to HEP    Bridge 10 reps   Bridge Limitations with DF     Lumbar Exercises: Sidelying   Hip Abduction 15 reps     Lumbar Exercises: Prone   Single Arm Raise 5 reps   Straight Leg Raise 10 reps;5 reps   Straight Leg Raises Limitations alternating every 5 reps with abdominal brace      Knee/Hip Exercises: Supine   Bridges with Ball Squeeze 10 reps   Bridges with Clamshell 10 reps  green band    Straight Leg Raises 20 reps   Straight Leg Raises Limitations each     Moist Heat Therapy   Number Minutes Moist Heat 15 Minutes   Moist Heat Location Lumbar Spine     Electrical Stimulation   Electrical Stimulation Location Lumbar paraspinals    Electrical Stimulation Action  IFC    Electrical Stimulation Parameters 43m    Electrical Stimulation Goals Pain  concurrent with moist heat     Manual Therapy   Manual therapy comments mobs and MET performed by KCarlus PavlovPT, to treat left posterior inominate rotation prone with resisted left hip flexion                 PT Education - 10/27/16 1158    Education provided Yes   Education Details HEP    Person(s) Educated Patient   Methods Explanation;Handout   Comprehension Verbalized understanding          PT Short Term Goals - 10/27/16 1215      PT SHORT TERM GOAL #1   Title Pt will require minimal cuing and be able to verbalize HEP by 11/06/16   Baseline cues required   Time 3   Period Weeks   Status On-going     PT SHORT TERM GOAL #2   Title pt will report decrease in pain from a 6/10 to </= 3/10 during activity  (11/06/16)   Baseline 6/10 lowest report    Time 3   Period Weeks   Status On-going     PT SHORT TERM GOAL #3   Title pt will improve ROM in all direction by >/= 8 degrees by (11/06/16)   Baseline improved 5-6 degress in side bend    Time 3   Period Weeks   Status On-going           PT Long Term Goals - 10/16/16 1655      PT LONG TERM GOAL #1   Title pt will be independent with HEP by discharge (11/27/16)   Time 6   Period Weeks   Status New     PT LONG TERM GOAL #2   Title Pt will be able to work full shift and perform all work related activites with 0/10 pain (11/27/16)   Time 6   Period Weeks   Status New     PT LONG TERM GOAL #3   Title pt will be able to return to working out 4 times a week with </= to 2/10 pain (11/27/16)   Time 6   Period Weeks   Status New     PT LONG TERM GOAL #4   Title pt will increase her FOTO score to </= 28% limited to demonstrate improvement in functionat at discharge (11/27/2016)   Time 6   Period Weeks   Status New     PT LONG TERM GOAL #5   Title pt will increase her LLE hip strength to >/= 4/5 with </= 2/10 pain to assist with ADLS and work related task (11/27/2016)   Time 6   Period Weeks   Status New               Plan - 10/27/16 1204    Clinical Impression Statement Pt reports less pain. 6/10. She has less pain with mat exercises and transitions are easier. Focused lumbo pelvic stability and hip strength. She demonstrates improved lumbar side bend ROM. MET by primary PT used due to posterior rotation on left. After MET, pain rated at 4/10 and pain centralized to mid lower back. 2/10 after estim. Progressing toward STGs.    PT Next Visit Plan review stretches,  stabilization,  stretching; recheck pelvis; MET as needed    PT Home Exercise Plan HS stretching, SLRs, pelvic tilts, lower trunk rotation, child's pose  quadratus lumborum stretching, supine  marching    Consulted and Agree with Plan of Care Patient      Patient will  benefit from skilled therapeutic intervention in order to improve the following deficits and impairments:  Difficulty walking, Decreased range of motion, Decreased activity tolerance, Pain, Hypomobility, Decreased strength, Decreased mobility, Postural dysfunction  Visit Diagnosis: Chronic left-sided low back pain without sciatica  Muscle weakness (generalized)  Muscle spasm of back     Problem List Patient Active Problem List   Diagnosis Date Noted  . Acute bilateral low back pain without sciatica 09/11/2016  . Routine general medical examination at a health care facility 01/11/2016  . GERD (gastroesophageal reflux disease) 03/09/2014    Dorene Ar, PTA 10/27/2016, 12:27 PM  [facility] [address] [place], [place], [address] Phone: [phone]   Fax:  [phone]  Name: Ebony Allen MRN: [id number] Date of Birth: 12/10/1970

## 2016-10-31 ENCOUNTER — Ambulatory Visit: Payer: 59 | Admitting: Physical Therapy

## 2016-10-31 DIAGNOSIS — M545 Low back pain: Secondary | ICD-10-CM | POA: Diagnosis not present

## 2016-10-31 DIAGNOSIS — M6281 Muscle weakness (generalized): Secondary | ICD-10-CM

## 2016-10-31 DIAGNOSIS — M62830 Muscle spasm of back: Secondary | ICD-10-CM

## 2016-10-31 DIAGNOSIS — G8929 Other chronic pain: Secondary | ICD-10-CM

## 2016-10-31 NOTE — Therapy (Signed)
[place], [place], [address] Phone: [phone]   Fax:  [phone]  Physical Therapy Treatment  Patient Details  Name: Ebony Allen MRN: [id number] Date of Birth: 12/05/1970 Referring Provider: Pricilla Holm, MD  Encounter Date: 10/31/2016      PT End of Session - 10/31/16 1616    Visit Number 5   Number of Visits 13   Date for PT Re-Evaluation 11/27/16   PT Start Time 2353   PT Stop Time 1512   PT Time Calculation (min) 49 min   Behavior During Therapy [facility] for tasks assessed/performed      Past Medical History:  Diagnosis Date  . Anemia   . GERD (gastroesophageal reflux disease)     Past Surgical History:  Procedure Laterality Date  . DILATATION & CURETTAGE/HYSTEROSCOPY WITH MYOSURE N/A 11/17/2014   Procedure: DILATATION & CURETTAGE/HYSTEROSCOPY WITH MYOSURE;  Surgeon: Delsa Bern, MD;  Location: [facility];  Service: Gynecology;  Laterality: N/A;  . DILITATION & CURRETTAGE/HYSTROSCOPY WITH VERSAPOINT RESECTION N/A 05/20/2015   Procedure: DILATATION & CURETTAGE/HYSTEROSCOPY WITH VERSAPOINT RESECTION;  Surgeon: Delsa Bern, MD;  Location: [facility];  Service: Gynecology;  Laterality: N/A;  . esophageal dilation     Dr Ronalee Red  . ROBOT ASSISTED MYOMECTOMY N/A 11/17/2014   Procedure: ROBOTIC ASSISTED MYOMECTOMY;  Surgeon: Delsa Bern, MD;  Location: [facility];  Service: Gynecology;  Laterality: N/A;    There were no vitals filed for this visit.      Subjective Assessment - 10/31/16 1428    Subjective Some of the exercises are hard to do ( stabilization)   Pain is less intense and not as frequent.  Less pain getting up in the morning,  i am sleeping better   Currently in Pain? Yes   Pain Score 5    Pain Location Back   Pain Orientation Left;Mid;Lower   Pain Descriptors / Indicators Sore  tinge,  sting, pinch   Aggravating Factors  getting up in the morning,  sitting longer                          [facility] Adult PT Treatment/Exercise - 10/31/16 0001      Lumbar Exercises: Stretches   Passive Hamstring Stretch Limitations not tolerated     Lumbar Exercises: Standing   Wall Slides 5 reps   Wall Slides Limitations cued for neutral spine   Row --  3-5 reps,  intermittant cues for posture, attempted,stopped    Theraband Level (Row) Level 3 (Green)     Lumbar Exercises: Supine   Clam Limitations Painful today , unable with green band   Bridge 10 reps  2 sets   Straight Leg Raise 5 reps   Straight Leg Raises Limitations right only, stopped due to pain   Other Supine Lumbar Exercises Ball squeeze wit abdominal bracing 10 x2 sets this decreases pain     Lumbar Exercises: Sidelying   Hip Abduction 15 reps   cued for initial hip position, both sides     Moist Heat Therapy   Number Minutes Moist Heat 15 Minutes   Moist Heat Location Lumbar Spine     Manual Therapy   Manual therapy comments contract relax left quads  2 sets 10 seconds 4 x  to treat  left posterior inominate                  PT Short Term Goals - 10/31/16 1620  PT SHORT TERM GOAL #1   Title Pt will require minimal cuing and be able to verbalize HEP by 11/06/16   Baseline cues required   Time 3   Period Weeks   Status On-going     PT SHORT TERM GOAL #2   Title pt will report decrease in pain from a 6/10 to </= 3/10 during activity (11/06/16)   Baseline varies, 3/10 after exercises today   Time 3   Period Weeks   Status On-going     PT SHORT TERM GOAL #3   Title pt will improve ROM in all direction by >/= 8 degrees by (11/06/16)   Time 3   Period Weeks   Status Unable to assess           PT Long Term Goals - 10/16/16 1655      PT LONG TERM GOAL #1   Title pt will be independent with HEP by discharge (11/27/16)   Time 6   Period Weeks   Status New     PT LONG TERM GOAL #2   Title Pt will be able to work full shift and perform all work related  activites with 0/10 pain (11/27/16)   Time 6   Period Weeks   Status New     PT LONG TERM GOAL #3   Title pt will be able to return to working out 4 times a week with </= to 2/10 pain (11/27/16)   Time 6   Period Weeks   Status New     PT LONG TERM GOAL #4   Title pt will increase her FOTO score to </= 28% limited to demonstrate improvement in functionat at discharge (11/27/2016)   Time 6   Period Weeks   Status New     PT LONG TERM GOAL #5   Title pt will increase her LLE hip strength to >/= 4/5 with </= 2/10 pain to assist with ADLS and work related task (11/27/2016)   Time 6   Period Weeks   Status New               Plan - 10/31/16 1617    Clinical Impression Statement Pain at End of session rated 3/10, centralized.  Muscle energy helpful with pain and with treating left posteriorly roated inominate.  No new exercises needed for home today.     PT Next Visit Plan review stretches,  stabilization,  stretching; recheck pelvis; MET as needed    PT Home Exercise Plan HS stretching, SLRs, pelvic tilts, lower trunk rotation, child's pose  quadratus lumborum stretching, supine marching    Consulted and Agree with Plan of Care Patient      Patient will benefit from skilled therapeutic intervention in order to improve the following deficits and impairments:  Difficulty walking, Decreased range of motion, Decreased activity tolerance, Pain, Hypomobility, Decreased strength, Decreased mobility, Postural dysfunction  Visit Diagnosis: Chronic left-sided low back pain without sciatica  Muscle weakness (generalized)  Muscle spasm of back     Problem List Patient Active Problem List   Diagnosis Date Noted  . Acute bilateral low back pain without sciatica 09/11/2016  . Routine general medical examination at a health care facility 01/11/2016  . GERD (gastroesophageal reflux disease) 03/09/2014    HARRIS,KAREN PTA 10/31/2016, 4:21 PM  [facility] [address] [place], [place], [address] Phone: [phone]   Fax:  [phone]  Name: Ebony Allen MRN: [id number] Date of Birth: [birth year]

## 2016-11-03 ENCOUNTER — Ambulatory Visit: Payer: 59 | Admitting: Physical Therapy

## 2016-11-03 DIAGNOSIS — M6281 Muscle weakness (generalized): Secondary | ICD-10-CM

## 2016-11-03 DIAGNOSIS — M545 Low back pain: Secondary | ICD-10-CM | POA: Diagnosis not present

## 2016-11-03 DIAGNOSIS — M62830 Muscle spasm of back: Secondary | ICD-10-CM

## 2016-11-03 DIAGNOSIS — G8929 Other chronic pain: Secondary | ICD-10-CM

## 2016-11-03 NOTE — Therapy (Signed)
[place], [place], [address] Phone: [phone]   Fax:  [phone]  Physical Therapy Treatment  Patient Details  Name: Ebony Allen MRN: [id number] Date of Birth: 01-11-1971 Referring Provider: Pricilla Holm, MD  Encounter Date: 11/03/2016      PT End of Session - 11/03/16 1106    Visit Number 6   Number of Visits 13   Date for PT Re-Evaluation 11/27/16   PT Start Time 3382   PT Stop Time 1115   PT Time Calculation (min) 60 min      Past Medical History:  Diagnosis Date  . Anemia   . GERD (gastroesophageal reflux disease)     Past Surgical History:  Procedure Laterality Date  . DILATATION & CURETTAGE/HYSTEROSCOPY WITH MYOSURE N/A 11/17/2014   Procedure: DILATATION & CURETTAGE/HYSTEROSCOPY WITH MYOSURE;  Surgeon: Delsa Bern, MD;  Location: [facility];  Service: Gynecology;  Laterality: N/A;  . DILITATION & CURRETTAGE/HYSTROSCOPY WITH VERSAPOINT RESECTION N/A 05/20/2015   Procedure: DILATATION & CURETTAGE/HYSTEROSCOPY WITH VERSAPOINT RESECTION;  Surgeon: Delsa Bern, MD;  Location: [facility];  Service: Gynecology;  Laterality: N/A;  . esophageal dilation     Dr Ronalee Red  . ROBOT ASSISTED MYOMECTOMY N/A 11/17/2014   Procedure: ROBOTIC ASSISTED MYOMECTOMY;  Surgeon: Delsa Bern, MD;  Location: [facility];  Service: Gynecology;  Laterality: N/A;    There were no vitals filed for this visit.      Subjective Assessment - 11/03/16 1018    Subjective Not as bad as it has been. I can tell a difference. Morning is still the worst.    Currently in Pain? Yes   Pain Score 6    Pain Location Back   Pain Orientation Left;Mid;Lower   Pain Descriptors / Indicators Tightness   Pain Radiating Towards left glutes                          [facility] Adult PT Treatment/Exercise - 11/03/16 0001      Lumbar Exercises: Supine   Clam Limitations isometric clam with belt x 20   Other Supine Lumbar  Exercises ball squeeze x 15      Moist Heat Therapy   Number Minutes Moist Heat 15 Minutes   Moist Heat Location Lumbar Spine     Manual Therapy   Manual Therapy Soft tissue mobilization   Manual therapy comments Prone resisted hip flexion, as well as self MET with dowel    Soft tissue mobilization lumbar paraspinals with trigger point release x 3 each side.                 PT Education - 11/03/16 1105    Education provided Yes   Education Details MET    Person(s) Educated Patient   Methods Explanation;Handout   Comprehension Verbalized understanding          PT Short Term Goals - 10/31/16 1620      PT SHORT TERM GOAL #1   Title Pt will require minimal cuing and be able to verbalize HEP by 11/06/16   Baseline cues required   Time 3   Period Weeks   Status On-going     PT SHORT TERM GOAL #2   Title pt will report decrease in pain from a 6/10 to </= 3/10 during activity (11/06/16)   Baseline varies, 3/10 after exercises today   Time 3   Period Weeks   Status On-going     PT SHORT  TERM GOAL #3   Title pt will improve ROM in all direction by >/= 8 degrees by (11/06/16)   Time 3   Period Weeks   Status Unable to assess           PT Long Term Goals - 10/16/16 1655      PT LONG TERM GOAL #1   Title pt will be independent with HEP by discharge (11/27/16)   Time 6   Period Weeks   Status New     PT LONG TERM GOAL #2   Title Pt will be able to work full shift and perform all work related activites with 0/10 pain (11/27/16)   Time 6   Period Weeks   Status New     PT LONG TERM GOAL #3   Title pt will be able to return to working out 4 times a week with </= to 2/10 pain (11/27/16)   Time 6   Period Weeks   Status New     PT LONG TERM GOAL #4   Title pt will increase her FOTO score to </= 28% limited to demonstrate improvement in functionat at discharge (11/27/2016)   Time 6   Period Weeks   Status New     PT LONG TERM GOAL #5   Title pt will increase her  LLE hip strength to >/= 4/5 with </= 2/10 pain to assist with ADLS and work related task (11/27/2016)   Time 6   Period Weeks   Status New               Plan - 11/03/16 1110    Clinical Impression Statement Pt continues to dislay posteriorly rotated inominate. Self MET instruction and given for HEP. Pt level prior to therex. Education provided on the need for pelvic alignment prior to stabilization. Soft tissue IASTM and trigger point release to bilateral lumbar paraspinals. Pt reports significant decrease in pain/tightness afterward. Pt may be willing to try TPDN next week, if not, continue manual of beneficial.   PT Next Visit Plan review stretches,  stabilization,  stretching; recheck pelvis; MET as needed PT MAY BE WILLING TO DRY TPDN, Vania Rea willing to come in at next visit.    PT Home Exercise Plan HS stretching, SLRs, pelvic tilts, lower trunk rotation, child's pose  quadratus lumborum stretching, supine marching ; self MET    Consulted and Agree with Plan of Care Patient      Patient will benefit from skilled therapeutic intervention in order to improve the following deficits and impairments:  Difficulty walking, Decreased range of motion, Decreased activity tolerance, Pain, Hypomobility, Decreased strength, Decreased mobility, Postural dysfunction  Visit Diagnosis: Chronic left-sided low back pain without sciatica  Muscle weakness (generalized)  Muscle spasm of back     Problem List Patient Active Problem List   Diagnosis Date Noted  . Acute bilateral low back pain without sciatica 09/11/2016  . Routine general medical examination at a health care facility 01/11/2016  . GERD (gastroesophageal reflux disease) 03/09/2014    Dorene Ar, PTA 11/03/2016, 11:19 AM  [place], [place], [address] Phone: [phone]   Fax:  [phone]  Name: Ebony Allen MRN: [id number] Date of Birth:  20-Jun-1971

## 2016-11-07 ENCOUNTER — Ambulatory Visit: Payer: 59 | Admitting: Physical Therapy

## 2016-11-07 DIAGNOSIS — M62830 Muscle spasm of back: Secondary | ICD-10-CM

## 2016-11-07 DIAGNOSIS — M545 Low back pain: Secondary | ICD-10-CM | POA: Diagnosis not present

## 2016-11-07 DIAGNOSIS — G8929 Other chronic pain: Secondary | ICD-10-CM

## 2016-11-07 DIAGNOSIS — M6281 Muscle weakness (generalized): Secondary | ICD-10-CM

## 2016-11-07 NOTE — Therapy (Signed)
[place], [place], [address] Phone: [phone]   Fax:  [phone]  Physical Therapy Treatment  Patient Details  Name: Ebony Allen MRN: [id number] Date of Birth: [birth year] Referring Provider: Pricilla Holm, MD  Encounter Date: 11/07/2016      PT End of Session - 11/07/16 1505    Visit Number 7   Number of Visits 13   Date for PT Re-Evaluation 11/27/16   PT Start Time 6734   PT Stop Time 1506   PT Time Calculation (min) 51 min   Activity Tolerance Patient tolerated treatment well   Behavior During Therapy [facility] for tasks assessed/performed      Past Medical History:  Diagnosis Date  . Anemia   . GERD (gastroesophageal reflux disease)     Past Surgical History:  Procedure Laterality Date  . DILATATION & CURETTAGE/HYSTEROSCOPY WITH MYOSURE N/A 11/17/2014   Procedure: DILATATION & CURETTAGE/HYSTEROSCOPY WITH MYOSURE;  Surgeon: Delsa Bern, MD;  Location: [facility];  Service: Gynecology;  Laterality: N/A;  . DILITATION & CURRETTAGE/HYSTROSCOPY WITH VERSAPOINT RESECTION N/A 05/20/2015   Procedure: DILATATION & CURETTAGE/HYSTEROSCOPY WITH VERSAPOINT RESECTION;  Surgeon: Delsa Bern, MD;  Location: [facility];  Service: Gynecology;  Laterality: N/A;  . esophageal dilation     Dr Ronalee Red  . ROBOT ASSISTED MYOMECTOMY N/A 11/17/2014   Procedure: ROBOTIC ASSISTED MYOMECTOMY;  Surgeon: Delsa Bern, MD;  Location: [facility];  Service: Gynecology;  Laterality: N/A;    There were no vitals filed for this visit.      Subjective Assessment - 11/07/16 1424    Subjective No pain this morning.  I slept through the night.  Middle of back not hurting today.  Pain is now intermittant.   Currently in Pain? Yes   Pain Score 5    Pain Location Back   Pain Orientation Left;Lower   Pain Type Chronic pain   Pain Frequency Intermittent   Aggravating Factors  high heel walking   Pain Relieving Factors exercises,  Heat,  medicaion    Effect of Pain on Daily Activities Not back to gym,  light things at home.[facility] Adult PT Treatment/Exercise - 11/07/16 0001      Lumbar Exercises: Stretches   Double Knee to Chest Stretch 3 reps;30 seconds  legs on ball   Lower Trunk Rotation Limitations 5 X legs on ball cued for smaller motions     Lumbar Exercises: Supine   Clam 10 reps  blue band   Clam Limitations ball squeeze 10 X  not painful today   Bridge 10 reps   Bridge Limitations on ball   Straight Leg Raise 10 reps   Straight Leg Raises Limitations both     Lumbar Exercises: Sidelying   Clam 10 reps  each,  cued for neutral back     Knee/Hip Exercises: Stretches   Active Hamstring Stretch Limitations 1 X 30 right , mild pain   Other Knee/Hip Stretches Quadratus lumborum  stretches.     Moist Heat Therapy   Number Minutes Moist Heat 10 Minutes   Moist Heat Location Lumbar Spine  supine     Manual Therapy   Manual Therapy Soft tissue mobilization   Manual therapy comments instrument assist soft tissue work mid and lower back in sidelying left over a pillow.  PT Short Term Goals - 11/07/16 1507      PT SHORT TERM GOAL #1   Title Pt will require minimal cuing and be able to verbalize HEP by 11/06/16   Baseline They are going well at home   Time 3   Period Weeks   Status Achieved     PT SHORT TERM GOAL #2   Title pt will report decrease in pain from a 6/10 to </= 3/10 during activity (11/06/16)   Baseline 5/10 with activity today   Time 3   Period Weeks   Status On-going     PT SHORT TERM GOAL #3   Title pt will improve ROM in all direction by >/= 8 degrees by (11/06/16)   Time 3   Period Weeks   Status Unable to assess           PT Long Term Goals - 10/16/16 1655      PT LONG TERM GOAL #1   Title pt will be independent with HEP by discharge (11/27/16)   Time 6   Period Weeks   Status New     PT LONG TERM  GOAL #2   Title Pt will be able to work full shift and perform all work related activites with 0/10 pain (11/27/16)   Time 6   Period Weeks   Status New     PT LONG TERM GOAL #3   Title pt will be able to return to working out 4 times a week with </= to 2/10 pain (11/27/16)   Time 6   Period Weeks   Status New     PT LONG TERM GOAL #4   Title pt will increase her FOTO score to </= 28% limited to demonstrate improvement in functionat at discharge (11/27/2016)   Time 6   Period Weeks   Status New     PT LONG TERM GOAL #5   Title pt will increase her LLE hip strength to >/= 4/5 with </= 2/10 pain to assist with ADLS and work related task (11/27/2016)   Time 6   Period Weeks   Status New               Plan - 11/07/16 1506    Clinical Impression Statement Pain improving with less pain in the morning.  pain is intermittant now.  Less tender to palpation in quadrATUS LUMBORUM.  hIPS ARE LEVEL TODAY.  pain 3-4/10 at end of session prior to heat.  STG#1 met.   PT Next Visit Plan stabilization, consider manual at end of session.   PT Home Exercise Plan HS stretching, SLRs, pelvic tilts, lower trunk rotation, child's pose  quadratus lumborum stretching, supine marching ; self MET    Consulted and Agree with Plan of Care Patient      Patient will benefit from skilled therapeutic intervention in order to improve the following deficits and impairments:  Difficulty walking, Decreased range of motion, Decreased activity tolerance, Pain, Hypomobility, Decreased strength, Decreased mobility, Postural dysfunction  Visit Diagnosis: Chronic left-sided low back pain without sciatica  Muscle weakness (generalized)  Muscle spasm of back     Problem List Patient Active Problem List   Diagnosis Date Noted  . Acute bilateral low back pain without sciatica 09/11/2016  . Routine general medical examination at a health care facility 01/11/2016  . GERD (gastroesophageal reflux disease) 03/09/2014     HARRIS,KAREN PTA 11/07/2016, 3:11 PM  [place],  [place], [address] Phone: [phone]   Fax:  [phone]  Name: Ebony Allen MRN: [id number] Date of Birth: [birth year]

## 2016-11-10 ENCOUNTER — Ambulatory Visit: Payer: 59 | Admitting: Physical Therapy

## 2016-11-10 DIAGNOSIS — M545 Low back pain, unspecified: Secondary | ICD-10-CM

## 2016-11-10 DIAGNOSIS — G8929 Other chronic pain: Secondary | ICD-10-CM

## 2016-11-10 DIAGNOSIS — M6281 Muscle weakness (generalized): Secondary | ICD-10-CM

## 2016-11-10 DIAGNOSIS — M62830 Muscle spasm of back: Secondary | ICD-10-CM

## 2016-11-10 NOTE — Therapy (Signed)
[place] Allen, [place], [address] Phone: [phone]   Fax:  [phone]  Physical Therapy Treatment  Patient Details  Name: Ebony Allen MRN: [id number] Date of Birth: 06/01/1971 Referring Provider: Pricilla Holm, MD  Encounter Date: 11/10/2016      PT End of Session - 11/10/16 1109    Visit Number 8   Number of Visits 13   Date for PT Re-Evaluation 11/27/16   PT Start Time 1107   PT Stop Time 1200   PT Time Calculation (min) 53 min      Past Medical History:  Diagnosis Date  . Anemia   . GERD (gastroesophageal reflux disease)     Past Surgical History:  Procedure Laterality Date  . DILATATION & CURETTAGE/HYSTEROSCOPY WITH MYOSURE N/A 11/17/2014   Procedure: DILATATION & CURETTAGE/HYSTEROSCOPY WITH MYOSURE;  Surgeon: Delsa Bern, MD;  Location: [facility];  Service: Gynecology;  Laterality: N/A;  . DILITATION & CURRETTAGE/HYSTROSCOPY WITH VERSAPOINT RESECTION N/A 05/20/2015   Procedure: DILATATION & CURETTAGE/HYSTEROSCOPY WITH VERSAPOINT RESECTION;  Surgeon: Delsa Bern, MD;  Location: [facility];  Service: Gynecology;  Laterality: N/A;  . esophageal dilation     Dr Ronalee Red  . ROBOT ASSISTED MYOMECTOMY N/A 11/17/2014   Procedure: ROBOTIC ASSISTED MYOMECTOMY;  Surgeon: Delsa Bern, MD;  Location: [facility];  Service: Gynecology;  Laterality: N/A;    There were no vitals filed for this visit.      Subjective Assessment - 11/10/16 1108    Subjective Still feel it but its much better   Currently in Pain? Yes   Pain Score 4    Pain Location Back   Pain Orientation Left;Mid;Lower   Pain Descriptors / Indicators Tightness;Discomfort  very tense    Aggravating Factors  sitting prolonged, walking prolonged, standing prolonged.   Pain Relieving Factors stretching             [facility] PT Assessment - 11/10/16 0001      Observation/Other Assessments   Focus on Therapeutic Outcomes (FOTO)  40% limited ,  47% limited on eval      Strength   Right Hip Extension 5/5   Right Hip ABduction 4+/5   Left Hip Extension 4+/5   Left Hip ABduction 4+/5                     [facility] Adult PT Treatment/Exercise - 11/10/16 0001      Lumbar Exercises: Stretches   Prone Mid Back Stretch Limitations lateral x 2 as well      Lumbar Exercises: Supine   Ab Set 5 reps   Heel Slides 20 reps   Heel Slides Limitations with abdominal brace    Bent Knee Raise Limitations scissor level 2      Lumbar Exercises: Quadruped   [place] Horse 10 reps   Single Arm Raise 5 reps   Straight Leg Raise 5 reps   Opposite Arm/Leg Raise 10 reps  cues for neutral spine and ab brace      Knee/Hip Exercises: Supine   Bridges with Ball Squeeze 10 reps   Bridges with Clamshell 10 reps;2 sets  blue band    Bridges with Clamshell 10 reps     Moist Heat Therapy   Number Minutes Moist Heat 15 Minutes   Moist Heat Location Lumbar Spine     Electrical Stimulation   Electrical Stimulation Location Lumbar paraspinals    Electrical Stimulation Action IFC x 15 min   Electrical Stimulation Parameters 53m  Electrical Stimulation Goals Pain  concurrent with moist heat     Manual Therapy   Manual therapy comments sidelying resisted hip flexion MET                 PT Education - 11/10/16 1156    Education provided Yes   Education Details Table top    [employer]) Educated Patient   Methods Explanation;Handout   Comprehension Verbalized understanding          PT Short Term Goals - 11/07/16 1507      PT SHORT TERM GOAL #1   Title Pt will require minimal cuing and be able to verbalize HEP by 11/06/16   Baseline They are going well at home   Time 3   Period Weeks   Status Achieved     PT SHORT TERM GOAL #2   Title pt will report decrease in pain from a 6/10 to </= 3/10 during activity (11/06/16)   Baseline 5/10 with activity today   Time 3   Period Weeks   Status On-going     PT SHORT TERM  GOAL #3   Title pt will improve ROM in all direction by >/= 8 degrees by (11/06/16)   Time 3   Period Weeks   Status Unable to assess           PT Long Term Goals - 11/10/16 1157      PT LONG TERM GOAL #1   Title pt will be independent with HEP by discharge (11/27/16)   Time 6   Period Weeks   Status On-going     PT LONG TERM GOAL #2   Title Pt will be able to work full shift and perform all work related activites with 0/10 pain (11/27/16)   Baseline 4/10   Time 6   Period Weeks   Status On-going     PT LONG TERM GOAL #3   Title pt will be able to return to working out 4 times a week with </= to 2/10 pain (11/27/16)   Baseline has not returned to gym    Time 6   Period Weeks   Status On-going     PT LONG TERM GOAL #4   Title pt will increase her FOTO score to </= 28% limited to demonstrate improvement in functionat at discharge (11/27/2016)   Baseline 40%    Time 6   Period Weeks   Status On-going     PT LONG TERM GOAL #5   Title pt will increase her LLE hip strength to >/= 4/5 with </= 2/10 pain to assist with ADLS and work related task (11/27/2016)   Baseline see objective measures    Time 6   Period Weeks   Status Partially Met               Plan - 11/10/16 1145    Clinical Impression Statement Pt reports soft tissue work helpful for the rest of the day and then pain returns. Continued feeling of tension in lower back that increases with prolonged positioning. MMT of hips improved. Advance lumbar stabilization to include table top position while maintining neutral spine. Pt reports decreased tension in lumbar when engaging abdominals. Added to HEP.    PT Next Visit Plan stabilization, consider manual at end of session.    PT Home Exercise Plan HS stretching, SLRs, pelvic tilts, lower trunk rotation, child's pose  quadratus lumborum stretching, supine marching- advanced to table top, alternating one leg up and down  at a time.  ; self MET    Consulted and Agree with  Plan of Care Patient      Patient will benefit from skilled therapeutic intervention in order to improve the following deficits and impairments:  Difficulty walking, Decreased range of motion, Decreased activity tolerance, Pain, Hypomobility, Decreased strength, Decreased mobility, Postural dysfunction  Visit Diagnosis: Chronic left-sided low back pain without sciatica  Muscle weakness (generalized)  Muscle spasm of back     Problem List Patient Active Problem List   Diagnosis Date Noted  . Acute bilateral low back pain without sciatica 09/11/2016  . Routine general medical examination at a health care facility 01/11/2016  . GERD (gastroesophageal reflux disease) 03/09/2014    Dorene Ar, PTA 11/10/2016, 12:03 PM  [facility] [address] [place], [place], [address] Phone: [phone]   Fax:  [phone]  Name: Yarelin Reichardt MRN: [id number] Date of Birth: 1970/11/19

## 2016-11-14 ENCOUNTER — Ambulatory Visit: Payer: 59 | Admitting: Physical Therapy

## 2016-11-14 DIAGNOSIS — M62830 Muscle spasm of back: Secondary | ICD-10-CM

## 2016-11-14 DIAGNOSIS — M545 Low back pain, unspecified: Secondary | ICD-10-CM

## 2016-11-14 DIAGNOSIS — G8929 Other chronic pain: Secondary | ICD-10-CM

## 2016-11-14 DIAGNOSIS — M6281 Muscle weakness (generalized): Secondary | ICD-10-CM

## 2016-11-14 NOTE — Therapy (Signed)
[place] Allen, [place], [address] Phone: [phone]   Fax:  [phone]  Physical Therapy Treatment  Patient Details  Name: Ebony Allen MRN: [id number] Date of Birth: 1971/07/02 Referring Provider: Pricilla Holm, MD  Encounter Date: 11/14/2016      PT End of Session - 11/14/16 1453    Visit Number 9   Number of Visits 13   Date for PT Re-Evaluation 11/27/16   PT Start Time 3295   PT Stop Time 1500   PT Time Calculation (min) 53 min   Activity Tolerance Patient tolerated treatment well   Behavior During Therapy [facility] for tasks assessed/performed      Past Medical History:  Diagnosis Date  . Anemia   . GERD (gastroesophageal reflux disease)     Past Surgical History:  Procedure Laterality Date  . DILATATION & CURETTAGE/HYSTEROSCOPY WITH MYOSURE N/A 11/17/2014   Procedure: DILATATION & CURETTAGE/HYSTEROSCOPY WITH MYOSURE;  Surgeon: Delsa Bern, MD;  Location: [facility];  Service: Gynecology;  Laterality: N/A;  . DILITATION & CURRETTAGE/HYSTROSCOPY WITH VERSAPOINT RESECTION N/A 05/20/2015   Procedure: DILATATION & CURETTAGE/HYSTEROSCOPY WITH VERSAPOINT RESECTION;  Surgeon: Delsa Bern, MD;  Location: [facility];  Service: Gynecology;  Laterality: N/A;  . esophageal dilation     Dr Ronalee Red  . ROBOT ASSISTED MYOMECTOMY N/A 11/17/2014   Procedure: ROBOTIC ASSISTED MYOMECTOMY;  Surgeon: Delsa Bern, MD;  Location: [facility];  Service: Gynecology;  Laterality: N/A;    There were no vitals filed for this visit.      Subjective Assessment - 11/14/16 1407    Subjective "I am doing better, the pain has subsided for the most part. Sitting is the biggest irritant."   Currently in Pain? Yes   Pain Score 4    Pain Location Back   Pain Orientation Left;Mid;Lower   Pain Descriptors / Indicators Discomfort;Tightness;Sore   Aggravating Factors  prolonged sitting, walking, standing   Pain Relieving Factors stretching,                           [facility] Adult PT Treatment/Exercise - 11/14/16 0001      Lumbar Exercises: Supine   Bridge 15 reps   Other Supine Lumbar Exercises table top 2 x 10 with 20 s hold     Knee/Hip Exercises: Aerobic   Nustep L4 x 6 min  UE/LE     Moist Heat Therapy   Number Minutes Moist Heat 10 Minutes   Moist Heat Location Lumbar Spine     Manual Therapy   Manual Therapy Joint mobilization   Joint Mobilization Grade 2-3 central PA PAIVM L1-L5   Soft tissue mobilization IASTM over L lumbar paraspinals          Trigger Point Dry Needling - 11/14/16 1412    Consent Given? Yes   Education Handout Provided Yes   Muscles Treated Upper Body Longissimus   Longissimus Response Twitch response elicited;Palpable increased muscle length  L lumbar paraspinals L3-L5              PT Education - 11/14/16 1452    Education provided Yes   Education Details education on benefits and what to expect with DN   Person(s) Educated Patient   Methods Explanation   Comprehension Verbalized understanding          PT Short Term Goals - 11/07/16 1507      PT SHORT TERM GOAL #1   Title Pt will require  minimal cuing and be able to verbalize HEP by 11/06/16   Baseline They are going well at home   Time 3   Period Weeks   Status Achieved     PT SHORT TERM GOAL #2   Title pt will report decrease in pain from a 6/10 to </= 3/10 during activity (11/06/16)   Baseline 5/10 with activity today   Time 3   Period Weeks   Status On-going     PT SHORT TERM GOAL #3   Title pt will improve ROM in all direction by >/= 8 degrees by (11/06/16)   Time 3   Period Weeks   Status Unable to assess           PT Long Term Goals - 11/10/16 1157      PT LONG TERM GOAL #1   Title pt will be independent with HEP by discharge (11/27/16)   Time 6   Period Weeks   Status On-going     PT LONG TERM GOAL #2   Title Pt will be able to work full shift and perform all work related  activites with 0/10 pain (11/27/16)   Baseline 4/10   Time 6   Period Weeks   Status On-going     PT LONG TERM GOAL #3   Title pt will be able to return to working out 4 times a week with </= to 2/10 pain (11/27/16)   Baseline has not returned to gym    Time 6   Period Weeks   Status On-going     PT LONG TERM GOAL #4   Title pt will increase her FOTO score to </= 28% limited to demonstrate improvement in functionat at discharge (11/27/2016)   Baseline 40%    Time 6   Period Weeks   Status On-going     PT LONG TERM GOAL #5   Title pt will increase her LLE hip strength to >/= 4/5 with </= 2/10 pain to assist with ADLS and work related task (11/27/2016)   Baseline see objective measures    Time 6   Period Weeks   Status Partially Met               Plan - 11/14/16 1454    Clinical Impression Statement pt reported that her pain has decreased fo the most part, and the biggest aggravating factor is prolonged sitting. DN was performed by a licensed PT. Afterwards she reported a decrease in her back pain and tightness. Focused on core manual and core strengthening to help calm down soreness. Continued with MHP post session.    PT Next Visit Plan stabilization, consider manual at end of session, core strengthening   PT Home Exercise Plan HS stretching, SLRs, pelvic tilts, lower trunk rotation, child's pose  quadratus lumborum stretching, supine marching- advanced to table top, alternating one leg up and down at a time.  ; self MET    Consulted and Agree with Plan of Care Patient      Patient will benefit from skilled therapeutic intervention in order to improve the following deficits and impairments:  Difficulty walking, Decreased range of motion, Decreased activity tolerance, Pain, Hypomobility, Decreased strength, Decreased mobility, Postural dysfunction  Visit Diagnosis: Chronic left-sided low back pain without sciatica  Muscle spasm of back  Muscle weakness  (generalized)     Problem List Patient Active Problem List   Diagnosis Date Noted  . Acute bilateral low back pain without sciatica 09/11/2016  . Routine general  medical examination at a health care facility 01/11/2016  . GERD (gastroesophageal reflux disease) 03/09/2014    Ebony Allen, SPT 11/14/2016, 3:00 PM  [place], [place], [address] Phone: [phone]   Fax:  [phone]  Name: Roniqua Kintz MRN: [id number] Date of Birth: 05/16/1971

## 2016-11-17 ENCOUNTER — Ambulatory Visit: Payer: 59 | Admitting: Physical Therapy

## 2016-11-17 DIAGNOSIS — M545 Low back pain, unspecified: Secondary | ICD-10-CM

## 2016-11-17 DIAGNOSIS — G8929 Other chronic pain: Secondary | ICD-10-CM

## 2016-11-17 DIAGNOSIS — M6281 Muscle weakness (generalized): Secondary | ICD-10-CM

## 2016-11-17 DIAGNOSIS — M62830 Muscle spasm of back: Secondary | ICD-10-CM

## 2016-11-17 NOTE — Therapy (Signed)
[place], [place], [address] Phone: [phone]   Fax:  [phone]  Physical Therapy Treatment  Patient Details  Name: Ebony Allen MRN: [id number] Date of Birth: 02-05-1971 Referring Provider: Pricilla Holm, MD  Encounter Date: 11/17/2016      PT End of Session - 11/17/16 1137    Visit Number 10   Number of Visits 13   Date for PT Re-Evaluation 11/27/16   PT Start Time 7782   PT Stop Time 1144   PT Time Calculation (min) 42 min   Activity Tolerance Patient tolerated treatment well   Behavior During Therapy [facility] for tasks assessed/performed      Past Medical History:  Diagnosis Date  . Anemia   . GERD (gastroesophageal reflux disease)     Past Surgical History:  Procedure Laterality Date  . DILATATION & CURETTAGE/HYSTEROSCOPY WITH MYOSURE N/A 11/17/2014   Procedure: DILATATION & CURETTAGE/HYSTEROSCOPY WITH MYOSURE;  Surgeon: Delsa Bern, MD;  Location: [facility];  Service: Gynecology;  Laterality: N/A;  . DILITATION & CURRETTAGE/HYSTROSCOPY WITH VERSAPOINT RESECTION N/A 05/20/2015   Procedure: DILATATION & CURETTAGE/HYSTEROSCOPY WITH VERSAPOINT RESECTION;  Surgeon: Delsa Bern, MD;  Location: [facility];  Service: Gynecology;  Laterality: N/A;  . esophageal dilation     Dr Ronalee Red  . ROBOT ASSISTED MYOMECTOMY N/A 11/17/2014   Procedure: ROBOTIC ASSISTED MYOMECTOMY;  Surgeon: Delsa Bern, MD;  Location: [facility];  Service: Gynecology;  Laterality: N/A;    There were no vitals filed for this visit.      Subjective Assessment - 11/17/16 1106    Subjective " I am doing much better, pretty much feeling no pain. haven't returned to the gym but have been doing stuff around the house"   Currently in Pain? No/denies                         [facility] Adult PT Treatment/Exercise - 11/17/16 1108      Lumbar Exercises: Stretches   Active Hamstring Stretch 3 reps;30 seconds   Prone Mid Back  Stretch 2 reps;30 seconds  childs pose, 2 x 30 sec walking hands R     Lumbar Exercises: Standing   Other Standing Lumbar Exercises lateral band walks 2 x 10   with red theraband.     Lumbar Exercises: Supine   Bridge 5 reps  x 2 sets with alternating knee exten   Bridge Limitations verbal cues to keep core tight and pelvis level   Other Supine Lumbar Exercises Dead bug 4 x 15 sec   verbal cues to keep core tight     Knee/Hip Exercises: Aerobic   Elliptical L2, 0 ramp x 5 min                PT Education - 11/17/16 1137    Education provided Yes   Education Details updated HEP for hip/ core strengthening   Person(s) Educated Patient   Methods Explanation;Verbal cues;Handout   Comprehension Verbalized understanding;Verbal cues required          PT Short Term Goals - 11/07/16 1507      PT SHORT TERM GOAL #1   Title Pt will require minimal cuing and be able to verbalize HEP by 11/06/16   Baseline They are going well at home   Time 3   Period Weeks   Status Achieved     PT SHORT TERM GOAL #2   Title pt will report decrease in pain from  a 6/10 to </= 3/10 during activity (11/06/16)   Baseline 5/10 with activity today   Time 3   Period Weeks   Status On-going     PT SHORT TERM GOAL #3   Title pt will improve ROM in all direction by >/= 8 degrees by (11/06/16)   Time 3   Period Weeks   Status Unable to assess           PT Long Term Goals - 11/10/16 1157      PT LONG TERM GOAL #1   Title pt will be independent with HEP by discharge (11/27/16)   Time 6   Period Weeks   Status On-going     PT LONG TERM GOAL #2   Title Pt will be able to work full shift and perform all work related activites with 0/10 pain (11/27/16)   Baseline 4/10   Time 6   Period Weeks   Status On-going     PT LONG TERM GOAL #3   Title pt will be able to return to working out 4 times a week with </= to 2/10 pain (11/27/16)   Baseline has not returned to gym    Time 6   Period Weeks    Status On-going     PT LONG TERM GOAL #4   Title pt will increase her FOTO score to </= 28% limited to demonstrate improvement in functionat at discharge (11/27/2016)   Baseline 40%    Time 6   Period Weeks   Status On-going     PT LONG TERM GOAL #5   Title pt will increase her LLE hip strength to >/= 4/5 with </= 2/10 pain to assist with ADLS and work related task (11/27/2016)   Baseline see objective measures    Time 6   Period Weeks   Status Partially Met               Plan - 11/17/16 1155    Clinical Impression Statement [name] reports she has no pain today. Focused on strengthening of the LE mimicking gym exercises, also core strengthening which she perofrmed well with minimal cueing to keep the core tight. She reported no pain post session and declined modalities post session.    PT Next Visit Plan hip strengthening, core, manual PRN, Ellipitical, stretching, gym specific exercises.    PT Home Exercise Plan HS stretching, SLRs, pelvic tilts, lower trunk rotation, child's pose  quadratus lumborum stretching, supine marching- advanced to table top, alternating one leg up and down at a time.  ; self MET    Consulted and Agree with Plan of Care Patient      Patient will benefit from skilled therapeutic intervention in order to improve the following deficits and impairments:  Difficulty walking, Decreased range of motion, Decreased activity tolerance, Pain, Hypomobility, Decreased strength, Decreased mobility, Postural dysfunction  Visit Diagnosis: Chronic left-sided low back pain without sciatica  Muscle weakness (generalized)  Muscle spasm of back     Problem List Patient Active Problem List   Diagnosis Date Noted  . Acute bilateral low back pain without sciatica 09/11/2016  . Routine general medical examination at a health care facility 01/11/2016  . GERD (gastroesophageal reflux disease) 03/09/2014   Starr Lake PT, DPT, LAT, ATC  11/17/16  12:05  PM      [place] [facility] [address] [place], [place], [address] Phone: [phone]   Fax:  [phone]  Name: Ebony Allen MRN: [id number]  Date of Birth: [birth year]

## 2016-11-28 ENCOUNTER — Encounter: Payer: Self-pay | Admitting: Physical Therapy

## 2016-11-28 ENCOUNTER — Ambulatory Visit: Payer: 59 | Attending: Internal Medicine | Admitting: Physical Therapy

## 2016-11-28 DIAGNOSIS — M6281 Muscle weakness (generalized): Secondary | ICD-10-CM

## 2016-11-28 DIAGNOSIS — M62830 Muscle spasm of back: Secondary | ICD-10-CM | POA: Diagnosis present

## 2016-11-28 DIAGNOSIS — M545 Low back pain, unspecified: Secondary | ICD-10-CM

## 2016-11-28 DIAGNOSIS — G8929 Other chronic pain: Secondary | ICD-10-CM | POA: Diagnosis present

## 2016-11-28 NOTE — Therapy (Signed)
[place], [place], [address] Phone: [phone]   Fax:  [phone]  Physical Therapy Treatment/Discharge Summary  Patient Details  Name: Ebony Allen MRN: [id number] Date of Birth: 20-Aug-1971 Referring Provider: Pricilla Holm, MD  Encounter Date: 11/28/2016      PT End of Session - 11/28/16 1331    Visit Number 11   Number of Visits 13   PT Start Time 1331   PT Stop Time 1411   PT Time Calculation (min) 40 min   Activity Tolerance Patient tolerated treatment well   Behavior During Therapy [facility] for tasks assessed/performed      Past Medical History:  Diagnosis Date  . Anemia   . GERD (gastroesophageal reflux disease)     Past Surgical History:  Procedure Laterality Date  . DILATATION & CURETTAGE/HYSTEROSCOPY WITH MYOSURE N/A 11/17/2014   Procedure: DILATATION & CURETTAGE/HYSTEROSCOPY WITH MYOSURE;  Surgeon: Delsa Bern, MD;  Location: [facility];  Service: Gynecology;  Laterality: N/A;  . DILITATION & CURRETTAGE/HYSTROSCOPY WITH VERSAPOINT RESECTION N/A 05/20/2015   Procedure: DILATATION & CURETTAGE/HYSTEROSCOPY WITH VERSAPOINT RESECTION;  Surgeon: Delsa Bern, MD;  Location: [facility];  Service: Gynecology;  Laterality: N/A;  . esophageal dilation     Dr Ronalee Red  . ROBOT ASSISTED MYOMECTOMY N/A 11/17/2014   Procedure: ROBOTIC ASSISTED MYOMECTOMY;  Surgeon: Delsa Bern, MD;  Location: [facility];  Service: Gynecology;  Laterality: N/A;    There were no vitals filed for this visit.      Subjective Assessment - 11/28/16 1331    Subjective Was unaware that last appt she attended was her last scheduled so this is the first time she has returned in a while. Feels that she has made a lot of progress.             [facility] PT Assessment - 11/28/16 0001      Assessment   Medical Diagnosis Low back pain without sciatica   Referring Provider Pricilla Holm, MD   Onset Date/Surgical Date 08/25/16      Observation/Other Assessments   Focus on Therapeutic Outcomes (FOTO)  65% ability     AROM   Lumbar - Right Side Bend 30   Lumbar - Left Side Bend 26     Strength   Right Hip Extension 4+/5   Right Hip ABduction 5/5   Left Hip Extension 4+/5   Left Hip ABduction 5/5   Left Knee Extension 5/5                     [facility] Adult PT Treatment/Exercise - 11/28/16 0001      Exercises   Exercises Other Exercises   Other Exercises  leg press machine, cues for form     Lumbar Exercises: Standing   Functional Squats Limitations sumo & regular squats     Knee/Hip Exercises: Stretches   Passive Hamstring Stretch Limitations supine with green strap     Knee/Hip Exercises: Aerobic   Elliptical L1 ramp 10 4 min                PT Education - 11/28/16 1414    Education provided Yes   Education Details gym, posture, muscular awareness, exercise form/rationale   Person(s) Educated Patient   Methods Explanation;Demonstration;Tactile cues;Verbal cues   Comprehension Verbalized understanding;Verbal cues required;Tactile cues required;Returned demonstration;Need further instruction          PT Short Term Goals - 11/07/16 1507      PT SHORT TERM  GOAL #1   Title Pt will require minimal cuing and be able to verbalize HEP by 11/06/16   Baseline They are going well at home   Time 3   Period Weeks   Status Achieved     PT SHORT TERM GOAL #2   Title pt will report decrease in pain from a 6/10 to </= 3/10 during activity (11/06/16)   Baseline 5/10 with activity today   Time 3   Period Weeks   Status On-going     PT SHORT TERM GOAL #3   Title pt will improve ROM in all direction by >/= 8 degrees by (11/06/16)   Time 3   Period Weeks   Status Unable to assess           PT Long Term Goals - 11/28/16 1336      PT LONG TERM GOAL #1   Title pt will be independent with HEP by discharge (11/27/16)   Baseline overall feels like she is doing well, on average doing  every day   Status Achieved     PT LONG TERM GOAL #2   Title Pt will be able to work full shift and perform all work related activites with 0/10 pain (11/27/16)   Baseline feels a sore sensation rather than pain   Status Achieved     PT LONG TERM GOAL #3   Title pt will be able to return to working out 4 times a week with </= to 2/10 pain (11/27/16)   Baseline has not returned at this time, was waiting for the go ahead, reviewed workout options at d/c with pt   Status Not Met     PT LONG TERM GOAL #4   Title pt will increase her FOTO score to </= 28% limited to demonstrate improvement in functionat at discharge (11/27/2016)   Baseline 35% limited    Status Not Met     PT LONG TERM GOAL #5   Title pt will increase her LLE hip strength to >/= 4/5 with </= 2/10 pain to assist with ADLS and work related task (11/27/2016)   Baseline see flowsheet   Status Achieved               Plan - 11/28/16 1435    Clinical Impression Statement Pt has made excellent progress in PT and is ready for d/c to independent program. Disucssed importance of being aware of appropraite muscular contraction while at work and exercising to decrease overuse of QL. Reviewed gym exercises today which pt was able to perform appropriately. Pt was instructed to contact us with any further questions or concerns.    Consulted and Agree with Plan of Care Patient      Patient will benefit from skilled therapeutic intervention in order to improve the following deficits and impairments:     Visit Diagnosis: Chronic left-sided low back pain without sciatica - Plan: PT plan of care cert/re-cert  Muscle weakness (generalized) - Plan: PT plan of care cert/re-cert  Muscle spasm of back - Plan: PT plan of care cert/re-cert     Problem List Patient Active Problem List   Diagnosis Date Noted  . Acute bilateral low back pain without sciatica 09/11/2016  . Routine general medical examination at a health care facility  01/11/2016  . GERD (gastroesophageal reflux disease) 03/09/2014   PHYSICAL THERAPY DISCHARGE SUMMARY  Visits from Start of Care: 11  Current functional level related to goals / functional outcomes: See above   Remaining deficits:  See above   Education / Equipment: Anatomy of condition, POC, HEP, exercise form/rationale  Plan: Patient agrees to discharge.  Patient goals were partially met. Patient is being discharged due to being pleased with the current functional level.  ?????    Rhiann Boucher C. Joseh Sjogren PT, DPT 11/28/16 3:14 PM   [facility] [address] [place], [place], [address] Phone: [phone]   Fax:  [phone]  Name: Ebony Allen MRN: [id number] Date of Birth: 06/08/1971

## 2016-12-14 ENCOUNTER — Encounter: Payer: Self-pay | Admitting: Internal Medicine

## 2016-12-14 ENCOUNTER — Ambulatory Visit (INDEPENDENT_AMBULATORY_CARE_PROVIDER_SITE_OTHER): Payer: 59 | Admitting: Internal Medicine

## 2016-12-14 DIAGNOSIS — M545 Low back pain, unspecified: Secondary | ICD-10-CM

## 2016-12-14 MED ORDER — TRAMADOL HCL 50 MG PO TABS: 50.0000 mg | ORAL_TABLET | Freq: Three times a day (TID) | ORAL | 0 refills | Status: DC | PRN

## 2016-12-14 NOTE — Assessment & Plan Note (Signed)
She has had recurrence but did very well with PT. She does not have time in her schedule to do this. Will refer to sports medicine for possible manipulation. Refill tramadol and ibuprofen during the day.

## 2016-12-14 NOTE — Progress Notes (Signed)
Pre visit review using our clinic review tool, if applicable. No additional management support is needed unless otherwise documented below in the visit note. 

## 2016-12-14 NOTE — Progress Notes (Signed)
   Subjective:    Patient ID: Ebony Allen, female    DOB: 1971-06-04, 46 y.o.   MRN: [id number]  HPI The patient is a 46 YO female coming in for follow up of back pain. Went to PT and was working with them and doing very well, minimal pain. Then released and started working out again, now back is hurting again. She had tried to get back into exercise and was doing her usual routine. No injury or falls. Denies any radiation of the pain. No change in bowel or bladder. Going on for about 3-4 days now and stable since onset. Pain 5-6/10. Has not taken anything for it including otc pain meds.   Review of Systems  Constitutional: Negative for activity change, appetite change, fatigue, fever and unexpected weight change.  Cardiovascular: Negative.   Gastrointestinal: Negative.   Musculoskeletal: Positive for back pain. Negative for arthralgias, gait problem, joint swelling, myalgias and neck pain.  Skin: Negative.   Neurological: Negative.       Objective:   Physical Exam  Constitutional: She is oriented to person, place, and time. She appears well-developed and well-nourished.  HENT:  Head: Normocephalic and atraumatic.  Eyes: EOM are normal.  Neck: Normal range of motion.  Cardiovascular: Normal rate and regular rhythm.   Pulmonary/Chest: Effort normal. No respiratory distress. She has no wheezes.  Abdominal: Soft. She exhibits no distension. There is no tenderness. There is no rebound.  Musculoskeletal: She exhibits tenderness.  Pain in the low left back, no radiation  Neurological: She is alert and oriented to person, place, and time. No cranial nerve deficit. Coordination normal.  Skin: Skin is warm and dry.   Vitals:   12/14/16 1025  BP: 110/70  Pulse: 62  Resp: 14  Temp: 97.7 F (36.5 C)  TempSrc: Oral  SpO2: 98%  Weight: 143 lb (64.9 kg)  Height: 5\' 3"  (1.6 m)      Assessment & Plan:

## 2016-12-14 NOTE — Patient Instructions (Signed)
We have sent in the tramadol. Try taking ibuprofen during the day for pain and tramadol at night time.  Stop the back exercises for a week or so to see if this helps.
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
## 2017-01-03 ENCOUNTER — Encounter: Payer: Self-pay | Admitting: Family Medicine

## 2017-01-03 ENCOUNTER — Ambulatory Visit (INDEPENDENT_AMBULATORY_CARE_PROVIDER_SITE_OTHER): Payer: 59 | Admitting: Family Medicine

## 2017-01-03 DIAGNOSIS — M357 Hypermobility syndrome: Secondary | ICD-10-CM | POA: Diagnosis not present

## 2017-01-03 DIAGNOSIS — M999 Biomechanical lesion, unspecified: Secondary | ICD-10-CM | POA: Insufficient documentation

## 2017-01-03 DIAGNOSIS — M545 Low back pain, unspecified: Secondary | ICD-10-CM
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
## 2017-01-03 NOTE — Assessment & Plan Note (Signed)
Decision today to treat with OMT was based on Physical Exam  After verbal consent patient was treated with HVLA, ME, FPR techniques in cervical, thoracic, lumbar and sacral areas  Patient tolerated the procedure well with improvement in symptoms  Patient given exercises, stretches and lifestyle modifications  See medications in patient instructions if given  Patient will follow up in 3 weeks 

## 2017-01-03 NOTE — Progress Notes (Signed)
Pre-visit discussion using our clinic review tool. No additional management support is needed unless otherwise documented below in the visit note.  

## 2017-01-03 NOTE — Patient Instructions (Signed)
Good to see you.  Ice 20 minutes 2 times daily. Usually after activity and before bed. Exercises 3 times a week.  Vitamin D 2000 IU daily  Turmeric 500mg  daily  pennsaid pinkie amount topically 2 times daily as needed.   See me again in 3 weeks. I hope the manipulation helps

## 2017-01-04 ENCOUNTER — Encounter: Payer: Self-pay | Admitting: Family Medicine

## 2017-01-25 ENCOUNTER — Ambulatory Visit (INDEPENDENT_AMBULATORY_CARE_PROVIDER_SITE_OTHER): Payer: 59 | Admitting: Family Medicine

## 2017-01-25 ENCOUNTER — Encounter: Payer: Self-pay | Admitting: Family Medicine

## 2017-01-25 VITALS — BP 96/60 | HR 58 | Ht 63.0 in | Wt 147.0 lb

## 2017-01-25 DIAGNOSIS — M357 Hypermobility syndrome: Secondary | ICD-10-CM | POA: Diagnosis not present

## 2017-01-25 DIAGNOSIS — M999 Biomechanical lesion, unspecified: Secondary | ICD-10-CM

## 2017-01-25 DIAGNOSIS — M545 Low back pain, unspecified: Secondary | ICD-10-CM

## 2017-01-25 NOTE — Assessment & Plan Note (Signed)
Discussed the importance of stability and eccentric exercises.
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
## 2017-01-25 NOTE — Assessment & Plan Note (Signed)
Doing much better at this time. Patient will likely do well without any significant other changes. Patient will advance activities as tolerated. We discussed icing regimen, we discussed avoiding significant range of motion with the hypermobility syndrome. Patient will come back and see me again in 2-3 months for further evaluation and treatment.

## 2017-01-25 NOTE — Patient Instructions (Signed)
Goo to see you  Ebony Allen is your friend.  See me again in 2-3 months

## 2017-01-25 NOTE — Assessment & Plan Note (Signed)
Decision today to treat with OMT was based on Physical Exam  After verbal consent patient was treated with HVLA, ME, FPR techniques in cervical, thoracic, lumbar and sacral areas  Patient tolerated the procedure well with improvement in symptoms  Patient given exercises, stretches and lifestyle modifications  See medications in patient instructions if given  Patient will follow up in 8-12 weeks

## 2017-04-05 ENCOUNTER — Encounter: Payer: Self-pay | Admitting: Nurse Practitioner

## 2017-04-05 ENCOUNTER — Other Ambulatory Visit (INDEPENDENT_AMBULATORY_CARE_PROVIDER_SITE_OTHER): Payer: 59

## 2017-04-05 ENCOUNTER — Ambulatory Visit: Payer: 59 | Admitting: Internal Medicine

## 2017-04-05 ENCOUNTER — Ambulatory Visit (INDEPENDENT_AMBULATORY_CARE_PROVIDER_SITE_OTHER): Payer: 59 | Admitting: Nurse Practitioner

## 2017-04-05 VITALS — BP 98/74 | HR 57 | Temp 98.0°F | Ht 63.0 in | Wt 144.0 lb

## 2017-04-05 DIAGNOSIS — Z0001 Encounter for general adult medical examination with abnormal findings: Secondary | ICD-10-CM | POA: Diagnosis not present

## 2017-04-05 DIAGNOSIS — Z13220 Encounter for screening for lipoid disorders: Secondary | ICD-10-CM

## 2017-04-05 DIAGNOSIS — Z136 Encounter for screening for cardiovascular disorders: Secondary | ICD-10-CM | POA: Diagnosis not present

## 2017-04-05 LAB — COMPREHENSIVE METABOLIC PANEL
ALBUMIN: 4.1 g/dL (ref 3.5–5.2)
ALK PHOS: 46 U/L (ref 39–117)
ALT: 16 U/L (ref 0–35)
AST: 19 U/L (ref 0–37)
BUN: 14 mg/dL (ref 6–23)
CO2: 27 mEq/L (ref 19–32)
CREATININE: 0.98 mg/dL (ref 0.40–1.20)
Calcium: 9.2 mg/dL (ref 8.4–10.5)
Chloride: 105 mEq/L (ref 96–112)
GFR: 78.34 mL/min (ref >60.00)
Glucose, Bld: 58 mg/dL — ABNORMAL LOW (ref 70–99)
Potassium: 3.6 mEq/L (ref 3.5–5.1)
SODIUM: 140 meq/L (ref 135–145)
TOTAL PROTEIN: 7 g/dL (ref 6.0–8.3)
Total Bilirubin: 0.4 mg/dL (ref 0.2–1.2)

## 2017-04-05 LAB — LIPID PANEL
Cholesterol: 190 mg/dL (ref 0–200)
HDL: 55.3 mg/dL (ref >39.00)
LDL Cholesterol: 124 mg/dL — ABNORMAL HIGH (ref 0–99)
NONHDL: 134.67
Total CHOL/HDL Ratio: 3
Triglycerides: 51 mg/dL (ref 0.0–149.0)
VLDL: 10.2 mg/dL (ref 0.0–40.0)

## 2017-04-05 LAB — CBC
HEMATOCRIT: 43.1 % (ref 36.0–46.0)
Hemoglobin: 14.6 g/dL (ref 12.0–15.0)
MCHC: 33.8 g/dL (ref 30.0–36.0)
MCV: 93.4 fl (ref 78.0–100.0)
Platelets: 250 10*3/uL (ref 150.0–400.0)
RBC: 4.61 Mil/uL (ref 3.87–5.11)
RDW: 13.4 % (ref 11.5–15.5)
WBC: 4.4 10*3/uL (ref 4.0–10.5)

## 2017-04-05 LAB — TSH: TSH: 2.93 u[IU]/mL (ref 0.35–4.50)

## 2017-04-05 NOTE — Progress Notes (Signed)
Subjective:    Patient ID: Ebony Allen, female    DOB: [birth year], 46 y.o.   MRN: [id number]  Patient presents today for complete physical  HPI  Lower back pain: Stable, prn use of ibuprofen and staying active Completed sessions with Dr. Tamala Julian.  Immunizations: (TDAP, Hep C screen, Pneumovax, Influenza, zoster)  Health Maintenance  Topic Date Due  . Flu Shot  09/11/2017*  . HIV Screening  03/25/2018*  . Pap Smear  08/09/2018  . Tetanus Vaccine  01/10/2026  *Topic was postponed. The date shown is not the original due date.   Diet:heart healthy.  Weight:  Wt Readings from Last 3 Encounters:  04/05/17 144 lb (65.3 kg)  01/25/17 147 lb (66.7 kg)  01/03/17 145 lb (65.8 kg)   Fall Risk: Fall Risk  04/05/2017  Falls in the past year? No   Home Safety:home alone.  Depression/Suicide: Depression screen [facility] 2/9 04/05/2017  Decreased Interest 0  Down, Depressed, Hopeless 0  PHQ - 2 Score 0   Pap Smear (every 84yrs for >21-29 without HPV, every 66yrs for >30-42yrs with HPV):up to date, has irregular cycles, no contraceptive, not sexually active.  Vision:up to date.  Dental:up to date.  Advanced Directive: Advanced Directives 10/16/2016  Does Patient Have a Medical Advance Directive? No  Would patient like information on creating a medical advance directive? No - Patient declined   Sexual History (birth control, marital status, STD): single, not sexually active.  Medications and allergies reviewed with patient and updated if appropriate.  Patient Active Problem List   Diagnosis Date Noted  . Hypermobility syndrome 01/03/2017  . Nonallopathic lesion of thoracic region 01/03/2017  . Nonallopathic lesion of lumbosacral region 01/03/2017  . Nonallopathic lesion of sacral region 01/03/2017  . Acute bilateral low back pain without sciatica 09/11/2016  . Routine general medical examination at a health care facility 01/11/2016  . GERD (gastroesophageal reflux disease)  03/09/2014    Current Outpatient Prescriptions on File Prior to Visit  Medication Sig Dispense Refill  . Cholecalciferol (VITAMIN D) 2000 UNITS CAPS Take 1 capsule by mouth daily.    . Multiple Vitamins-Minerals (HAIR/SKIN/NAILS PO) Take 1 tablet by mouth daily.    . tranexamic acid (LYSTEDA) 650 MG TABS tablet      No current facility-administered medications on file prior to visit.     Past Medical History:  Diagnosis Date  . Anemia   . GERD (gastroesophageal reflux disease)     Past Surgical History:  Procedure Laterality Date  . DILATATION & CURETTAGE/HYSTEROSCOPY WITH MYOSURE N/A 11/17/2014   Procedure: DILATATION & CURETTAGE/HYSTEROSCOPY WITH MYOSURE;  Surgeon: Delsa Bern, MD;  Location: [facility];  Service: Gynecology;  Laterality: N/A;  . DILITATION & CURRETTAGE/HYSTROSCOPY WITH VERSAPOINT RESECTION N/A 05/20/2015   Procedure: DILATATION & CURETTAGE/HYSTEROSCOPY WITH VERSAPOINT RESECTION;  Surgeon: Delsa Bern, MD;  Location: [facility];  Service: Gynecology;  Laterality: N/A;  . esophageal dilation     Dr Ronalee Red  . ROBOT ASSISTED MYOMECTOMY N/A 11/17/2014   Procedure: ROBOTIC ASSISTED MYOMECTOMY;  Surgeon: Delsa Bern, MD;  Location: [facility];  Service: Gynecology;  Laterality: N/A;    Social History   Social History  . Marital status: Single    Spouse name: N/A  . Number of children: N/A  . Years of education: N/A   Social History Main Topics  . Smoking status: Never Smoker  . Smokeless tobacco: Never Used  . Alcohol use Yes     Comment:  socially  . Drug use:  No  . Sexual activity: Yes    Birth control/ protection: None   Other Topics Concern  . None   Social History Narrative  . None    No family history on file.      Review of Systems  Constitutional: Negative for fever, malaise/fatigue and weight loss.  HENT: Negative for congestion and sore throat.   Eyes:       Negative for visual changes  Respiratory: Negative for cough and shortness of  breath.   Cardiovascular: Negative for chest pain, palpitations and leg swelling.  Gastrointestinal: Negative for blood in stool, constipation, diarrhea and heartburn.  Genitourinary: Negative for dysuria, frequency and urgency.  Musculoskeletal: Positive for back pain. Negative for falls, joint pain and myalgias.  Skin: Negative for rash.  Neurological: Negative for dizziness, sensory change and headaches.  Endo/Heme/Allergies: Does not bruise/bleed easily.  Psychiatric/Behavioral: Negative for depression, substance abuse and suicidal ideas. The patient is not nervous/anxious.     Objective:   Vitals:   04/05/17 0950  BP: 98/74  Pulse: (!) 57  Temp: 98 F (36.7 C)    Body mass index is 25.51 kg/m.   Physical Examination:  Physical Exam  Constitutional: She is oriented to person, place, and time and well-developed, well-nourished, and in no distress. No distress.  HENT:  Right Ear: External ear normal.  Left Ear: External ear normal.  Nose: Nose normal.  Mouth/Throat: Oropharynx is clear and moist. No oropharyngeal exudate.  Eyes: Pupils are equal, round, and reactive to light. Conjunctivae and EOM are normal. No scleral icterus.  Neck: Normal range of motion. Neck supple. No thyromegaly present.  Cardiovascular: Normal rate, normal heart sounds and intact distal pulses.   Pulmonary/Chest: Effort normal and breath sounds normal. She exhibits no tenderness.  Abdominal: Soft. Bowel sounds are normal. She exhibits no distension. There is no tenderness.  Genitourinary:  Genitourinary Comments: Breast and pelvic exam deferred by patient to GYN  Musculoskeletal: Normal range of motion. She exhibits no edema or tenderness.  Lymphadenopathy:    She has no cervical adenopathy.  Neurological: She is alert and oriented to person, place, and time. Gait normal.  Skin: Skin is warm and dry.  Psychiatric: Affect and judgment normal.  Vitals reviewed.   ASSESSMENT and PLAN:  Ebony Allen  was seen today for follow-up.  Diagnoses and all orders for this visit:  Encounter for preventative adult health care exam with abnormal findings -     CBC; Future -     Comprehensive metabolic panel; Future -     TSH; Future -     Lipid panel; Future  Encounter for lipid screening for cardiovascular disease -     Lipid panel; Future   No problem-specific Assessment & Plan notes found for this encounter.    Recent Results (from the past 2160 hour(s))  CBC     Status: None   Collection Time: 04/05/17 10:21 AM  Result Value Ref Range   WBC 4.4 4.0 - 10.5 K/uL   RBC 4.61 3.87 - 5.11 Mil/uL   Platelets 250.0 150.0 - 400.0 K/uL   Hemoglobin 14.6 12.0 - 15.0 g/dL   HCT 43.1 36.0 - 46.0 %   MCV 93.4 78.0 - 100.0 fl   MCHC 33.8 30.0 - 36.0 g/dL   RDW 13.4 11.5 - 15.5 %  Comprehensive metabolic panel     Status: Abnormal   Collection Time: 04/05/17 10:21 AM  Result Value Ref Range   Sodium 140 135 - 145 mEq/L  Potassium 3.6 3.5 - 5.1 mEq/L   Chloride 105 96 - 112 mEq/L   CO2 27 19 - 32 mEq/L   Glucose, Bld 58 (L) 70 - 99 mg/dL   BUN 14 6 - 23 mg/dL   Creatinine, Ser 0.98 0.40 - 1.20 mg/dL   Total Bilirubin 0.4 0.2 - 1.2 mg/dL   Alkaline Phosphatase 46 39 - 117 U/L   AST 19 0 - 37 U/L   ALT 16 0 - 35 U/L   Total Protein 7.0 6.0 - 8.3 g/dL   Albumin 4.1 3.5 - 5.2 g/dL   Calcium 9.2 8.4 - 10.5 mg/dL   GFR 78.34 >60.00 mL/min  TSH     Status: None   Collection Time: 04/05/17 10:21 AM  Result Value Ref Range   TSH 2.93 0.35 - 4.50 uIU/mL  Lipid panel     Status: Abnormal   Collection Time: 04/05/17 10:21 AM  Result Value Ref Range   Cholesterol 190 0 - 200 mg/dL    Comment: ATP III Classification       Desirable:  < 200 mg/dL               Borderline High:  200 - 239 mg/dL          High:  > = 240 mg/dL   Triglycerides 51.0 0.0 - 149.0 mg/dL    Comment: Normal:  <150 mg/dLBorderline High:  150 - 199 mg/dL   HDL 55.30 >39.00 mg/dL   VLDL 10.2 0.0 - 40.0 mg/dL   LDL  Cholesterol 124 (H) 0 - 99 mg/dL   Total CHOL/HDL Ratio 3     Comment:                Men          Women1/2 Average Risk     3.4          3.3Average Risk          5.0          4.42X Average Risk          9.6          7.13X Average Risk          15.0          11.0                       NonHDL 134.67     Comment: NOTE:  Non-HDL goal should be 30 mg/dL higher than patient's LDL goal (i.e. LDL goal of < 70 mg/dL, would have non-HDL goal of < 100 mg/dL)   Follow up: Return if symptoms worsen or fail to improve.  Wilfred Lacy, NP

## 2017-04-05 NOTE — Patient Instructions (Signed)

## 2017-09-27 ENCOUNTER — Ambulatory Visit: Payer: 59 | Admitting: Internal Medicine

## 2017-09-27 ENCOUNTER — Ambulatory Visit (INDEPENDENT_AMBULATORY_CARE_PROVIDER_SITE_OTHER)
Admission: RE | Admit: 2017-09-27 | Discharge: 2017-09-27 | Disposition: A | Discharge status: Home / Self Care | Payer: 59 | Source: Ambulatory Visit | Attending: Internal Medicine | Admitting: Internal Medicine

## 2017-09-27 ENCOUNTER — Encounter: Payer: Self-pay | Admitting: Internal Medicine

## 2017-09-27 VITALS — BP 110/78 | HR 66 | Temp 97.6°F | Ht 63.0 in | Wt 150.0 lb

## 2017-09-27 DIAGNOSIS — M545 Low back pain, unspecified: Secondary | ICD-10-CM

## 2017-09-27 MED ORDER — CYCLOBENZAPRINE HCL 5 MG PO TABS: 5.0000 mg | ORAL_TABLET | Freq: Three times a day (TID) | ORAL | 1 refills | Status: DC | PRN

## 2017-09-27 MED ORDER — DICLOFENAC SODIUM 75 MG PO TBEC: 75.0000 mg | DELAYED_RELEASE_TABLET | Freq: Two times a day (BID) | ORAL | 0 refills | Status: DC

## 2017-09-27 NOTE — Patient Instructions (Signed)
We have sent in voltaren which is the anti inflammatory to take 1 pill twice a day for the next 2 weeks or so.   We have sent in flexeril (cyclobenzaprine) to use as the muscle relaxer up to 3 times a day for pain.  We are checking the x-ray today.
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
## 2017-12-26 ENCOUNTER — Encounter: Payer: Self-pay | Admitting: Family

## 2017-12-26 ENCOUNTER — Ambulatory Visit: Payer: 59 | Admitting: Family

## 2017-12-26 VITALS — BP 102/78 | HR 90 | Temp 99.0°F | Ht 63.0 in | Wt 139.1 lb

## 2017-12-26 DIAGNOSIS — J209 Acute bronchitis, unspecified: Secondary | ICD-10-CM

## 2017-12-26 MED ORDER — FLUTICASONE PROPIONATE 50 MCG/ACT NA SUSP: 2.0000 | Freq: Every day | NASAL | 6 refills | Status: DC

## 2017-12-26 MED ORDER — BENZONATATE 100 MG PO CAPS: 100.0000 mg | ORAL_CAPSULE | Freq: Three times a day (TID) | ORAL | 0 refills | Status: DC | PRN

## 2017-12-26 MED ORDER — AZITHROMYCIN 250 MG PO TABS: ORAL_TABLET | ORAL | 0 refills | Status: DC

## 2017-12-26 MED ORDER — FLUCONAZOLE 150 MG PO TABS: 150.0000 mg | ORAL_TABLET | Freq: Once | ORAL | 0 refills | Status: AC

## 2017-12-26 NOTE — Progress Notes (Signed)
Ebony Allen is a 47 y.o. female with the following history as recorded in EpicCare:  Patient Active Problem List   Diagnosis Date Noted  . Hypermobility syndrome 01/03/2017  . Nonallopathic lesion of thoracic region 01/03/2017  . Nonallopathic lesion of lumbosacral region 01/03/2017  . Nonallopathic lesion of sacral region 01/03/2017  . Acute bilateral low back pain without sciatica 09/11/2016  . Routine general medical examination at a health care facility 01/11/2016  . GERD (gastroesophageal reflux disease) 03/09/2014    Current Outpatient Medications  Medication Sig Dispense Refill  . Ascorbic Acid (VITAMIN C PO) Take by mouth.    Marland Kitchen BIOTIN PO Take by mouth.    . Cholecalciferol (VITAMIN D) 2000 UNITS CAPS Take 1 capsule by mouth daily.    . Cranberry 500 MG CHEW Chew 1 tablet by mouth 2 (two) times daily after a meal. 30 tablet 0  . IRON PO Take by mouth.    . Multiple Vitamins-Minerals (HAIR SKIN AND NAILS FORMULA PO) Hair,Skin and Nails    . Multiple Vitamins-Minerals (HAIR/SKIN/NAILS PO) Take 1 tablet by mouth daily.    . Omega-3 Fatty Acids (FISH OIL) 1000 MG CAPS Fish Oil    . tranexamic acid (LYSTEDA) 650 MG TABS tablet     . azithromycin (ZITHROMAX) 250 MG tablet 2 tabs po qd x 1 day; 1 tablet per day x 4 days; 6 tablet 0  . benzonatate (TESSALON) 100 MG capsule Take 1 capsule (100 mg total) by mouth 3 (three) times daily as needed. 20 capsule 0  . fluconazole (DIFLUCAN) 150 MG tablet Take 1 tablet (150 mg total) by mouth once for 1 dose. 1 tablet 0  . fluticasone (FLONASE) 50 MCG/ACT nasal spray Place 2 sprays into both nostrils daily. 16 g 6   No current facility-administered medications for this visit.     Allergies: Latex and Nickel  Past Medical History:  Diagnosis Date  . Anemia   . GERD (gastroesophageal reflux disease)     Past Surgical History:  Procedure Laterality Date  . DILATATION & CURETTAGE/HYSTEROSCOPY WITH MYOSURE N/A 11/17/2014   Procedure:  DILATATION & CURETTAGE/HYSTEROSCOPY WITH MYOSURE;  Surgeon: Delsa Bern, MD;  Location: [facility];  Service: Gynecology;  Laterality: N/A;  . DILITATION & CURRETTAGE/HYSTROSCOPY WITH VERSAPOINT RESECTION N/A 05/20/2015   Procedure: DILATATION & CURETTAGE/HYSTEROSCOPY WITH VERSAPOINT RESECTION;  Surgeon: Delsa Bern, MD;  Location: [facility];  Service: Gynecology;  Laterality: N/A;  . esophageal dilation     Dr Ronalee Red  . ROBOT ASSISTED MYOMECTOMY N/A 11/17/2014   Procedure: ROBOTIC ASSISTED MYOMECTOMY;  Surgeon: Delsa Bern, MD;  Location: [facility];  Service: Gynecology;  Laterality: N/A;    History reviewed. No pertinent family history.  Social History   Tobacco Use  . Smoking status: Never Smoker  . Smokeless tobacco: Never Used  Substance Use Topics  . Alcohol use: Yes    Comment:  socially    Subjective:  4 day history of cough/ congestion; + allergies/ no asthma; not prone to bronchitis; + low grade fever; notes that chest feels very tight; using OTC Coricidin and Robitussin;   Objective:  Vitals:   12/26/17 1320  BP: 102/78  Pulse: 90  Temp: 99 F (37.2 C)  TempSrc: Oral  SpO2: 91%  Weight: 139 lb 1.9 oz (63.1 kg)  Height: 5\' 3"  (1.6 m)    General: Well developed, well nourished, in no acute distress  Skin : Warm and dry.  Head: Normocephalic and atraumatic  Eyes: Sclera and conjunctiva  clear; pupils round and reactive to light; extraocular movements intact  Ears: External normal; canals clear; tympanic membranes congested bilaterally Oropharynx: Pink, supple. No suspicious lesions  Neck: Supple without thyromegaly, adenopathy  Lungs: Respirations unlabored; clear to auscultation bilaterally without wheeze, rales, rhonchi  CVS exam: normal rate and regular rhythm.  Neurologic: Alert and oriented; speech intact; face symmetrical; moves all extremities well; CNII-XII intact without focal deficit   Assessment:  1. Acute bronchitis, unspecified organism     Plan:  Suspect  allergy component; Rx for Z-pak #1 take as directed; Rx for [employer] and Flonase; increase fluids, rest; work note given as requested; follow- up worse, no better.   No follow-ups on file.  No orders of the defined types were placed in this encounter.   Requested Prescriptions   Signed Prescriptions Disp Refills  . fluticasone (FLONASE) 50 MCG/ACT nasal spray 16 g 6    Sig: Place 2 sprays into both nostrils daily.  Marland Kitchen azithromycin (ZITHROMAX) 250 MG tablet 6 tablet 0    Sig: 2 tabs po qd x 1 day; 1 tablet per day x 4 days;  . fluconazole (DIFLUCAN) 150 MG tablet 1 tablet 0    Sig: Take 1 tablet (150 mg total) by mouth once for 1 dose.  . benzonatate (TESSALON) 100 MG capsule 20 capsule 0    Sig: Take 1 capsule (100 mg total) by mouth 3 (three) times daily as needed.

## 2018-03-20 ENCOUNTER — Encounter: Payer: Self-pay | Admitting: Family

## 2018-03-20 ENCOUNTER — Ambulatory Visit: Payer: 59 | Admitting: Family

## 2018-03-20 VITALS — BP 110/76 | HR 58 | Temp 98.0°F | Ht 63.0 in | Wt 140.1 lb

## 2018-03-20 DIAGNOSIS — M545 Low back pain, unspecified: Secondary | ICD-10-CM

## 2018-03-20 DIAGNOSIS — M62838 Other muscle spasm: Secondary | ICD-10-CM | POA: Diagnosis not present

## 2018-03-20 DIAGNOSIS — M542 Cervicalgia: Secondary | ICD-10-CM | POA: Diagnosis not present

## 2018-03-20 DIAGNOSIS — M25511 Pain in right shoulder: Secondary | ICD-10-CM | POA: Diagnosis not present

## 2018-03-20 MED ORDER — NAPROXEN 500 MG PO TABS: 500.0000 mg | ORAL_TABLET | Freq: Two times a day (BID) | ORAL | 0 refills | Status: DC

## 2018-03-20 MED ORDER — TRAMADOL HCL 50 MG PO TABS: 50.0000 mg | ORAL_TABLET | Freq: Three times a day (TID) | ORAL | 0 refills | Status: DC | PRN

## 2018-03-20 MED ORDER — METHOCARBAMOL 500 MG PO TABS: 500.0000 mg | ORAL_TABLET | Freq: Three times a day (TID) | ORAL | 0 refills | Status: DC | PRN
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
## 2018-04-04 ENCOUNTER — Ambulatory Visit: Payer: 59 | Admitting: Internal Medicine

## 2018-04-04 ENCOUNTER — Encounter: Payer: Self-pay | Admitting: Internal Medicine

## 2018-04-04 VITALS — BP 110/70 | HR 46 | Temp 98.3°F | Ht 63.0 in | Wt 140.0 lb

## 2018-04-04 DIAGNOSIS — M545 Low back pain, unspecified: Secondary | ICD-10-CM

## 2018-04-04 MED ORDER — METHOCARBAMOL 500 MG PO TABS: 500.0000 mg | ORAL_TABLET | Freq: Three times a day (TID) | ORAL | 0 refills | Status: DC | PRN

## 2018-04-04 NOTE — Patient Instructions (Signed)
We will get you in with physical therapy and have refilled the muscle relaxer.   If needed we can also get you in with sports medicine for the adjustment.    Back Exercises If you have pain in your back, do these exercises 2-3 times each day or as told by your doctor. When the pain goes away, do the exercises once each day, but repeat the steps more times for each exercise (do more repetitions). If you do not have pain in your back, do these exercises once each day or as told by your doctor. Exercises Single Knee to Chest  Do these steps 3-5 times in a row for each leg: 1. Lie on your back on a firm bed or the floor with your legs stretched out. 2. Bring one knee to your chest. 3. Hold your knee to your chest by grabbing your knee or thigh. 4. Pull on your knee until you feel a gentle stretch in your lower back. 5. Keep doing the stretch for 10-30 seconds. 6. Slowly let go of your leg and straighten it.  Pelvic Tilt  Do these steps 5-10 times in a row: 1. Lie on your back on a firm bed or the floor with your legs stretched out. 2. Bend your knees so they point up to the ceiling. Your feet should be flat on the floor. 3. Tighten your lower belly (abdomen) muscles to press your lower back against the floor. This will make your tailbone point up to the ceiling instead of pointing down to your feet or the floor. 4. Stay in this position for 5-10 seconds while you gently tighten your muscles and breathe evenly.  Cat-Cow  Do these steps until your lower back bends more easily: 1. Get on your hands and knees on a firm surface. Keep your hands under your shoulders, and keep your knees under your hips. You may put padding under your knees. 2. Let your head hang down, and make your tailbone point down to the floor so your lower back is round like the back of a cat. 3. Stay in this position for 5 seconds. 4. Slowly lift your head and make your tailbone point up to the ceiling so your back hangs  low (sags) like the back of a cow. 5. Stay in this position for 5 seconds.  Press-Ups  Do these steps 5-10 times in a row: 1. Lie on your belly (face-down) on the floor. 2. Place your hands near your head, about shoulder-width apart. 3. While you keep your back relaxed and keep your hips on the floor, slowly straighten your arms to raise the top half of your body and lift your shoulders. Do not use your back muscles. To make yourself more comfortable, you may change where you place your hands. 4. Stay in this position for 5 seconds. 5. Slowly return to lying flat on the floor.  Bridges  Do these steps 10 times in a row: 1. Lie on your back on a firm surface. 2. Bend your knees so they point up to the ceiling. Your feet should be flat on the floor. 3. Tighten your butt muscles and lift your butt off of the floor until your waist is almost as high as your knees. If you do not feel the muscles working in your butt and the back of your thighs, slide your feet 1-2 inches farther away from your butt. 4. Stay in this position for 3-5 seconds. 5. Slowly lower your butt to the  floor, and let your butt muscles relax.  If this exercise is too easy, try doing it with your arms crossed over your chest. Belly Crunches  Do these steps 5-10 times in a row: 1. Lie on your back on a firm bed or the floor with your legs stretched out. 2. Bend your knees so they point up to the ceiling. Your feet should be flat on the floor. 3. Cross your arms over your chest. 4. Tip your chin a little bit toward your chest but do not bend your neck. 5. Tighten your belly muscles and slowly raise your chest just enough to lift your shoulder blades a tiny bit off of the floor. 6. Slowly lower your chest and your head to the floor.  Back Lifts Do these steps 5-10 times in a row: 1. Lie on your belly (face-down) with your arms at your sides, and rest your forehead on the floor. 2. Tighten the muscles in your legs and  your butt. 3. Slowly lift your chest off of the floor while you keep your hips on the floor. Keep the back of your head in line with the curve in your back. Look at the floor while you do this. 4. Stay in this position for 3-5 seconds. 5. Slowly lower your chest and your face to the floor.  Contact a doctor if:  Your back pain gets a lot worse when you do an exercise.  Your back pain does not lessen 2 hours after you exercise. If you have any of these problems, stop doing the exercises. Do not do them again unless your doctor says it is okay. Get help right away if:  You have sudden, very bad back pain. If this happens, stop doing the exercises. Do not do them again unless your doctor says it is okay. This information is not intended to replace advice given to you by your health care provider. Make sure you discuss any questions you have with your health care provider. Document Released: 10/14/2010 Document Revised: 02/17/2016 Document Reviewed: 11/05/2014 Elsevier Interactive Patient Education  [employer].
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
## 2018-04-26 ENCOUNTER — Encounter: Payer: Self-pay | Admitting: Physical Therapy

## 2018-04-26 ENCOUNTER — Ambulatory Visit: Payer: 59 | Attending: Internal Medicine | Admitting: Physical Therapy

## 2018-04-26 DIAGNOSIS — M545 Low back pain, unspecified: Secondary | ICD-10-CM

## 2018-04-26 DIAGNOSIS — M62830 Muscle spasm of back: Secondary | ICD-10-CM | POA: Insufficient documentation

## 2018-04-26 DIAGNOSIS — M542 Cervicalgia: Secondary | ICD-10-CM | POA: Diagnosis present

## 2018-04-26 DIAGNOSIS — M6281 Muscle weakness (generalized): Secondary | ICD-10-CM
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
## 2018-05-03 ENCOUNTER — Ambulatory Visit: Payer: 59 | Admitting: Physical Therapy

## 2018-05-03 ENCOUNTER — Encounter: Payer: Self-pay | Admitting: Physical Therapy

## 2018-05-03 DIAGNOSIS — M545 Low back pain, unspecified: Secondary | ICD-10-CM

## 2018-05-03 DIAGNOSIS — M62830 Muscle spasm of back: Secondary | ICD-10-CM

## 2018-05-03 DIAGNOSIS — M6281 Muscle weakness (generalized): Secondary | ICD-10-CM

## 2018-05-03 DIAGNOSIS — M542 Cervicalgia: Secondary | ICD-10-CM

## 2018-05-03 NOTE — Therapy (Signed)
[place], [place], [address] Phone: [phone]   Fax:  [phone]  Physical Therapy Treatment  Patient Details  Name: Ebony Allen MRN: [id number] Date of Birth: Feb 05, 1971 Referring Provider: Sharlet Salina   Encounter Date: 05/03/2018  PT End of Session - 05/03/18 0833    Visit Number  2    Date for PT Re-Evaluation  06/26/18    PT Start Time  0807    PT Stop Time  0855    PT Time Calculation (min)  48 min       Past Medical History:  Diagnosis Date  . Anemia   . GERD (gastroesophageal reflux disease)     Past Surgical History:  Procedure Laterality Date  . DILATATION & CURETTAGE/HYSTEROSCOPY WITH MYOSURE N/A 11/17/2014   Procedure: DILATATION & CURETTAGE/HYSTEROSCOPY WITH MYOSURE;  Surgeon: Delsa Bern, MD;  Location: [facility];  Service: Gynecology;  Laterality: N/A;  . DILITATION & CURRETTAGE/HYSTROSCOPY WITH VERSAPOINT RESECTION N/A 05/20/2015   Procedure: DILATATION & CURETTAGE/HYSTEROSCOPY WITH VERSAPOINT RESECTION;  Surgeon: Delsa Bern, MD;  Location: [facility];  Service: Gynecology;  Laterality: N/A;  . esophageal dilation     Dr Ronalee Red  . ROBOT ASSISTED MYOMECTOMY N/A 11/17/2014   Procedure: ROBOTIC ASSISTED MYOMECTOMY;  Surgeon: Delsa Bern, MD;  Location: [facility];  Service: Gynecology;  Laterality: N/A;    There were no vitals filed for this visit.  Subjective Assessment - 05/03/18 0809    Subjective  7 min late- "okay just alittle pain in bottom of spine"    Currently in Pain?  Yes    Pain Score  7     Pain Location  Back    Pain Orientation  Lower                       [facility] Adult PT Treatment/Exercise - 05/03/18 0001      Exercises   Exercises  Neck;Shoulder;Lumbar      Neck Exercises: Machines for Strengthening   UBE (Upper Arm Bike)  L 2 2 fwd/2 back      Lumbar Exercises: Stretches   Active Hamstring Stretch  Left;Right;3 reps;10 seconds   added to  HEP     Lumbar Exercises: Aerobic   Nustep  L 3 5 min      Lumbar Exercises: Standing   Other Standing Lumbar Exercises  modified dead lift 3# 3 sets 5      Lumbar Exercises: Seated   Other Seated Lumbar Exercises  pelvic ROM on sit fit 15 each 4 way    Other Seated Lumbar Exercises  LAQ on sit fit 10 each      Lumbar Exercises: Supine   Ab Set  15 reps    Other Supine Lumbar Exercises  bridge, KTC and obl 15 each with ball      Moist Heat Therapy   Number Minutes Moist Heat  15 Minutes    Moist Heat Location  Lumbar Spine;Cervical      Electrical Stimulation   Electrical Stimulation Location  L/S spine    Electrical Stimulation Action  IFC    Electrical Stimulation Parameters  aupine    Electrical Stimulation Goals  Pain      Manual Therapy   Manual Therapy  Passive ROM    Manual therapy comments  very tight and guarded esp HS , difficulty relaxing even with cuing    Passive ROM  LE and trunk  PT Education - 05/03/18 [id number]    Education Details  active seated HS stretch    Person(s) Educated  Patient    Methods  Explanation;Demonstration    Comprehension  Verbalized understanding;Returned demonstration       PT Short Term Goals - 05/03/18 0834      PT SHORT TERM GOAL #1   Title  Pt will require minimal cuing and be able to verbalize HEP     Status  Achieved        PT Long Term Goals - 04/26/18 1017      PT LONG TERM GOAL #1   Title  pt will be independent with HEP     Time  8    Period  Weeks    Status  New      PT LONG TERM GOAL #2   Title  Pt will be able to work full shift and perform all work related activites with 0/10 pain)    Time  8    Period  Weeks    Status  New      PT LONG TERM GOAL #3   Title  pt will be able to return to working out 4 times a week with </= to 2/10 pain    Time  8    Period  Weeks    Status  New      PT LONG TERM GOAL #4   Title  increase SLR to 60 degrees    Time  8    Period  Weeks    Status  New             Plan - 05/03/18 0630    Clinical Impression Statement  STG of initial HEP met. Tolerated initial ther ex progression fair with ppostural cuing needed and some pain limitations. added active HS stretch for HEP - pt did better with active stretch as she was very guarded with passive    PT Treatment/Interventions  ADLs/Self Care Home Management;Cryotherapy;Electrical Stimulation;Moist Heat;Therapeutic exercise;Therapeutic activities;Neuromuscular re-education;Manual techniques;Patient/family education;Dry needling    PT Next Visit Plan  assess HEPs for stretching and add core stab ex and HEP. very tight       Patient will benefit from skilled therapeutic intervention in order to improve the following deficits and impairments:  Decreased range of motion, Increased muscle spasms, Pain, Impaired flexibility, Improper body mechanics, Decreased strength, Decreased mobility  Visit Diagnosis: Acute bilateral low back pain without sciatica  Muscle weakness (generalized)  Muscle spasm of back  Cervicalgia     Problem List Patient Active Problem List   Diagnosis Date Noted  . Hypermobility syndrome 01/03/2017  . Nonallopathic lesion of thoracic region 01/03/2017  . Nonallopathic lesion of lumbosacral region 01/03/2017  . Nonallopathic lesion of sacral region 01/03/2017  . Acute bilateral low back pain without sciatica 09/11/2016  . Routine general medical examination at a health care facility 01/11/2016  . GERD (gastroesophageal reflux disease) 03/09/2014    PAYSEUR,ANGIE PTA 05/03/2018, 8:39 AM  [place], [place], [address] Phone: [phone]   Fax:  [phone]  Name: Ebony Allen MRN: [id number] Date of Birth: 1971-06-11

## 2018-05-07 ENCOUNTER — Ambulatory Visit: Payer: 59 | Admitting: Physical Therapy

## 2018-05-09 ENCOUNTER — Ambulatory Visit: Payer: 59 | Admitting: Physical Therapy

## 2018-05-13 ENCOUNTER — Ambulatory Visit: Payer: 59 | Admitting: Physical Therapy

## 2018-05-13 DIAGNOSIS — M542 Cervicalgia: Secondary | ICD-10-CM

## 2018-05-13 DIAGNOSIS — M545 Low back pain, unspecified: Secondary | ICD-10-CM

## 2018-05-13 DIAGNOSIS — M6281 Muscle weakness (generalized): Secondary | ICD-10-CM | POA: Diagnosis not present

## 2018-05-13 DIAGNOSIS — M62830 Muscle spasm of back: Secondary | ICD-10-CM

## 2018-05-13 NOTE — Therapy (Signed)
[place], [place], [address] Phone: [phone]   Fax:  [phone]  Physical Therapy Treatment  Patient Details  Name: Ebony Allen MRN: [id number] Date of Birth: Feb 06, 1971 Referring Provider: Sharlet Salina   Encounter Date: 05/13/2018  PT End of Session - 05/13/18 1353    Visit Number  3    Date for PT Re-Evaluation  06/26/18    PT Start Time  1350    PT Stop Time  1450    PT Time Calculation (min)  60 min    Activity Tolerance  Patient tolerated treatment well    Behavior During Therapy  [facility] for tasks assessed/performed       Past Medical History:  Diagnosis Date  . Anemia   . GERD (gastroesophageal reflux disease)     Past Surgical History:  Procedure Laterality Date  . DILATATION & CURETTAGE/HYSTEROSCOPY WITH MYOSURE N/A 11/17/2014   Procedure: DILATATION & CURETTAGE/HYSTEROSCOPY WITH MYOSURE;  Surgeon: Delsa Bern, MD;  Location: [facility];  Service: Gynecology;  Laterality: N/A;  . DILITATION & CURRETTAGE/HYSTROSCOPY WITH VERSAPOINT RESECTION N/A 05/20/2015   Procedure: DILATATION & CURETTAGE/HYSTEROSCOPY WITH VERSAPOINT RESECTION;  Surgeon: Delsa Bern, MD;  Location: [facility];  Service: Gynecology;  Laterality: N/A;  . esophageal dilation     Dr Ronalee Red  . ROBOT ASSISTED MYOMECTOMY N/A 11/17/2014   Procedure: ROBOTIC ASSISTED MYOMECTOMY;  Surgeon: Delsa Bern, MD;  Location: [facility];  Service: Gynecology;  Laterality: N/A;    There were no vitals filed for this visit.  Subjective Assessment - 05/13/18 1354    Subjective  Patient reports her back is what's bothering her the most.    Limitations  House hold activities;Lifting;Walking;Standing    Patient Stated Goals  have less pain, work out    Currently in Pain?  Yes    Pain Score  6     Pain Location  Back    Pain Orientation  Lower    Pain Descriptors / Indicators  Aching                       [facility] Adult PT  Treatment/Exercise - 05/13/18 0001      Exercises   Exercises  Lumbar      Neck Exercises: Machines for Strengthening   UBE (Upper Arm Bike)  L 2 2 fwd/2 back      Lumbar Exercises: Stretches   Passive Hamstring Stretch  Right;Left;1 rep;30 seconds    Hip Flexor Stretch  Right;2 reps;60 seconds    Hip Flexor Stretch Limitations  one of EOB; one in 1/2 kneeling given for HEP    Standing Side Bend Limitations  Done in supine x 3 min    Piriformis Stretch  Left;1 rep;60 seconds      Lumbar Exercises: Aerobic   Nustep  L 4 5 min      Modalities   Modalities  Electrical Stimulation;Moist Heat      Moist Heat Therapy   Number Minutes Moist Heat  15 Minutes    Moist Heat Location  Lumbar Spine      Electrical Stimulation   Electrical Stimulation Location  L/S spiine    Electrical Stimulation Action  IFC    Electrical Stimulation Parameters  supine    Electrical Stimulation Goals  Pain      Manual Therapy   Manual Therapy  Passive ROM;Joint mobilization    Manual therapy comments  VCs to breathe  Joint Mobilization  CPA and UPA gd II to lumbar spine bil    Passive ROM  LLE             PT Education - 05/13/18 1554    Education Details  Reviewed HEP; discussed need to hold longer stretches and breathing while stretching    Person(s) Educated  Patient    Methods  Explanation;Demonstration;Verbal cues;Handout    Comprehension  Verbalized understanding;Returned demonstration       PT Short Term Goals - 05/03/18 0834      PT SHORT TERM GOAL #1   Title  Pt will require minimal cuing and be able to verbalize HEP     Status  Achieved        PT Long Term Goals - 04/26/18 1017      PT LONG TERM GOAL #1   Title  pt will be independent with HEP     Time  8    Period  Weeks    Status  New      PT LONG TERM GOAL #2   Title  Pt will be able to work full shift and perform all work related activites with 0/10 pain)    Time  8    Period  Weeks    Status  New      PT  LONG TERM GOAL #3   Title  pt will be able to return to working out 4 times a week with </= to 2/10 pain    Time  8    Period  Weeks    Status  New      PT LONG TERM GOAL #4   Title  increase SLR to 60 degrees    Time  8    Period  Weeks    Status  New            Plan - 05/13/18 1550    Clinical Impression Statement  Patient did well today with treatment. We focused mainly on stretching. She would benefit from further lumbar mobs for muscle relaxation.    PT Treatment/Interventions  ADLs/Self Care Home Management;Cryotherapy;Electrical Stimulation;Moist Heat;Therapeutic exercise;Therapeutic activities;Neuromuscular re-education;Manual techniques;Patient/family education;Dry needling    PT Next Visit Plan  add core stab ex; continue to work on flexibility, lumbar mobs for muscle relaxation    PT Home Exercise Plan  1/2 kneel HF stretch        Patient will benefit from skilled therapeutic intervention in order to improve the following deficits and impairments:  Decreased range of motion, Increased muscle spasms, Pain, Impaired flexibility, Improper body mechanics, Decreased strength, Decreased mobility  Visit Diagnosis: Acute bilateral low back pain without sciatica  Muscle weakness (generalized)  Muscle spasm of back  Cervicalgia     Problem List Patient Active Problem List   Diagnosis Date Noted  . Hypermobility syndrome 01/03/2017  . Nonallopathic lesion of thoracic region 01/03/2017  . Nonallopathic lesion of lumbosacral region 01/03/2017  . Nonallopathic lesion of sacral region 01/03/2017  . Acute bilateral low back pain without sciatica 09/11/2016  . Routine general medical examination at a health care facility 01/11/2016  . GERD (gastroesophageal reflux disease) 03/09/2014    Lang Zingg PT 05/13/2018, 3:55 PM  [place], [place], [address] Phone: [phone]   Fax:   [phone]  Name: Ebony Allen MRN: [id number] Date of Birth: 1971-04-10

## 2018-05-13 NOTE — Patient Instructions (Signed)
Hip Flexor Stretch   Kneel as shown. Interlace fingers on top of right knee. Keeping trunk straight and contracting abdominal muscles, slowly shift weight forward. Continue breathing normally and hold position for 30 -60 seconds. Repeat on other leg. Alternate sides _3__ times. Do _2-3__ times per day.   ALSO LIE ON YOUR BACK WITH YOUR HIPS TO THE LEFT AND YOUR SHOULDERS AND FEET TO THE LEFT. HOLD UP TO 5 MIN  PER SIDE. 1-2 X/DAY.   Madelyn Flavors, PT 05/13/18 2:32 PM  [place] [address] [place], [place], [address] Phone: [phone]   Fax:  [phone]

## 2018-05-16 ENCOUNTER — Ambulatory Visit: Payer: 59 | Admitting: Physical Therapy

## 2018-05-17 ENCOUNTER — Ambulatory Visit: Payer: 59 | Admitting: Physical Therapy

## 2018-05-17 DIAGNOSIS — M6281 Muscle weakness (generalized): Secondary | ICD-10-CM

## 2018-05-17 DIAGNOSIS — M545 Low back pain, unspecified: Secondary | ICD-10-CM

## 2018-05-17 DIAGNOSIS — M62830 Muscle spasm of back: Secondary | ICD-10-CM

## 2018-05-17 NOTE — Therapy (Signed)
[place], [place], [address] Phone: [phone]   Fax:  [phone]  Physical Therapy Treatment  Patient Details  Name: Deshanna Kama MRN: [id number] Date of Birth: [birth year] Referring Provider: Sharlet Salina   Encounter Date: 05/17/2018  PT End of Session - 05/17/18 1139    Visit Number  4    Date for PT Re-Evaluation  06/26/18    PT Start Time  3151    PT Stop Time  1115    PT Time Calculation (min)  60 min    Activity Tolerance  Patient tolerated treatment well    Behavior During Therapy  [facility] for tasks assessed/performed       Past Medical History:  Diagnosis Date   Anemia    GERD (gastroesophageal reflux disease)     Past Surgical History:  Procedure Laterality Date   DILATATION & CURETTAGE/HYSTEROSCOPY WITH MYOSURE N/A 11/17/2014   Procedure: [place];  Surgeon: Delsa Bern, MD;  Location: [facility];  Service: Gynecology;  Laterality: N/A;   DILITATION & CURRETTAGE/HYSTROSCOPY WITH VERSAPOINT RESECTION N/A 05/20/2015   Procedure: DILATATION & CURETTAGE/HYSTEROSCOPY WITH VERSAPOINT RESECTION;  Surgeon: Delsa Bern, MD;  Location: [facility];  Service: Gynecology;  Laterality: N/A;   esophageal dilation     Dr Ronalee Red   ROBOT ASSISTED MYOMECTOMY N/A 11/17/2014   Procedure: ROBOTIC ASSISTED MYOMECTOMY;  Surgeon: Delsa Bern, MD;  Location: [facility];  Service: Gynecology;  Laterality: N/A;    There were no vitals filed for this visit.  Subjective Assessment - 05/17/18 1141    Subjective  Ongoing Left central low back pain with soreness extending down to Left upper butock    Currently in Pain?  Yes    Pain Score  4     Pain Orientation  Left    Pain Descriptors / Indicators  Sore                       [facility] Adult PT Treatment/Exercise - 05/17/18 0001      Lumbar Exercises: Stretches   Passive Hamstring Stretch  Left;Right;3  reps;30 seconds    Hip Flexor Stretch  Right;Left;3 reps;30 seconds    Piriformis Stretch  Right;Left;3 reps;30 seconds      Lumbar Exercises: Seated   Other Seated Lumbar Exercises  seated march with lumbopelvic stabilization      Lumbar Exercises: Supine   Ab Set  10 reps;5 seconds    Other Supine Lumbar Exercises  PPT w/ add squeeze, PPT w/ hipabd/ Er, PPT w/ bridge, PPt w? march      Modalities   Modalities  Electrical Stimulation;Moist Heat      Moist Heat Therapy   Number Minutes Moist Heat  15 Minutes    Moist Heat Location  Lumbar Spine      Electrical Stimulation   Electrical Stimulation Location  L/S spiine    Electrical Stimulation Action  premod    Electrical Stimulation Parameters  hi    Electrical Stimulation Goals  Pain               PT Short Term Goals - 05/03/18 0834      PT SHORT TERM GOAL #1   Title  Pt will require minimal cuing and be able to verbalize HEP     Status  Achieved        PT Long Term Goals - 04/26/18 1017      PT  LONG TERM GOAL #1   Title  pt will be independent with HEP     Time  8    Period  Weeks    Status  New      PT LONG TERM GOAL #2   Title  Pt will be able to work full shift and perform all work related activites with 0/10 pain)    Time  8    Period  Weeks    Status  New      PT LONG TERM GOAL #3   Title  pt will be able to return to working out 4 times a week with </= to 2/10 pain    Time  8    Period  Weeks    Status  New      PT LONG TERM GOAL #4   Title  increase SLR to 60 degrees    Time  8    Period  Weeks    Status  New            Plan - 05/17/18 1139    Clinical Impression Statement  Tolerates treatment well.  Generalized ongoing LBP without radicular component or direction o f mechanical preference.  Will benefit from cont mobility and lumbopelvic stabilization exercises.         Patient will benefit from skilled therapeutic intervention in order to improve the following deficits and  impairments:     Visit Diagnosis: Acute bilateral low back pain without sciatica  Muscle weakness (generalized)  Muscle spasm of back     Problem List Patient Active Problem List   Diagnosis Date Noted   Hypermobility syndrome 01/03/2017   Nonallopathic lesion of thoracic region 01/03/2017   Nonallopathic lesion of lumbosacral region 01/03/2017   Nonallopathic lesion of sacral region 01/03/2017   Acute bilateral low back pain without sciatica 09/11/2016   Routine general medical examination at a health care facility 01/11/2016   GERD (gastroesophageal reflux disease) 03/09/2014    Olean Ree, PTA 05/17/2018, 11:43 AM  [place], [place], [address] Phone: [phone]   Fax:  [phone]  Name: Devaeh Amadi MRN: [id number] Date of Birth: Jan 03, 1971

## 2018-05-20 ENCOUNTER — Encounter: Payer: Self-pay | Admitting: Physical Therapy

## 2018-05-20 ENCOUNTER — Ambulatory Visit: Payer: 59 | Admitting: Physical Therapy

## 2018-05-20 DIAGNOSIS — M6281 Muscle weakness (generalized): Secondary | ICD-10-CM | POA: Diagnosis not present

## 2018-05-20 DIAGNOSIS — M545 Low back pain, unspecified: Secondary | ICD-10-CM

## 2018-05-20 NOTE — Therapy (Signed)
[place], [place], [address] Phone: [phone]   Fax:  [phone]  Physical Therapy Treatment  Patient Details  Name: Ebony Allen MRN: [id number] Date of Birth: July 31, 1971 Referring Provider: Sharlet Salina   Encounter Date: 05/20/2018  PT End of Session - 05/20/18 1540    Visit Number  5    Date for PT Re-Evaluation  06/26/18    PT Start Time  0867    PT Stop Time  1617    PT Time Calculation (min)  62 min    Activity Tolerance  Patient tolerated treatment well    Behavior During Therapy  [facility] for tasks assessed/performed       Past Medical History:  Diagnosis Date  . Anemia   . GERD (gastroesophageal reflux disease)     Past Surgical History:  Procedure Laterality Date  . DILATATION & CURETTAGE/HYSTEROSCOPY WITH MYOSURE N/A 11/17/2014   Procedure: DILATATION & CURETTAGE/HYSTEROSCOPY WITH MYOSURE;  Surgeon: Delsa Bern, MD;  Location: [facility];  Service: Gynecology;  Laterality: N/A;  . DILITATION & CURRETTAGE/HYSTROSCOPY WITH VERSAPOINT RESECTION N/A 05/20/2015   Procedure: DILATATION & CURETTAGE/HYSTEROSCOPY WITH VERSAPOINT RESECTION;  Surgeon: Delsa Bern, MD;  Location: [facility];  Service: Gynecology;  Laterality: N/A;  . esophageal dilation     Dr Ronalee Red  . ROBOT ASSISTED MYOMECTOMY N/A 11/17/2014   Procedure: ROBOTIC ASSISTED MYOMECTOMY;  Surgeon: Delsa Bern, MD;  Location: [facility];  Service: Gynecology;  Laterality: N/A;    There were no vitals filed for this visit.  Subjective Assessment - 05/20/18 1520    Subjective  "It is going ok, not as bad as it has been"    Currently in Pain?  Yes    Pain Score  5     Pain Location  Back    Pain Orientation  Left                       [facility] Adult PT Treatment/Exercise - 05/20/18 0001      Exercises   Exercises  Lumbar      Lumbar Exercises: Stretches   Passive Hamstring Stretch  Left;Right;4 reps;10 seconds    Single Knee to Chest Stretch  Left;Right;3 reps;10 seconds    Double Knee to Chest Stretch  3 reps;10 seconds    Piriformis Stretch  Right;Left;3 reps;10 seconds      Lumbar Exercises: Aerobic   Nustep  L 4 6 min      Lumbar Exercises: Standing   Row  20 reps;Theraband;Both    Theraband Level (Row)  Level 2 (Red)    Shoulder Extension  Theraband;20 reps;Both    Theraband Level (Shoulder Extension)  Level 2 (Red)      Lumbar Exercises: Seated   Sit to Stand  5 reps   holding  red ball, 2nd ands 3rd set of 5 no ball    Other Seated Lumbar Exercises  pelvic ROM on sit fit 10 each 4 way, LAQ  & marching on sif fit 2lb 2x10     Other Seated Lumbar Exercises  OHP red ball 2x10       Lumbar Exercises: Supine   Ab Set  2 seconds;10 reps;5 reps    Bridge  Compliant;2 seconds;10 reps    Other Supine Lumbar Exercises  bridge, KTC 10 each with ball      Modalities   Modalities  Electrical Stimulation;Moist Heat      Moist Heat Therapy  Number Minutes Moist Heat  15 Minutes    Moist Heat Location  Lumbar Spine      Electrical Stimulation   Electrical Stimulation Location  lumbar spine    Electrical Stimulation Action  IFC    Electrical Stimulation Parameters  supine    Electrical Stimulation Goals  Pain               PT Short Term Goals - 05/20/18 1607      PT SHORT TERM GOAL #1   Title  Pt will require minimal cuing and be able to verbalize HEP         PT Long Term Goals - 05/20/18 1607      PT LONG TERM GOAL #1   Title  pt will be independent with HEP       PT LONG TERM GOAL #2   Title  Pt will be able to work full shift and perform all work related activites with 0/10 pain)    Status  On-going            Plan - 05/20/18 1605    Clinical Impression Statement  Pt tolerated today's treatment well. She did report some initial tightness in her low back with sit to stands that eased as she progressed. Pt stated she has a pulling sensation in her back with LAQ.  Tight Hs and piriforms noted with passive stretching. Some guarding with both single and double K2C.    PT Frequency  2x / week    PT Duration  8 weeks    PT Treatment/Interventions  ADLs/Self Care Home Management;Cryotherapy;Electrical Stimulation;Moist Heat;Therapeutic exercise;Therapeutic activities;Neuromuscular re-education;Manual techniques;Patient/family education;Dry needling    PT Next Visit Plan  add core stab ex; continue to work on flexibility, lumbar mobs for muscle relaxation       Patient will benefit from skilled therapeutic intervention in order to improve the following deficits and impairments:  Decreased range of motion, Increased muscle spasms, Pain, Impaired flexibility, Improper body mechanics, Decreased strength, Decreased mobility  Visit Diagnosis: Acute bilateral low back pain without sciatica  Muscle weakness (generalized)     Problem List Patient Active Problem List   Diagnosis Date Noted  . Hypermobility syndrome 01/03/2017  . Nonallopathic lesion of thoracic region 01/03/2017  . Nonallopathic lesion of lumbosacral region 01/03/2017  . Nonallopathic lesion of sacral region 01/03/2017  . Acute bilateral low back pain without sciatica 09/11/2016  . Routine general medical examination at a health care facility 01/11/2016  . GERD (gastroesophageal reflux disease) 03/09/2014    Scot Jun, PTA 05/20/2018, 4:08 PM  [place], [place], [address] Phone: [phone]   Fax:  [phone]  Name: Ebony Allen MRN: [id number] Date of Birth: March 22, 1971

## 2018-05-23 ENCOUNTER — Encounter: Payer: 59 | Admitting: Physical Therapy

## 2018-05-29 ENCOUNTER — Ambulatory Visit: Payer: 59 | Attending: Internal Medicine | Admitting: Physical Therapy

## 2018-05-29 ENCOUNTER — Encounter: Payer: Self-pay | Admitting: Physical Therapy

## 2018-05-29 DIAGNOSIS — M6281 Muscle weakness (generalized): Secondary | ICD-10-CM

## 2018-05-29 DIAGNOSIS — M62830 Muscle spasm of back: Secondary | ICD-10-CM

## 2018-05-29 DIAGNOSIS — M545 Low back pain, unspecified: Secondary | ICD-10-CM

## 2018-05-29 DIAGNOSIS — G8929 Other chronic pain: Secondary | ICD-10-CM | POA: Diagnosis present

## 2018-05-29 DIAGNOSIS — M542 Cervicalgia: Secondary | ICD-10-CM

## 2018-05-29 NOTE — Patient Instructions (Signed)

## 2018-05-29 NOTE — Therapy (Signed)
[place], [place], [address] Phone: [phone]   Fax:  [phone]  Physical Therapy Treatment  Patient Details  Name: Ebony Allen MRN: [id number] Date of Birth: 03-07-1971 Referring Provider: Sharlet Salina   Encounter Date: 05/29/2018  PT End of Session - 05/29/18 1355    Visit Number  6    Date for PT Re-Evaluation  06/26/18    PT Start Time  1304    PT Stop Time  1411    PT Time Calculation (min)  67 min    Activity Tolerance  Patient tolerated treatment well    Behavior During Therapy  [facility] for tasks assessed/performed       Past Medical History:  Diagnosis Date  . Anemia   . GERD (gastroesophageal reflux disease)     Past Surgical History:  Procedure Laterality Date  . DILATATION & CURETTAGE/HYSTEROSCOPY WITH MYOSURE N/A 11/17/2014   Procedure: DILATATION & CURETTAGE/HYSTEROSCOPY WITH MYOSURE;  Surgeon: Delsa Bern, MD;  Location: [facility];  Service: Gynecology;  Laterality: N/A;  . DILITATION & CURRETTAGE/HYSTROSCOPY WITH VERSAPOINT RESECTION N/A 05/20/2015   Procedure: DILATATION & CURETTAGE/HYSTEROSCOPY WITH VERSAPOINT RESECTION;  Surgeon: Delsa Bern, MD;  Location: [facility];  Service: Gynecology;  Laterality: N/A;  . esophageal dilation     Dr Ronalee Red  . ROBOT ASSISTED MYOMECTOMY N/A 11/17/2014   Procedure: ROBOTIC ASSISTED MYOMECTOMY;  Surgeon: Delsa Bern, MD;  Location: [facility];  Service: Gynecology;  Laterality: N/A;    There were no vitals filed for this visit.  Subjective Assessment - 05/29/18 1305    Subjective  Pt reports that she had some back pain over the weekend. Saturday she reportes working around the house and her back started to give her some trouble. Pain continues for the next two days.     Currently in Pain?  Yes    Pain Score  6     Pain Location  Back                       [facility] Adult PT Treatment/Exercise - 05/29/18 0001      Neck Exercises:  Machines for Strengthening   UBE (Upper Arm Bike)  L 3 2 fwd/2 back      Lumbar Exercises: Aerobic   Nustep  L 4 5 min      Manual Therapy   Manual Therapy  Passive ROM;Soft tissue mobilization    Manual therapy comments  increase in LBP with all movement    Soft tissue mobilization  pain and tenderness in lumbar paraspinales    Passive ROM  LE       Trigger Point Dry Needling - 05/29/18 1449    Consent Given?  Yes    Education Handout Provided  Yes    Muscles Treated Upper Body  Longissimus    Longissimus Response  Palpable increased muscle length             PT Short Term Goals - 05/20/18 1607      PT SHORT TERM GOAL #1   Title  Pt will require minimal cuing and be able to verbalize HEP         PT Long Term Goals - 05/20/18 1607      PT LONG TERM GOAL #1   Title  pt will be independent with HEP       PT LONG TERM GOAL #2   Title  Pt will be able to  work full shift and perform all work related activites with 0/10 pain)    Status  On-going            Plan - 05/29/18 1338    Clinical Impression Statement  Pt enters clininc reporting increase low back pain from the weekend that started saturday and lasted a 2 days. Perfromed various motion to LE's such as K2C, SLR, and trunk rotations all motions causes increase low back pain. Pt then positioned herself in prone, when  attempting prone on elbows she reports increase pain.  Spoke with pt about DN and gave her a handout.     Rehab Potential  Good    PT Frequency  2x / week    PT Duration  8 weeks    PT Treatment/Interventions  ADLs/Self Care Home Management;Cryotherapy;Electrical Stimulation;Moist Heat;Therapeutic exercise;Therapeutic activities;Neuromuscular re-education;Manual techniques;Patient/family education;Dry needling    PT Next Visit Plan  assess DN        Patient will benefit from skilled therapeutic intervention in order to improve the following deficits and impairments:  Decreased range of  motion, Increased muscle spasms, Pain, Impaired flexibility, Improper body mechanics, Decreased strength, Decreased mobility  Visit Diagnosis: Acute bilateral low back pain without sciatica  Muscle weakness (generalized)  Muscle spasm of back  Cervicalgia  Chronic left-sided low back pain without sciatica     Problem List Patient Active Problem List   Diagnosis Date Noted  . Hypermobility syndrome 01/03/2017  . Nonallopathic lesion of thoracic region 01/03/2017  . Nonallopathic lesion of lumbosacral region 01/03/2017  . Nonallopathic lesion of sacral region 01/03/2017  . Acute bilateral low back pain without sciatica 09/11/2016  . Routine general medical examination at a health care facility 01/11/2016  . GERD (gastroesophageal reflux disease) 03/09/2014    Sumner Boast., PT 05/29/2018, 2:50 PM  [place], [place], [address] Phone: [phone]   Fax:  [phone]  Name: Ebony Allen MRN: [id number] Date of Birth: 1970/11/05

## 2018-06-03 ENCOUNTER — Telehealth: Payer: Self-pay | Admitting: Internal Medicine

## 2018-06-03 NOTE — Telephone Encounter (Signed)
Fine to fill out.
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
## 2018-06-03 NOTE — Telephone Encounter (Signed)
Forms have been completed & placed in providers box to review and sign.  °

## 2018-06-04 ENCOUNTER — Ambulatory Visit: Payer: 59 | Admitting: Physical Therapy

## 2018-06-04 ENCOUNTER — Encounter: Payer: Self-pay | Admitting: Physical Therapy

## 2018-06-04 DIAGNOSIS — M62830 Muscle spasm of back: Secondary | ICD-10-CM

## 2018-06-04 DIAGNOSIS — M545 Low back pain, unspecified: Secondary | ICD-10-CM

## 2018-06-04 DIAGNOSIS — M6281 Muscle weakness (generalized): Secondary | ICD-10-CM

## 2018-06-04 DIAGNOSIS — Z0279 Encounter for issue of other medical certificate: Secondary | ICD-10-CM

## 2018-06-04 NOTE — Telephone Encounter (Signed)
Ebony Allen from [facility] received FMLA forms needs to speak with someone regarding them CB # [phone]

## 2018-06-04 NOTE — Telephone Encounter (Signed)
Spoke with Ebony Allen , some of the pages in the fax did not come all the way threw, forms have been re faxed.

## 2018-06-04 NOTE — Telephone Encounter (Signed)
Forms have also been sent to scan, patient has been informed the original is ready to be picked up and charged for.

## 2018-06-04 NOTE — Therapy (Signed)
[place], [place], [address] Phone: [phone]   Fax:  [phone]  Physical Therapy Treatment  Patient Details  Name: Ebony Allen MRN: [id number] Date of Birth: Feb 22, 1971 Referring Provider: Sharlet Salina   Encounter Date: 06/04/2018  PT End of Session - 06/04/18 1652    Visit Number  7    Date for PT Re-Evaluation  06/26/18    PT Start Time  1608    PT Stop Time  1706    PT Time Calculation (min)  58 min    Activity Tolerance  Patient tolerated treatment well    Behavior During Therapy  [facility] for tasks assessed/performed       Past Medical History:  Diagnosis Date  . Anemia   . GERD (gastroesophageal reflux disease)     Past Surgical History:  Procedure Laterality Date  . DILATATION & CURETTAGE/HYSTEROSCOPY WITH MYOSURE N/A 11/17/2014   Procedure: DILATATION & CURETTAGE/HYSTEROSCOPY WITH MYOSURE;  Surgeon: Delsa Bern, MD;  Location: [facility];  Service: Gynecology;  Laterality: N/A;  . DILITATION & CURRETTAGE/HYSTROSCOPY WITH VERSAPOINT RESECTION N/A 05/20/2015   Procedure: DILATATION & CURETTAGE/HYSTEROSCOPY WITH VERSAPOINT RESECTION;  Surgeon: Delsa Bern, MD;  Location: [facility];  Service: Gynecology;  Laterality: N/A;  . esophageal dilation     Dr Ronalee Red  . ROBOT ASSISTED MYOMECTOMY N/A 11/17/2014   Procedure: ROBOTIC ASSISTED MYOMECTOMY;  Surgeon: Delsa Bern, MD;  Location: [facility];  Service: Gynecology;  Laterality: N/A;    There were no vitals filed for this visit.  Subjective Assessment - 06/04/18 1609    Subjective  Pt reports that she is feeling better but she still has a pinch of pain in her lower back.    Currently in Pain?  Yes    Pain Score  5     Pain Location  Back                       [facility] Adult PT Treatment/Exercise - 06/04/18 0001      Exercises   Exercises  Lumbar      Lumbar Exercises: Stretches   Passive Hamstring Stretch  Left;Right;4  reps;10 seconds    Single Knee to Chest Stretch  Left;Right;3 reps;10 seconds    Double Knee to Chest Stretch  3 reps;10 seconds    Piriformis Stretch  Right;Left;3 reps;10 seconds    Other Lumbar Stretch Exercise  Lower trunk rotations 4 x10''      Lumbar Exercises: Aerobic   Nustep  L 4 6 min      Lumbar Exercises: Supine   Bridge  Compliant;2 seconds;10 reps      Modalities   Modalities  Electrical Stimulation;Moist Heat      Moist Heat Therapy   Number Minutes Moist Heat  15 Minutes    Moist Heat Location  Lumbar Spine      Electrical Stimulation   Electrical Stimulation Location  lumbar spine    Electrical Stimulation Action  IFC    Electrical Stimulation Parameters  supine    Electrical Stimulation Goals  Pain      Manual Therapy   Manual Therapy  Passive ROM;Soft tissue mobilization    Soft tissue mobilization  pain and tenderness in lumbar paraspinales    Passive ROM  LE       Trigger Point Dry Needling - 06/04/18 1638    Consent Given?  Yes    Muscles Treated Upper Body  Longissimus;Quadratus Lumborum  Longissimus Response  Palpable increased muscle length             PT Short Term Goals - 05/20/18 1607      PT SHORT TERM GOAL #1   Title  Pt will require minimal cuing and be able to verbalize HEP         PT Long Term Goals - 05/20/18 1607      PT LONG TERM GOAL #1   Title  pt will be independent with HEP       PT LONG TERM GOAL #2   Title  Pt will be able to work full shift and perform all work related activites with 0/10 pain)    Status  On-going            Plan - 06/04/18 1654    Clinical Impression Statement  Pt with a better overall tolerance to MT and activity. Pt does report a small pinch of pain in her low back. She was able to tolerate some passive stretching of the LE with a little pain at end range. Pain with single and double K2C, pain with piriformis stretch. Pt has tight bilat piriformis noted with stretching.    Rehab  Potential  Good    PT Frequency  2x / week    PT Duration  8 weeks    PT Next Visit Plan  assess DN continues with lumbar stretching       Patient will benefit from skilled therapeutic intervention in order to improve the following deficits and impairments:  Decreased range of motion, Increased muscle spasms, Pain, Impaired flexibility, Improper body mechanics, Decreased strength, Decreased mobility  Visit Diagnosis: Acute bilateral low back pain without sciatica  Muscle weakness (generalized)  Muscle spasm of back     Problem List Patient Active Problem List   Diagnosis Date Noted  . Hypermobility syndrome 01/03/2017  . Nonallopathic lesion of thoracic region 01/03/2017  . Nonallopathic lesion of lumbosacral region 01/03/2017  . Nonallopathic lesion of sacral region 01/03/2017  . Acute bilateral low back pain without sciatica 09/11/2016  . Routine general medical examination at a health care facility 01/11/2016  . GERD (gastroesophageal reflux disease) 03/09/2014    Scot Jun, PTA 06/04/2018, 4:57 PM  [place], [place], [address] Phone: [phone]   Fax:  [phone]  Name: Rozina Pointer MRN: [id number] Date of Birth: 1971/04/12

## 2018-06-06 ENCOUNTER — Encounter: Payer: Self-pay | Admitting: Physical Therapy

## 2018-06-06 ENCOUNTER — Ambulatory Visit: Payer: 59 | Admitting: Physical Therapy

## 2018-06-06 DIAGNOSIS — M62830 Muscle spasm of back: Secondary | ICD-10-CM

## 2018-06-06 DIAGNOSIS — M545 Low back pain, unspecified: Secondary | ICD-10-CM

## 2018-06-06 DIAGNOSIS — M6281 Muscle weakness (generalized): Secondary | ICD-10-CM

## 2018-06-06 NOTE — Telephone Encounter (Signed)
Fine to get assessment from PT to validate the leave.
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
## 2018-06-06 NOTE — Therapy (Signed)
[place], [place], [address] Phone: [phone]   Fax:  [phone]  Physical Therapy Treatment  Patient Details  Name: Ebony Allen MRN: [id number] Date of Birth: 1971-03-28 Referring Provider: Sharlet Salina   Encounter Date: 06/06/2018  PT End of Session - 06/06/18 1642    Visit Number  8    Date for PT Re-Evaluation  06/26/18    PT Start Time  1600    PT Stop Time  1650    PT Time Calculation (min)  50 min    Activity Tolerance  Patient tolerated treatment well    Behavior During Therapy  [facility] for tasks assessed/performed       Past Medical History:  Diagnosis Date  . Anemia   . GERD (gastroesophageal reflux disease)     Past Surgical History:  Procedure Laterality Date  . DILATATION & CURETTAGE/HYSTEROSCOPY WITH MYOSURE N/A 11/17/2014   Procedure: DILATATION & CURETTAGE/HYSTEROSCOPY WITH MYOSURE;  Surgeon: Delsa Bern, MD;  Location: [facility];  Service: Gynecology;  Laterality: N/A;  . DILITATION & CURRETTAGE/HYSTROSCOPY WITH VERSAPOINT RESECTION N/A 05/20/2015   Procedure: DILATATION & CURETTAGE/HYSTEROSCOPY WITH VERSAPOINT RESECTION;  Surgeon: Delsa Bern, MD;  Location: [facility];  Service: Gynecology;  Laterality: N/A;  . esophageal dilation     Dr Ronalee Red  . ROBOT ASSISTED MYOMECTOMY N/A 11/17/2014   Procedure: ROBOTIC ASSISTED MYOMECTOMY;  Surgeon: Delsa Bern, MD;  Location: [facility];  Service: Gynecology;  Laterality: N/A;    There were no vitals filed for this visit.  Subjective Assessment - 06/06/18 1603    Subjective  Pt reports that the pinch is not as bad today, just a little sore in that area.    Currently in Pain?  Yes    Pain Score  3     Pain Location  Back    Pain Descriptors / Indicators  Sore                       [facility] Adult PT Treatment/Exercise - 06/06/18 0001      Exercises   Exercises  Lumbar      Lumbar Exercises: Stretches   Passive  Hamstring Stretch  Left;Right;4 reps;10 seconds    Single Knee to Chest Stretch  Left;Right;3 reps;10 seconds    Double Knee to Chest Stretch  3 reps;10 seconds    Piriformis Stretch  Right;Left;3 reps;10 seconds    Other Lumbar Stretch Exercise  Lower trunk rotations 4 x10''      Lumbar Exercises: Aerobic   Nustep  L 5 6 min      Lumbar Exercises: Standing   Other Standing Lumbar Exercises  2lb hip extensions x10      Modalities   Modalities  Electrical Stimulation;Moist Heat      Moist Heat Therapy   Number Minutes Moist Heat  15 Minutes    Moist Heat Location  Lumbar Spine      Electrical Stimulation   Electrical Stimulation Location  lumbar spine    Electrical Stimulation Action  IFC    Electrical Stimulation Parameters  supine    Electrical Stimulation Goals  Pain      Manual Therapy   Passive ROM  LE               PT Short Term Goals - 05/20/18 1607      PT SHORT TERM GOAL #1   Title  Pt will require minimal cuing and be  able to verbalize HEP         PT Long Term Goals - 05/20/18 1607      PT LONG TERM GOAL #1   Title  pt will be independent with HEP       PT LONG TERM GOAL #2   Title  Pt will be able to work full shift and perform all work related activites with 0/10 pain)    Status  On-going            Plan - 06/06/18 1643    Clinical Impression Statement  Pt enters clinic reporting less pinching in her lower back, but some soreness remains. She did well with seated lats and rows, but did say she could feel her back with hip extensions. Tightness notes with single K2C, increase tightness in her low back reported with piriformis stretch. This seem to ease off a little after a few reps.     Rehab Potential  Good    PT Frequency  2x / week    PT Treatment/Interventions  ADLs/Self Care Home Management;Cryotherapy;Electrical Stimulation;Moist Heat;Therapeutic exercise;Therapeutic activities;Neuromuscular re-education;Manual  techniques;Patient/family education;Dry needling    PT Next Visit Plan  core strengthening anf lumbar ROM       Patient will benefit from skilled therapeutic intervention in order to improve the following deficits and impairments:  Decreased range of motion, Increased muscle spasms, Pain, Impaired flexibility, Improper body mechanics, Decreased strength, Decreased mobility  Visit Diagnosis: Acute bilateral low back pain without sciatica  Muscle weakness (generalized)  Muscle spasm of back     Problem List Patient Active Problem List   Diagnosis Date Noted  . Hypermobility syndrome 01/03/2017  . Nonallopathic lesion of thoracic region 01/03/2017  . Nonallopathic lesion of lumbosacral region 01/03/2017  . Nonallopathic lesion of sacral region 01/03/2017  . Acute bilateral low back pain without sciatica 09/11/2016  . Routine general medical examination at a health care facility 01/11/2016  . GERD (gastroesophageal reflux disease) 03/09/2014    Scot Jun, PTA 06/06/2018, 4:46 PM  [place], [place], [address] Phone: [phone]   Fax:  [phone]  Name: Kela Baccari MRN: [id number] Date of Birth: 05-13-1971

## 2018-06-07 NOTE — Telephone Encounter (Signed)
LVM for patient yesterday to inform her of the notes below, She returned my phone call after I left for the day.  Called again today & LVM for her to return my call.

## 2018-06-10 ENCOUNTER — Ambulatory Visit: Payer: 59 | Admitting: Physical Therapy

## 2018-06-10 ENCOUNTER — Encounter: Payer: Self-pay | Admitting: Physical Therapy

## 2018-06-10 DIAGNOSIS — M6281 Muscle weakness (generalized): Secondary | ICD-10-CM

## 2018-06-10 DIAGNOSIS — M545 Low back pain, unspecified: Secondary | ICD-10-CM

## 2018-06-10 DIAGNOSIS — M542 Cervicalgia: Secondary | ICD-10-CM

## 2018-06-10 DIAGNOSIS — M62830 Muscle spasm of back: Secondary | ICD-10-CM
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
## 2018-06-12 ENCOUNTER — Encounter: Payer: Self-pay | Admitting: Physical Therapy

## 2018-06-12 ENCOUNTER — Ambulatory Visit: Payer: 59 | Admitting: Physical Therapy

## 2018-06-12 DIAGNOSIS — M545 Low back pain, unspecified: Secondary | ICD-10-CM

## 2018-06-12 DIAGNOSIS — M62830 Muscle spasm of back: Secondary | ICD-10-CM

## 2018-06-12 DIAGNOSIS — M6281 Muscle weakness (generalized): Secondary | ICD-10-CM

## 2018-06-12 NOTE — Therapy (Signed)
[place], [place], [address] Phone: [phone]   Fax:  [phone]  Physical Therapy Treatment  Patient Details  Name: Ebony Allen MRN: [id number] Date of Birth: December 17, 1970 Referring Provider: Sharlet Salina   Encounter Date: 06/12/2018  PT End of Session - 06/12/18 1646    Visit Number  10    Date for PT Re-Evaluation  06/26/18    PT Start Time  1600    PT Stop Time  1700    PT Time Calculation (min)  60 min    Activity Tolerance  Patient tolerated treatment well    Behavior During Therapy  [facility] for tasks assessed/performed       Past Medical History:  Diagnosis Date  . Anemia   . GERD (gastroesophageal reflux disease)     Past Surgical History:  Procedure Laterality Date  . DILATATION & CURETTAGE/HYSTEROSCOPY WITH MYOSURE N/A 11/17/2014   Procedure: DILATATION & CURETTAGE/HYSTEROSCOPY WITH MYOSURE;  Surgeon: Delsa Bern, MD;  Location: [facility];  Service: Gynecology;  Laterality: N/A;  . DILITATION & CURRETTAGE/HYSTROSCOPY WITH VERSAPOINT RESECTION N/A 05/20/2015   Procedure: DILATATION & CURETTAGE/HYSTEROSCOPY WITH VERSAPOINT RESECTION;  Surgeon: Delsa Bern, MD;  Location: [facility];  Service: Gynecology;  Laterality: N/A;  . esophageal dilation     Dr Ronalee Red  . ROBOT ASSISTED MYOMECTOMY N/A 11/17/2014   Procedure: ROBOTIC ASSISTED MYOMECTOMY;  Surgeon: Delsa Bern, MD;  Location: [facility];  Service: Gynecology;  Laterality: N/A;    There were no vitals filed for this visit.  Subjective Assessment - 06/12/18 1606    Subjective  "I am doing better" No pain when turning head, only a little pain turning her head    Currently in Pain?  Yes    Pain Score  4     Pain Location  Back    Pain Orientation  Lower                       [facility] Adult PT Treatment/Exercise - 06/12/18 0001      Exercises   Exercises  Lumbar      Lumbar Exercises: Stretches   Passive Hamstring  Stretch  Left;Right;4 reps;10 seconds    Single Knee to Chest Stretch  Left;Right;10 seconds;4 reps    Double Knee to Chest Stretch  3 reps;10 seconds    Other Lumbar Stretch Exercise  Lower trunk rotations 4 x10''      Lumbar Exercises: Aerobic   Nustep  L 5 6 min      Lumbar Exercises: Machines for Strengthening   Cybex Knee Extension  5lb 2x10     Cybex Knee Flexion  20lb 2x10     Other Lumbar Machine Exercise  Rows and lats 20lb 2x10       Lumbar Exercises: Standing   Other Standing Lumbar Exercises  Ar press 15lb x10       Lumbar Exercises: Supine   Bridge  Compliant;2 seconds;20 reps      Modalities   Modalities  Electrical Stimulation;Moist Heat      Moist Heat Therapy   Number Minutes Moist Heat  15 Minutes    Moist Heat Location  Lumbar Spine      Electrical Stimulation   Electrical Stimulation Location  lumbar spine    Electrical Stimulation Action  IFC    Electrical Stimulation Parameters  supine    Electrical Stimulation Goals  Pain      Manual Therapy  Soft tissue mobilization  posterior lumbar                PT Short Term Goals - 05/20/18 1607      PT SHORT TERM GOAL #1   Title  Pt will require minimal cuing and be able to verbalize HEP         PT Long Term Goals - 05/20/18 1607      PT LONG TERM GOAL #1   Title  pt will be independent with HEP       PT LONG TERM GOAL #2   Title  Pt will be able to work full shift and perform all work related activites with 0/10 pain)    Status  On-going            Plan - 06/12/18 1649    Clinical Impression Statement  Pt reports that she feels better overall. She did well with all strengthening exercises. Improved mobility to cervical spine. Positive response to MT reporting decrease pain and tightness.     Rehab Potential  Good    PT Frequency  2x / week    PT Duration  8 weeks    PT Treatment/Interventions  ADLs/Self Care Home Management;Cryotherapy;Electrical Stimulation;Moist  Heat;Therapeutic exercise;Therapeutic activities;Neuromuscular re-education;Manual techniques;Patient/family education;Dry needling    PT Next Visit Plan  core strengthening and lumbar ROM       Patient will benefit from skilled therapeutic intervention in order to improve the following deficits and impairments:  Decreased range of motion, Increased muscle spasms, Pain, Impaired flexibility, Improper body mechanics, Decreased strength, Decreased mobility  Visit Diagnosis: Acute bilateral low back pain without sciatica  Muscle weakness (generalized)  Muscle spasm of back     Problem List Patient Active Problem List   Diagnosis Date Noted  . Hypermobility syndrome 01/03/2017  . Nonallopathic lesion of thoracic region 01/03/2017  . Nonallopathic lesion of lumbosacral region 01/03/2017  . Nonallopathic lesion of sacral region 01/03/2017  . Acute bilateral low back pain without sciatica 09/11/2016  . Routine general medical examination at a health care facility 01/11/2016  . GERD (gastroesophageal reflux disease) 03/09/2014    Scot Jun, PTA 06/12/2018, 4:51 PM  [place], [place], [address] Phone: [phone]   Fax:  [phone]  Name: Ebony Allen MRN: [id number] Date of Birth: Oct 21, 1970

## 2018-06-17 ENCOUNTER — Ambulatory Visit: Payer: 59 | Admitting: Physical Therapy

## 2018-06-17 ENCOUNTER — Encounter: Payer: Self-pay | Admitting: Physical Therapy

## 2018-06-17 DIAGNOSIS — M545 Low back pain, unspecified: Secondary | ICD-10-CM

## 2018-06-17 DIAGNOSIS — M62830 Muscle spasm of back: Secondary | ICD-10-CM

## 2018-06-17 DIAGNOSIS — M6281 Muscle weakness (generalized): Secondary | ICD-10-CM

## 2018-06-17 NOTE — Therapy (Signed)
[place], [place], [address] Phone: [phone]   Fax:  [phone]  Physical Therapy Treatment  Patient Details  Name: Ebony Allen MRN: [id number] Date of Birth: 1970-12-04 Referring Provider: Sharlet Salina   Encounter Date: 06/17/2018  PT End of Session - 06/17/18 1648    Visit Number  11    Date for PT Re-Evaluation  06/26/18    PT Start Time  1604    PT Stop Time  1701    PT Time Calculation (min)  57 min    Activity Tolerance  Patient tolerated treatment well    Behavior During Therapy  [facility] for tasks assessed/performed       Past Medical History:  Diagnosis Date  . Anemia   . GERD (gastroesophageal reflux disease)     Past Surgical History:  Procedure Laterality Date  . DILATATION & CURETTAGE/HYSTEROSCOPY WITH MYOSURE N/A 11/17/2014   Procedure: DILATATION & CURETTAGE/HYSTEROSCOPY WITH MYOSURE;  Surgeon: Delsa Bern, MD;  Location: [facility];  Service: Gynecology;  Laterality: N/A;  . DILITATION & CURRETTAGE/HYSTROSCOPY WITH VERSAPOINT RESECTION N/A 05/20/2015   Procedure: DILATATION & CURETTAGE/HYSTEROSCOPY WITH VERSAPOINT RESECTION;  Surgeon: Delsa Bern, MD;  Location: [facility];  Service: Gynecology;  Laterality: N/A;  . esophageal dilation     Dr Ronalee Red  . ROBOT ASSISTED MYOMECTOMY N/A 11/17/2014   Procedure: ROBOTIC ASSISTED MYOMECTOMY;  Surgeon: Delsa Bern, MD;  Location: [facility];  Service: Gynecology;  Laterality: N/A;    There were no vitals filed for this visit.  Subjective Assessment - 06/17/18 1613    Subjective  "I am feeling pretty good" "Still a little sore in that one area"    Currently in Pain?  Yes    Pain Score  4     Pain Location  Back                       [facility] Adult PT Treatment/Exercise - 06/17/18 0001      Exercises   Exercises  Lumbar      Lumbar Exercises: Stretches   Passive Hamstring Stretch  Left;Right;4 reps;10 seconds    Double  Knee to Chest Stretch  5 reps;10 seconds    Other Lumbar Stretch Exercise  Lower trunk rotations 4 x10''    Other Lumbar Stretch Exercise  Manual lumbar traction      Lumbar Exercises: Aerobic   Nustep  L 5 6 min      Lumbar Exercises: Machines for Strengthening   Other Lumbar Machine Exercise  Rows and lats 20lb 2x10       Lumbar Exercises: Standing   Other Standing Lumbar Exercises  Standing overhead ext, trunk rotations yellow ball 2x10     Other Standing Lumbar Exercises  hip abd/ext red Tband 2x10       Modalities   Modalities  Electrical Stimulation;Moist Heat      Moist Heat Therapy   Number Minutes Moist Heat  15 Minutes    Moist Heat Location  Lumbar Spine      Electrical Stimulation   Electrical Stimulation Location  lumbar spine    Electrical Stimulation Action  IFC    Electrical Stimulation Parameters  supine    Electrical Stimulation Goals  Pain               PT Short Term Goals - 05/20/18 1607      PT SHORT TERM GOAL #1   Title  Pt  will require minimal cuing and be able to verbalize HEP         PT Long Term Goals - 05/20/18 1607      PT LONG TERM GOAL #1   Title  pt will be independent with HEP       PT LONG TERM GOAL #2   Title  Pt will be able to work full shift and perform all work related activites with 0/10 pain)    Status  On-going            Plan - 06/17/18 1649    Clinical Impression Statement  Pt continues to do well with strengthening exercises. Report some soreness in low back but no pain. She did report some discomfort with standing hip extension. Good mobility with standing trunk rotations.    Rehab Potential  Good    PT Frequency  2x / week    PT Duration  8 weeks    PT Treatment/Interventions  ADLs/Self Care Home Management;Cryotherapy;Electrical Stimulation;Moist Heat;Therapeutic exercise;Therapeutic activities;Neuromuscular re-education;Manual techniques;Patient/family education;Dry needling    PT Next Visit Plan  core  strengthening and lumbar ROM       Patient will benefit from skilled therapeutic intervention in order to improve the following deficits and impairments:  Decreased range of motion, Increased muscle spasms, Pain, Impaired flexibility, Improper body mechanics, Decreased strength, Decreased mobility  Visit Diagnosis: Acute bilateral low back pain without sciatica  Muscle weakness (generalized)  Muscle spasm of back     Problem List Patient Active Problem List   Diagnosis Date Noted  . Hypermobility syndrome 01/03/2017  . Nonallopathic lesion of thoracic region 01/03/2017  . Nonallopathic lesion of lumbosacral region 01/03/2017  . Nonallopathic lesion of sacral region 01/03/2017  . Acute bilateral low back pain without sciatica 09/11/2016  . Routine general medical examination at a health care facility 01/11/2016  . GERD (gastroesophageal reflux disease) 03/09/2014    Scot Jun, PTA 06/17/2018, 4:54 PM  [place], [place], [address] Phone: [phone]   Fax:  [phone]  Name: Ebony Allen MRN: [id number] Date of Birth: Aug 22, 1971

## 2018-06-19 ENCOUNTER — Encounter: Payer: Self-pay | Admitting: Physical Therapy

## 2018-06-19 ENCOUNTER — Ambulatory Visit: Payer: 59 | Admitting: Physical Therapy

## 2018-06-19 ENCOUNTER — Encounter: Payer: 59 | Admitting: Physical Therapy

## 2018-06-19 DIAGNOSIS — M545 Low back pain, unspecified: Secondary | ICD-10-CM

## 2018-06-19 DIAGNOSIS — M6281 Muscle weakness (generalized): Secondary | ICD-10-CM

## 2018-06-19 NOTE — Telephone Encounter (Signed)
Spoke with patient, She just wanted to see if I had heard anything from her HR office - I informed her I had not. She also wanted to inform she did take the leave from work and seems to be doing much better.

## 2018-06-19 NOTE — Telephone Encounter (Signed)
Pt returning Brittany's call.  Please call pt: [phone]

## 2018-06-19 NOTE — Therapy (Signed)
[place], [place], [address] Phone: [phone]   Fax:  [phone]  Physical Therapy Treatment  Patient Details  Name: Ebony Allen MRN: [id number] Date of Birth: 1971-06-15 Referring Provider: Sharlet Salina   Encounter Date: 06/19/2018  PT End of Session - 06/19/18 0842    Visit Number  12    Date for PT Re-Evaluation  06/26/18    PT Start Time  0800    PT Stop Time  0855    PT Time Calculation (min)  55 min    Activity Tolerance  Patient tolerated treatment well    Behavior During Therapy  [facility] for tasks assessed/performed       Past Medical History:  Diagnosis Date  . Anemia   . GERD (gastroesophageal reflux disease)     Past Surgical History:  Procedure Laterality Date  . DILATATION & CURETTAGE/HYSTEROSCOPY WITH MYOSURE N/A 11/17/2014   Procedure: DILATATION & CURETTAGE/HYSTEROSCOPY WITH MYOSURE;  Surgeon: Delsa Bern, MD;  Location: [facility];  Service: Gynecology;  Laterality: N/A;  . DILITATION & CURRETTAGE/HYSTROSCOPY WITH VERSAPOINT RESECTION N/A 05/20/2015   Procedure: DILATATION & CURETTAGE/HYSTEROSCOPY WITH VERSAPOINT RESECTION;  Surgeon: Delsa Bern, MD;  Location: [facility];  Service: Gynecology;  Laterality: N/A;  . esophageal dilation     Dr Ronalee Red  . ROBOT ASSISTED MYOMECTOMY N/A 11/17/2014   Procedure: ROBOTIC ASSISTED MYOMECTOMY;  Surgeon: Delsa Bern, MD;  Location: [facility];  Service: Gynecology;  Laterality: N/A;    There were no vitals filed for this visit.  Subjective Assessment - 06/19/18 0810    Subjective  "I am feeling pretty all right"    Currently in Pain?  Yes    Pain Score  3     Pain Location  Back    Pain Descriptors / Indicators  Aching;Sore;Tightness                       [facility] Adult PT Treatment/Exercise - 06/19/18 0001      Exercises   Exercises  Lumbar      Lumbar Exercises: Aerobic   Elliptical  I8 R4 57mn each     UBE (Upper Arm  Bike)  L4 2 min each way       Lumbar Exercises: Machines for Strengthening   Cybex Knee Extension  10lb 2x10     Cybex Knee Flexion  25lb 2x10     Other Lumbar Machine Exercise  Rows and lats 25lb 2x10       Lumbar Exercises: Standing   Other Standing Lumbar Exercises  Ar press 20lb x10       Lumbar Exercises: Seated   Sit to Stand  20 reps   forward press with yellow ball    Other Seated Lumbar Exercises  ab sets with Pball 2x10       Lumbar Exercises: Supine   Other Supine Lumbar Exercises  LE on Pball bridges, HS culs,, Oblq      Modalities   Modalities  Electrical Stimulation;Moist Heat      Moist Heat Therapy   Number Minutes Moist Heat  15 Minutes    Moist Heat Location  Lumbar Spine      Electrical Stimulation   Electrical Stimulation Location  lumbar spine    Electrical Stimulation Action  IFC    Electrical Stimulation Parameters  supine    Electrical Stimulation Goals  Pain  PT Short Term Goals - 05/20/18 1607      PT SHORT TERM GOAL #1   Title  Pt will require minimal cuing and be able to verbalize HEP         PT Long Term Goals - 06/19/18 0810      PT LONG TERM GOAL #2   Title  Pt will be able to work full shift and perform all work related activites with 0/10 pain)    Status  Partially Met      PT LONG TERM GOAL #3   Title  pt will be able to return to working out 4 times a week with </= to 2/10 pain    Status  Partially Met      PT LONG TERM GOAL #4   Title  increase SLR to 60 degrees    Status  Achieved            Plan - 06/19/18 0843    Clinical Impression Statement  Pt again is doing well, with no set back. No issues with a progressed treatment session. Some postural cues needed with seated rows.  Cues to contract core with ab sets. She still reports some pain at work towards the end of the day    Rehab Potential  Good    PT Frequency  2x / week    PT Duration  8 weeks    PT Next Visit Plan  core strengthening  and lumbar ROM       Patient will benefit from skilled therapeutic intervention in order to improve the following deficits and impairments:  Decreased range of motion, Increased muscle spasms, Pain, Impaired flexibility, Improper body mechanics, Decreased strength, Decreased mobility  Visit Diagnosis: Acute bilateral low back pain without sciatica  Muscle weakness (generalized)     Problem List Patient Active Problem List   Diagnosis Date Noted  . Hypermobility syndrome 01/03/2017  . Nonallopathic lesion of thoracic region 01/03/2017  . Nonallopathic lesion of lumbosacral region 01/03/2017  . Nonallopathic lesion of sacral region 01/03/2017  . Acute bilateral low back pain without sciatica 09/11/2016  . Routine general medical examination at a health care facility 01/11/2016  . GERD (gastroesophageal reflux disease) 03/09/2014    Ebony Allen, Ebony Allen 06/19/2018, 8:46 AM  [place], [place], [address] Phone: [phone]   Fax:  [phone]  Name: Ebony Allen MRN: [id number] Date of Birth: 1970/10/02

## 2018-06-20 ENCOUNTER — Ambulatory Visit: Payer: 59 | Admitting: Nurse Practitioner

## 2018-06-21 ENCOUNTER — Ambulatory Visit: Payer: 59 | Admitting: Nurse Practitioner

## 2018-06-21 ENCOUNTER — Encounter: Payer: Self-pay | Admitting: Nurse Practitioner

## 2018-06-21 VITALS — BP 108/70 | HR 62 | Ht 63.0 in | Wt 136.0 lb

## 2018-06-21 DIAGNOSIS — M545 Low back pain: Secondary | ICD-10-CM

## 2018-06-21 DIAGNOSIS — G8929 Other chronic pain: Secondary | ICD-10-CM
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
## 2018-06-21 NOTE — Patient Instructions (Signed)
Please schedule a follow up appointment for further evaluation of back pain here with Dr Tamala Julian or Dr Raeford Razor, our sports medicine providers.   Back Exercises If you have pain in your back, do these exercises 2-3 times each day or as told by your doctor. When the pain goes away, do the exercises once each day, but repeat the steps more times for each exercise (do more repetitions). If you do not have pain in your back, do these exercises once each day or as told by your doctor. Exercises Single Knee to Chest  Do these steps 3-5 times in a row for each leg: 1. Lie on your back on a firm bed or the floor with your legs stretched out. 2. Bring one knee to your chest. 3. Hold your knee to your chest by grabbing your knee or thigh. 4. Pull on your knee until you feel a gentle stretch in your lower back. 5. Keep doing the stretch for 10-30 seconds. 6. Slowly let go of your leg and straighten it.  Pelvic Tilt  Do these steps 5-10 times in a row: 1. Lie on your back on a firm bed or the floor with your legs stretched out. 2. Bend your knees so they point up to the ceiling. Your feet should be flat on the floor. 3. Tighten your lower belly (abdomen) muscles to press your lower back against the floor. This will make your tailbone point up to the ceiling instead of pointing down to your feet or the floor. 4. Stay in this position for 5-10 seconds while you gently tighten your muscles and breathe evenly.  Cat-Cow  Do these steps until your lower back bends more easily: 1. Get on your hands and knees on a firm surface. Keep your hands under your shoulders, and keep your knees under your hips. You may put padding under your knees. 2. Let your head hang down, and make your tailbone point down to the floor so your lower back is round like the back of a cat. 3. Stay in this position for 5 seconds. 4. Slowly lift your head and make your tailbone point up to the ceiling so your back hangs low (sags) like  the back of a cow. 5. Stay in this position for 5 seconds.  Press-Ups  Do these steps 5-10 times in a row: 1. Lie on your belly (face-down) on the floor. 2. Place your hands near your head, about shoulder-width apart. 3. While you keep your back relaxed and keep your hips on the floor, slowly straighten your arms to raise the top half of your body and lift your shoulders. Do not use your back muscles. To make yourself more comfortable, you may change where you place your hands. 4. Stay in this position for 5 seconds. 5. Slowly return to lying flat on the floor.  Bridges  Do these steps 10 times in a row: 1. Lie on your back on a firm surface. 2. Bend your knees so they point up to the ceiling. Your feet should be flat on the floor. 3. Tighten your butt muscles and lift your butt off of the floor until your waist is almost as high as your knees. If you do not feel the muscles working in your butt and the back of your thighs, slide your feet 1-2 inches farther away from your butt. 4. Stay in this position for 3-5 seconds. 5. Slowly lower your butt to the floor, and let your butt muscles relax.  If this exercise is too easy, try doing it with your arms crossed over your chest. Belly Crunches  Do these steps 5-10 times in a row: 1. Lie on your back on a firm bed or the floor with your legs stretched out. 2. Bend your knees so they point up to the ceiling. Your feet should be flat on the floor. 3. Cross your arms over your chest. 4. Tip your chin a little bit toward your chest but do not bend your neck. 5. Tighten your belly muscles and slowly raise your chest just enough to lift your shoulder blades a tiny bit off of the floor. 6. Slowly lower your chest and your head to the floor.  Back Lifts Do these steps 5-10 times in a row: 1. Lie on your belly (face-down) with your arms at your sides, and rest your forehead on the floor. 2. Tighten the muscles in your legs and your  butt. 3. Slowly lift your chest off of the floor while you keep your hips on the floor. Keep the back of your head in line with the curve in your back. Look at the floor while you do this. 4. Stay in this position for 3-5 seconds. 5. Slowly lower your chest and your face to the floor.  Contact a doctor if:  Your back pain gets a lot worse when you do an exercise.  Your back pain does not lessen 2 hours after you exercise. If you have any of these problems, stop doing the exercises. Do not do them again unless your doctor says it is okay. Get help right away if:  You have sudden, very bad back pain. If this happens, stop doing the exercises. Do not do them again unless your doctor says it is okay. This information is not intended to replace advice given to you by your health care provider. Make sure you discuss any questions you have with your health care provider. Document Released: 10/14/2010 Document Revised: 02/17/2016 Document Reviewed: 11/05/2014 Elsevier Interactive Patient Education  [employer].

## 2018-06-24 ENCOUNTER — Encounter: Payer: Self-pay | Admitting: Physical Therapy

## 2018-06-24 ENCOUNTER — Ambulatory Visit: Payer: 59 | Admitting: Physical Therapy

## 2018-06-24 DIAGNOSIS — M6281 Muscle weakness (generalized): Secondary | ICD-10-CM

## 2018-06-24 DIAGNOSIS — M62830 Muscle spasm of back: Secondary | ICD-10-CM

## 2018-06-24 DIAGNOSIS — M545 Low back pain, unspecified: Secondary | ICD-10-CM

## 2018-06-24 NOTE — Therapy (Signed)
[place], [place], [address] Phone: [phone]   Fax:  [phone]  Physical Therapy Treatment  Patient Details  Name: Ebony Allen MRN: [id number] Date of Birth: 06-29-1971 Referring Provider (PT): Loney Loh Date: 06/24/2018  PT End of Session - 06/24/18 1653    Visit Number  13    Date for PT Re-Evaluation  06/26/18    PT Start Time  1601    PT Stop Time  1705    PT Time Calculation (min)  64 min    Activity Tolerance  Patient tolerated treatment well    Behavior During Therapy  [facility] for tasks assessed/performed       Past Medical History:  Diagnosis Date  . Anemia   . GERD (gastroesophageal reflux disease)     Past Surgical History:  Procedure Laterality Date  . DILATATION & CURETTAGE/HYSTEROSCOPY WITH MYOSURE N/A 11/17/2014   Procedure: DILATATION & CURETTAGE/HYSTEROSCOPY WITH MYOSURE;  Surgeon: Delsa Bern, MD;  Location: [facility];  Service: Gynecology;  Laterality: N/A;  . DILITATION & CURRETTAGE/HYSTROSCOPY WITH VERSAPOINT RESECTION N/A 05/20/2015   Procedure: DILATATION & CURETTAGE/HYSTEROSCOPY WITH VERSAPOINT RESECTION;  Surgeon: Delsa Bern, MD;  Location: [facility];  Service: Gynecology;  Laterality: N/A;  . esophageal dilation     Dr Ronalee Red  . ROBOT ASSISTED MYOMECTOMY N/A 11/17/2014   Procedure: ROBOTIC ASSISTED MYOMECTOMY;  Surgeon: Delsa Bern, MD;  Location: [facility];  Service: Gynecology;  Laterality: N/A;    There were no vitals filed for this visit.  Subjective Assessment - 06/24/18 1601    Subjective  "It is going pretty good, back is a little better, I am probably at a 4, I did a lot of walking this weekend"    Currently in Pain?  Yes    Pain Score  4     Pain Location  Back    Pain Orientation  Lower                       [facility] Adult PT Treatment/Exercise - 06/24/18 0001      Exercises   Exercises  Lumbar      Lumbar Exercises:  Stretches   Passive Hamstring Stretch  Left;Right;4 reps;10 seconds    Single Knee to Chest Stretch  Left;Right;10 seconds;4 reps    Double Knee to Chest Stretch  10 seconds;3 reps    Other Lumbar Stretch Exercise  Lower trunk rotations 4 x10''      Lumbar Exercises: Aerobic   Elliptical  I R 76min each     Nustep  L 5 6 min      Lumbar Exercises: Machines for Strengthening   Cybex Knee Extension  10lb 2x10     Cybex Knee Flexion  25lb 2x10     Other Lumbar Machine Exercise  Rows and lats 25lb 2x10       Lumbar Exercises: Standing   Other Standing Lumbar Exercises  Standing overhead ext, trunk rotations yellow ball 2x10     Other Standing Lumbar Exercises  Ar press 20lb x10       Lumbar Exercises: Seated   Sit to Stand  20 reps   holding yellow ball    Other Seated Lumbar Exercises  ab sets with Pball 2x10                PT Short Term Goals - 05/20/18 1607      PT SHORT TERM GOAL #  1   Title  Pt will require minimal cuing and be able to verbalize HEP         PT Long Term Goals - 06/24/18 1655      PT LONG TERM GOAL #1   Title  pt will be independent with HEP     Status  Achieved            Plan - 06/24/18 1654    Clinical Impression Statement  Pt tolerated a progressed therapy session well, she does reported an increase in LBP with seated ab sets and standing AR presses. Pt also reports some fatigue. Postural cues needed with seated rows and lat pull downs.     Rehab Potential  Good    PT Frequency  2x / week    PT Duration  8 weeks    PT Next Visit Plan  core strengthening and lumbar ROM       Patient will benefit from skilled therapeutic intervention in order to improve the following deficits and impairments:  Decreased range of motion, Increased muscle spasms, Pain, Impaired flexibility, Improper body mechanics, Decreased strength, Decreased mobility  Visit Diagnosis: Acute bilateral low back pain without sciatica  Muscle weakness  (generalized)  Muscle spasm of back     Problem List Patient Active Problem List   Diagnosis Date Noted  . Hypermobility syndrome 01/03/2017  . Nonallopathic lesion of thoracic region 01/03/2017  . Nonallopathic lesion of lumbosacral region 01/03/2017  . Nonallopathic lesion of sacral region 01/03/2017  . Acute bilateral low back pain without sciatica 09/11/2016  . Routine general medical examination at a health care facility 01/11/2016  . GERD (gastroesophageal reflux disease) 03/09/2014    Scot Jun, PTA 06/24/2018, 4:56 PM  [place], [place], [address] Phone: [phone]   Fax:  [phone]  Name: Ebony Allen MRN: [id number] Date of Birth: 07-23-1971

## 2018-06-25 ENCOUNTER — Encounter: Payer: Self-pay | Admitting: Internal Medicine

## 2018-06-25 ENCOUNTER — Telehealth: Payer: Self-pay | Admitting: Internal Medicine

## 2018-06-25 NOTE — Telephone Encounter (Signed)
Okay to write note to clear for back to work .

## 2018-06-25 NOTE — Telephone Encounter (Signed)
Patient called to follow up and  she states her employment needs the note before 5:00 pm today

## 2018-06-25 NOTE — Telephone Encounter (Signed)
Letter has been completed & faxed to employer.

## 2018-06-25 NOTE — Telephone Encounter (Signed)
Copied from [place] [id number]. Topic: Inquiry >> Jun 25, 2018  8:54 AM Scherrie Gerlach wrote: Reason for CRM: pt requesting work note writing her out for the past 2 weeks. (9/16-9/27) Pt needs today and request you put on her mychart.  Pt written out for FMLA by her dr >> Jun 25, 2018 11:04 AM Percell Belt A wrote: Pt called again about this note. Just fyi    She also needs the note to say she is cleared to go back to work. Dr. Sharlet Salina are you okay with this and would you like for me to complete it?  FAX to HR when finished. [phone]

## 2018-06-26 ENCOUNTER — Ambulatory Visit: Payer: 59 | Attending: Internal Medicine | Admitting: Physical Therapy

## 2018-06-26 ENCOUNTER — Encounter: Payer: Self-pay | Admitting: Physical Therapy

## 2018-06-26 DIAGNOSIS — M62830 Muscle spasm of back: Secondary | ICD-10-CM

## 2018-06-26 DIAGNOSIS — M545 Low back pain, unspecified: Secondary | ICD-10-CM

## 2018-06-26 DIAGNOSIS — M6281 Muscle weakness (generalized): Secondary | ICD-10-CM | POA: Diagnosis present

## 2018-06-26 DIAGNOSIS — M542 Cervicalgia: Secondary | ICD-10-CM | POA: Diagnosis present

## 2018-06-26 NOTE — Therapy (Signed)
[place], [place], [address] Phone: [phone]   Fax:  [phone]  Physical Therapy Treatment  Patient Details  Name: Ebony Allen MRN: [id number] Date of Birth: [birth year] Referring Provider (PT): Loney Loh Date: 06/26/2018  PT End of Session - 06/26/18 6834    Visit Number  14    Date for PT Re-Evaluation  06/26/18    PT Start Time  1600    PT Stop Time  1655    PT Time Calculation (min)  55 min    Activity Tolerance  Patient tolerated treatment well    Behavior During Therapy  [facility] for tasks assessed/performed       Past Medical History:  Diagnosis Date  . Anemia   . GERD (gastroesophageal reflux disease)     Past Surgical History:  Procedure Laterality Date  . DILATATION & CURETTAGE/HYSTEROSCOPY WITH MYOSURE N/A 11/17/2014   Procedure: DILATATION & CURETTAGE/HYSTEROSCOPY WITH MYOSURE;  Surgeon: Delsa Bern, MD;  Location: [facility];  Service: Gynecology;  Laterality: N/A;  . DILITATION & CURRETTAGE/HYSTROSCOPY WITH VERSAPOINT RESECTION N/A 05/20/2015   Procedure: DILATATION & CURETTAGE/HYSTEROSCOPY WITH VERSAPOINT RESECTION;  Surgeon: Delsa Bern, MD;  Location: [facility];  Service: Gynecology;  Laterality: N/A;  . esophageal dilation     Dr Ronalee Red  . ROBOT ASSISTED MYOMECTOMY N/A 11/17/2014   Procedure: ROBOTIC ASSISTED MYOMECTOMY;  Surgeon: Delsa Bern, MD;  Location: [facility];  Service: Gynecology;  Laterality: N/A;    There were no vitals filed for this visit.  Subjective Assessment - 06/26/18 1606    Subjective  "I feel pretty good, back is about a 3"    Currently in Pain?  Yes    Pain Score  3     Pain Location  Back    Pain Orientation  Lower                       [facility] Adult PT Treatment/Exercise - 06/26/18 0001      Ambulation/Gait   Gait Comments  3 flights hilding 4lb dumbbells       Exercises   Exercises  Lumbar      Lumbar Exercises:  Aerobic   Elliptical  I R 19mn each     UBE (Upper Arm Bike)  I10 R5 2/2    Nustep  L 4 4 min      Lumbar Exercises: Machines for Strengthening   Cybex Knee Extension  10lb 2x10     Cybex Knee Flexion  25lb 2x10     Other Lumbar Machine Exercise  Rows and lats 20lb 2x15       Lumbar Exercises: Standing   Other Standing Lumbar Exercises  Walking lunges iso OHP 3lb 2x6    Other Standing Lumbar Exercises  RDL 5lb to 6in box 2x10       Lumbar Exercises: Supine   Other Supine Lumbar Exercises  LE on Pball bridges, HS culs,, Oblq      Lumbar Exercises: Prone   Other Prone Lumbar Exercises  Plank with hip slap 3x5       Modalities   Modalities  Electrical Stimulation;Moist Heat      Moist Heat Therapy   Number Minutes Moist Heat  15 Minutes    Moist Heat Location  Lumbar Spine      Electrical Stimulation   Electrical Stimulation Location  lumbar spine    Electrical Stimulation Action  IFC  Electrical Stimulation Parameters  supine    Electrical Stimulation Goals  Pain               PT Short Term Goals - 05/20/18 1607      PT SHORT TERM GOAL #1   Title  Pt will require minimal cuing and be able to verbalize HEP         PT Long Term Goals - 06/26/18 1640      PT LONG TERM GOAL #2   Title  Pt will be able to work full shift and perform all work related activites with 0/10 pain)    Status  Partially Met      PT LONG TERM GOAL #3   Title  pt will be able to return to working out 4 times a week with </= to 2/10 pain    Status  Partially Met      PT LONG TERM GOAL #4   Title  increase SLR to 60 degrees    Status  Achieved            Plan - 06/26/18 1641    Clinical Impression Statement  Pt tolerated today's exercises fair. Pt demo ed core weakness with planks and bridges. Cues needed to not allow back to round during RDL. Progresses to stair negotiation without issue but was taxing on pt, she also reports a little back pain.    Rehab Potential  Good     PT Treatment/Interventions  ADLs/Self Care Home Management;Cryotherapy;Electrical Stimulation;Moist Heat;Therapeutic exercise;Therapeutic activities;Neuromuscular re-education;Manual techniques;Patient/family education;Dry needling    PT Next Visit Plan  core strengthening and lumbar ROM, progress to I gym program       Patient will benefit from skilled therapeutic intervention in order to improve the following deficits and impairments:  Decreased range of motion, Increased muscle spasms, Pain, Impaired flexibility, Improper body mechanics, Decreased strength, Decreased mobility  Visit Diagnosis: Acute bilateral low back pain without sciatica  Muscle weakness (generalized)  Muscle spasm of back     Problem List Patient Active Problem List   Diagnosis Date Noted  . Hypermobility syndrome 01/03/2017  . Nonallopathic lesion of thoracic region 01/03/2017  . Nonallopathic lesion of lumbosacral region 01/03/2017  . Nonallopathic lesion of sacral region 01/03/2017  . Acute bilateral low back pain without sciatica 09/11/2016  . Routine general medical examination at a health care facility 01/11/2016  . GERD (gastroesophageal reflux disease) 03/09/2014    Scot Jun, PTA 06/26/2018, 4:44 PM  [place], [place], [address] Phone: [phone]   Fax:  [phone]  Name: Ebony Allen MRN: [id number] Date of Birth: 1971-04-27

## 2018-06-27 NOTE — Progress Notes (Signed)
Ebony Allen - 47 y.o. female MRN [id number]  Date of birth: 25-Apr-1971  SUBJECTIVE:  Including CC & ROS.  Chief Complaint  Patient presents with  . Follow-up    Back pain    Ebony Allen is a 47 y.o. female that is presenting w/ c/o back pain.  She states that she is feeling better.  She's been going to PT and feels that she's improving.  She rates her pain as a 4-5/10 aching pain.  She will get some radiating pain into her B buttocks and hips.  She has been doing her HEP as prescribed by PT.  She is no longer taking her prescribed medications due to being back to work and not wanting to feel drowsy.  She does take Aleve for headaches.  Independent review of the lumbar spine x-ray from 1/3 shows no significant degenerative changes.   Review of Systems  Constitutional: Negative for fever.  HENT: Negative for congestion.   Respiratory: Negative for cough.   Cardiovascular: Negative for chest pain.  Gastrointestinal: Negative for abdominal pain.  Musculoskeletal: Positive for back pain.  Skin: Negative for color change.  Neurological: Negative for weakness.  Hematological: Negative for adenopathy.  Psychiatric/Behavioral: Negative for agitation.    HISTORY: Past Medical, Surgical, Social, and Family History Reviewed & Updated per EMR.   Pertinent Historical Findings include:  Past Medical History:  Diagnosis Date  . Anemia   . GERD (gastroesophageal reflux disease)     Past Surgical History:  Procedure Laterality Date  . DILATATION & CURETTAGE/HYSTEROSCOPY WITH MYOSURE N/A 11/17/2014   Procedure: DILATATION & CURETTAGE/HYSTEROSCOPY WITH MYOSURE;  Surgeon: Delsa Bern, MD;  Location: [facility];  Service: Gynecology;  Laterality: N/A;  . DILITATION & CURRETTAGE/HYSTROSCOPY WITH VERSAPOINT RESECTION N/A 05/20/2015   Procedure: DILATATION & CURETTAGE/HYSTEROSCOPY WITH VERSAPOINT RESECTION;  Surgeon: Delsa Bern, MD;  Location: [facility];  Service: Gynecology;  Laterality: N/A;  .  esophageal dilation     Dr Ronalee Red  . ROBOT ASSISTED MYOMECTOMY N/A 11/17/2014   Procedure: ROBOTIC ASSISTED MYOMECTOMY;  Surgeon: Delsa Bern, MD;  Location: [facility];  Service: Gynecology;  Laterality: N/A;    Allergies  Allergen Reactions  . Latex Swelling  . Nickel Rash    No family history on file.   Social History   Socioeconomic History  . Marital status: Single    Spouse name: Not on file  . Number of children: Not on file  . Years of education: Not on file  . Highest education level: Not on file  Occupational History  . Not on file  Social Needs  . Financial resource strain: Not on file  . Food insecurity:    Worry: Not on file    Inability: Not on file  . Transportation needs:    Medical: Not on file    Non-medical: Not on file  Tobacco Use  . Smoking status: Never Smoker  . Smokeless tobacco: Never Used  Substance and Sexual Activity  . Alcohol use: Yes    Comment:  socially  . Drug use: No  . Sexual activity: Yes    Birth control/protection: None  Lifestyle  . Physical activity:    Days per week: Not on file    Minutes per session: Not on file  . Stress: Not on file  Relationships  . Social connections:    Talks on phone: Not on file    Gets together: Not on file    Attends religious service: Not on file    Active  member of club or organization: Not on file    Attends meetings of clubs or organizations: Not on file    Relationship status: Not on file  . Intimate partner violence:    Fear of current or ex partner: Not on file    Emotionally abused: Not on file    Physically abused: Not on file    Forced sexual activity: Not on file  Other Topics Concern  . Not on file  Social History Narrative  . Not on file     PHYSICAL EXAM:  VS: BP 110/80 (BP Location: Left Arm, Patient Position: Sitting, Cuff Size: Normal)   Pulse 63   Ht 5\' 3"  (1.6 m)   Wt 136 lb (61.7 kg)   SpO2 100%   BMI 24.09 kg/m  Physical Exam Gen: NAD, alert, cooperative  with exam, well-appearing ENT: normal lips, normal nasal mucosa,  Eye: normal EOM, normal conjunctiva and lids CV:  no edema, +2 pedal pulses   Resp: no accessory muscle use, non-labored,  Skin: no rashes, no areas of induration  Neuro: normal tone, normal sensation to touch Psych:  normal insight, alert and oriented MSK:  Back:  No tenderness palpation of midline lumbar spine. Normal internal and external rotation of the hips. Normal strength resistance with hip flexion. Negative straight leg raise bilaterally. Normal strength resistance with plantarflexion and dorsiflexion. Neurovascular intact     ASSESSMENT & PLAN:   Acute bilateral low back pain without sciatica Has completed several visits of physical therapy with some improvement of her symptoms.  Pain is likely myofascial in nature. -Prednisone today. -Counseled on posture. -If no improvement can consider trigger point injections versus manipulation.

## 2018-06-28 ENCOUNTER — Encounter: Payer: Self-pay | Admitting: Family Medicine

## 2018-06-28 ENCOUNTER — Ambulatory Visit: Payer: 59 | Admitting: Family Medicine

## 2018-06-28 ENCOUNTER — Encounter: Payer: Self-pay | Admitting: *Deleted

## 2018-06-28 DIAGNOSIS — M545 Low back pain, unspecified: Secondary | ICD-10-CM

## 2018-06-28 MED ORDER — PREDNISONE 5 MG PO TABS: ORAL_TABLET | ORAL | 0 refills | Status: DC

## 2018-06-28 NOTE — Patient Instructions (Signed)
Nice to meet you  Please try using Aspercreme with lidocaine over the area. Please try to avoid sitting for long periods of time.  If you do go from sitting to standing then try stretching in between. You can try heat over the area. Please see me back in 3 to 4 weeks if your symptoms are not improving.

## 2018-06-30 NOTE — Assessment & Plan Note (Signed)
Has completed several visits of physical therapy with some improvement of her symptoms.  Pain is likely myofascial in nature. -Prednisone today. -Counseled on posture. -If no improvement can consider trigger point injections versus manipulation.

## 2018-07-02 ENCOUNTER — Ambulatory Visit: Payer: 59 | Admitting: Physical Therapy

## 2018-07-02 ENCOUNTER — Encounter: Payer: Self-pay | Admitting: Physical Therapy

## 2018-07-02 DIAGNOSIS — M545 Low back pain, unspecified: Secondary | ICD-10-CM

## 2018-07-02 DIAGNOSIS — M62830 Muscle spasm of back: Secondary | ICD-10-CM

## 2018-07-02 DIAGNOSIS — M6281 Muscle weakness (generalized): Secondary | ICD-10-CM

## 2018-07-02 NOTE — Therapy (Signed)
[place], [place], [address] Phone: [phone]   Fax:  [phone]  Physical Therapy Treatment  Patient Details  Name: Ebony Allen MRN: [id number] Date of Birth: 1971/09/25 Referring Provider (PT): Loney Loh Date: 07/02/2018  PT End of Session - 07/02/18 1651    Visit Number  15    Date for PT Re-Evaluation  07/27/18    PT Start Time  1600    PT Stop Time  1702    PT Time Calculation (min)  62 min    Activity Tolerance  Patient tolerated treatment well    Behavior During Therapy  [facility] for tasks assessed/performed       Past Medical History:  Diagnosis Date  . Anemia   . GERD (gastroesophageal reflux disease)     Past Surgical History:  Procedure Laterality Date  . DILATATION & CURETTAGE/HYSTEROSCOPY WITH MYOSURE N/A 11/17/2014   Procedure: DILATATION & CURETTAGE/HYSTEROSCOPY WITH MYOSURE;  Surgeon: Delsa Bern, MD;  Location: [facility];  Service: Gynecology;  Laterality: N/A;  . DILITATION & CURRETTAGE/HYSTROSCOPY WITH VERSAPOINT RESECTION N/A 05/20/2015   Procedure: DILATATION & CURETTAGE/HYSTEROSCOPY WITH VERSAPOINT RESECTION;  Surgeon: Delsa Bern, MD;  Location: [facility];  Service: Gynecology;  Laterality: N/A;  . esophageal dilation     Dr Ronalee Red  . ROBOT ASSISTED MYOMECTOMY N/A 11/17/2014   Procedure: ROBOTIC ASSISTED MYOMECTOMY;  Surgeon: Delsa Bern, MD;  Location: [facility];  Service: Gynecology;  Laterality: N/A;    There were no vitals filed for this visit.  Subjective Assessment - 07/02/18 1600    Subjective  Pt reports that she is doing ok but the pain has moved over to the R side of her lower back. Pt stated that paper work for a standing desk at work has been filed     Currently in Pain?  Yes    Pain Score  4     Pain Orientation  Right                       [facility] Adult PT Treatment/Exercise - 07/02/18 0001      Exercises   Exercises  Lumbar       Lumbar Exercises: Aerobic   Elliptical  I10 R 7mn  2 each     Nustep  L 4 5 min      Lumbar Exercises: Machines for Strengthening   Cybex Knee Extension  10lb 2x10     Cybex Knee Flexion  25lb 2x10     Other Lumbar Machine Exercise  Rows and lats 25lb 2x15       Lumbar Exercises: Standing   Other Standing Lumbar Exercises  Bent over DB rows 5lb 2x10' Hip abd and ext red x10     Other Standing Lumbar Exercises  RDL 5lb to 6in box 2x10; Fwd step ups with 5lb DB; Latera step ups with 5lb db      Modalities   Modalities  Electrical Stimulation;Moist Heat      Moist Heat Therapy   Number Minutes Moist Heat  15 Minutes    Moist Heat Location  Lumbar Spine      Electrical Stimulation   Electrical Stimulation Location  lumbar spine    Electrical Stimulation Action  IFC    Electrical Stimulation Parameters  supine    Electrical Stimulation Goals  Pain               PT Short Term  Goals - 05/20/18 1607      PT SHORT TERM GOAL #1   Title  Pt will require minimal cuing and be able to verbalize HEP         PT Long Term Goals - 06/26/18 1640      PT LONG TERM GOAL #2   Title  Pt will be able to work full shift and perform all work related activites with 0/10 pain)    Status  Partially Met      PT LONG TERM GOAL #3   Title  pt will be able to return to working out 4 times a week with </= to 2/10 pain    Status  Partially Met      PT LONG TERM GOAL #4   Title  increase SLR to 60 degrees    Status  Achieved      Additional Long Term Goals   Additional Long Term Goals  Yes      PT LONG TERM GOAL #6   Title  patient will return to gym safely without questions about her routine    Time  4    Period  Weeks    Status  New            Plan - 07/02/18 1651    Clinical Impression Statement  Pt reports that her pain has moved to the R side of her low back. Pt does display some core weakness with step up interventions with unilateral resistance. Lateral step up  with a isometric OHP on non working side. Core weakness noted with standing hip extensions.     Rehab Potential  Good    PT Frequency  2x / week    PT Duration  8 weeks    PT Treatment/Interventions  ADLs/Self Care Home Management;Cryotherapy;Electrical Stimulation;Moist Heat;Therapeutic exercise;Therapeutic activities;Neuromuscular re-education;Manual techniques;Patient/family education;Dry needling    PT Next Visit Plan  core strengthening and lumbar ROM, progress to I gym program       Patient will benefit from skilled therapeutic intervention in order to improve the following deficits and impairments:  Decreased range of motion, Increased muscle spasms, Pain, Impaired flexibility, Improper body mechanics, Decreased strength, Decreased mobility, Hypermobility  Visit Diagnosis: Acute bilateral low back pain without sciatica  Muscle weakness (generalized)  Muscle spasm of back     Problem List Patient Active Problem List   Diagnosis Date Noted  . Hypermobility syndrome 01/03/2017  . Nonallopathic lesion of thoracic region 01/03/2017  . Nonallopathic lesion of lumbosacral region 01/03/2017  . Nonallopathic lesion of sacral region 01/03/2017  . Acute bilateral low back pain without sciatica 09/11/2016  . Routine general medical examination at a health care facility 01/11/2016  . GERD (gastroesophageal reflux disease) 03/09/2014    Scot Jun, PTA 07/02/2018, 4:54 PM  [place], [place], [address] Phone: [phone]   Fax:  [phone]  Name: Ebony Allen MRN: [id number] Date of Birth: June 20, 1971

## 2018-07-03 ENCOUNTER — Encounter: Payer: Self-pay | Admitting: Physical Therapy

## 2018-07-03 ENCOUNTER — Ambulatory Visit: Payer: 59 | Admitting: Physical Therapy

## 2018-07-03 DIAGNOSIS — M545 Low back pain, unspecified: Secondary | ICD-10-CM

## 2018-07-03 DIAGNOSIS — M62830 Muscle spasm of back: Secondary | ICD-10-CM

## 2018-07-03 DIAGNOSIS — M6281 Muscle weakness (generalized): Secondary | ICD-10-CM

## 2018-07-03 NOTE — Therapy (Signed)
[place], [place], [address] Phone: [phone]   Fax:  [phone]  Physical Therapy Treatment  Patient Details  Name: Ebony Allen MRN: [id number] Date of Birth: [birth year] Referring Provider (PT): Loney Loh Date: 07/03/2018  PT End of Session - 07/03/18 1648    Visit Number  16    Date for PT Re-Evaluation  07/27/18    PT Start Time  1600    PT Stop Time  1700    PT Time Calculation (min)  60 min    Activity Tolerance  Patient tolerated treatment well    Behavior During Therapy  [facility] for tasks assessed/performed       Past Medical History:  Diagnosis Date  . Anemia   . GERD (gastroesophageal reflux disease)     Past Surgical History:  Procedure Laterality Date  . DILATATION & CURETTAGE/HYSTEROSCOPY WITH MYOSURE N/A 11/17/2014   Procedure: DILATATION & CURETTAGE/HYSTEROSCOPY WITH MYOSURE;  Surgeon: Delsa Bern, MD;  Location: [facility];  Service: Gynecology;  Laterality: N/A;  . DILITATION & CURRETTAGE/HYSTROSCOPY WITH VERSAPOINT RESECTION N/A 05/20/2015   Procedure: DILATATION & CURETTAGE/HYSTEROSCOPY WITH VERSAPOINT RESECTION;  Surgeon: Delsa Bern, MD;  Location: [facility];  Service: Gynecology;  Laterality: N/A;  . esophageal dilation     Dr Ronalee Red  . ROBOT ASSISTED MYOMECTOMY N/A 11/17/2014   Procedure: ROBOTIC ASSISTED MYOMECTOMY;  Surgeon: Delsa Bern, MD;  Location: [facility];  Service: Gynecology;  Laterality: N/A;    There were no vitals filed for this visit.  Subjective Assessment - 07/03/18 1602    Subjective  "Not great" Pt reports some LBP on the R side and some soreness    Currently in Pain?  Yes    Pain Score  5     Pain Location  Back    Pain Orientation  Right                       [facility] Adult PT Treatment/Exercise - 07/03/18 0001      Lumbar Exercises: Stretches   Passive Hamstring Stretch  Left;Right;4 reps;10 seconds    Single Knee to  Chest Stretch  Left;Right;10 seconds;4 reps    Double Knee to Chest Stretch  10 seconds;3 reps    Other Lumbar Stretch Exercise  Lower trunk rotations 4 x10''      Lumbar Exercises: Aerobic   UBE (Upper Arm Bike)  L4 2 min each way     Nustep  L 4 5 min      Lumbar Exercises: Machines for Strengthening   Cybex Knee Extension  10lb 2x10     Cybex Knee Flexion  25lb 2x15       Lumbar Exercises: Standing   Other Standing Lumbar Exercises  RDL 5lb to 6in box 2x10; Fwd step ups with 5lb DB; Latera step ups with 5lb db      Lumbar Exercises: Supine   Bridge  Compliant;2 seconds;20 reps      Modalities   Modalities  Electrical Stimulation;Moist Heat      Moist Heat Therapy   Number Minutes Moist Heat  15 Minutes    Moist Heat Location  Lumbar Spine      Electrical Stimulation   Electrical Stimulation Location  lumbar spine    Electrical Stimulation Action  IFC    Electrical Stimulation Parameters  supine    Electrical Stimulation Goals  Pain      Manual Therapy  Manual Therapy  Soft tissue mobilization    Soft tissue mobilization  Lumbar para spinales, extensios               PT Short Term Goals - 05/20/18 1607      PT SHORT TERM GOAL #1   Title  Pt will require minimal cuing and be able to verbalize HEP         PT Long Term Goals - 06/26/18 1640      PT LONG TERM GOAL #2   Title  Pt will be able to work full shift and perform all work related activites with 0/10 pain)    Status  Partially Met      PT LONG TERM GOAL #3   Title  pt will be able to return to working out 4 times a week with </= to 2/10 pain    Status  Partially Met      PT LONG TERM GOAL #4   Title  increase SLR to 60 degrees    Status  Achieved      Additional Long Term Goals   Additional Long Term Goals  Yes      PT LONG TERM GOAL #6   Title  patient will return to gym safely without questions about her routine    Time  4    Period  Weeks    Status  New            Plan -  07/03/18 1649    Clinical Impression Statement  Pt enters clinic with increase tightness in her lumbar extensor more so in the R side. Noticeable tightness noted with palpation. She did well with the exercises interventions did report increase tension with [place] dead lifts. Positive response to MT, with increase soft tissue elasticity with palpation. Pt also reports no pain after STM.    PT Frequency  2x / week    PT Duration  8 weeks    PT Treatment/Interventions  ADLs/Self Care Home Management;Cryotherapy;Electrical Stimulation;Moist Heat;Therapeutic exercise;Therapeutic activities;Neuromuscular re-education;Manual techniques;Patient/family education;Dry needling    PT Next Visit Plan  core strengthening and lumbar ROM, progress to I gym program       Patient will benefit from skilled therapeutic intervention in order to improve the following deficits and impairments:  Decreased range of motion, Increased muscle spasms, Pain, Impaired flexibility, Improper body mechanics, Decreased strength, Decreased mobility, Hypermobility  Visit Diagnosis: Muscle weakness (generalized)  Acute bilateral low back pain without sciatica  Muscle spasm of back     Problem List Patient Active Problem List   Diagnosis Date Noted  . Hypermobility syndrome 01/03/2017  . Nonallopathic lesion of thoracic region 01/03/2017  . Nonallopathic lesion of lumbosacral region 01/03/2017  . Nonallopathic lesion of sacral region 01/03/2017  . Acute bilateral low back pain without sciatica 09/11/2016  . Routine general medical examination at a health care facility 01/11/2016  . GERD (gastroesophageal reflux disease) 03/09/2014    Scot Jun, PTA 07/03/2018, 4:50 PM  [place], [place], [address] Phone: [phone]   Fax:  [phone]  Name: Ebony Allen MRN: [id number] Date of Birth: [birth year]

## 2018-07-09 ENCOUNTER — Ambulatory Visit: Payer: 59 | Admitting: Physical Therapy

## 2018-07-09 ENCOUNTER — Encounter: Payer: Self-pay | Admitting: Physical Therapy

## 2018-07-09 DIAGNOSIS — M545 Low back pain, unspecified: Secondary | ICD-10-CM

## 2018-07-09 DIAGNOSIS — M6281 Muscle weakness (generalized): Secondary | ICD-10-CM

## 2018-07-09 DIAGNOSIS — M62830 Muscle spasm of back: Secondary | ICD-10-CM

## 2018-07-09 NOTE — Therapy (Signed)
[place], [place], [address] Phone: [phone]   Fax:  [phone]  Physical Therapy Treatment  Patient Details  Name: Ebony Allen MRN: [id number] Date of Birth: 05/01/1971 Referring Provider (PT): Loney Loh Date: 07/09/2018  PT End of Session - 07/09/18 1701    Visit Number  17    Number of Visits  23    Date for PT Re-Evaluation  07/27/18    PT Start Time  8264    PT Stop Time  1583    PT Time Calculation (min)  62 min    Activity Tolerance  Patient tolerated treatment well;Patient limited by pain    Behavior During Therapy  [facility] for tasks assessed/performed       Past Medical History:  Diagnosis Date  . Anemia   . GERD (gastroesophageal reflux disease)     Past Surgical History:  Procedure Laterality Date  . DILATATION & CURETTAGE/HYSTEROSCOPY WITH MYOSURE N/A 11/17/2014   Procedure: DILATATION & CURETTAGE/HYSTEROSCOPY WITH MYOSURE;  Surgeon: Delsa Bern, MD;  Location: [facility];  Service: Gynecology;  Laterality: N/A;  . DILITATION & CURRETTAGE/HYSTROSCOPY WITH VERSAPOINT RESECTION N/A 05/20/2015   Procedure: DILATATION & CURETTAGE/HYSTEROSCOPY WITH VERSAPOINT RESECTION;  Surgeon: Delsa Bern, MD;  Location: [facility];  Service: Gynecology;  Laterality: N/A;  . esophageal dilation     Dr Ronalee Red  . ROBOT ASSISTED MYOMECTOMY N/A 11/17/2014   Procedure: ROBOTIC ASSISTED MYOMECTOMY;  Surgeon: Delsa Bern, MD;  Location: [facility];  Service: Gynecology;  Laterality: N/A;    There were no vitals filed for this visit.  Subjective Assessment - 07/09/18 1632    Subjective  Patient with increased back pain after doing a 5k walk this weekend    Currently in Pain?  Yes    Pain Score  6     Pain Location  Back    Pain Orientation  Left    Pain Descriptors / Indicators  Aching;Sore    Aggravating Factors   walking, activity, bending will increase the pain    Pain Relieving Factors   the basic exercise, at best pain a 2-3/10                       [facility] Adult PT Treatment/Exercise - 07/09/18 0001      Lumbar Exercises: Stretches   Passive Hamstring Stretch  Left;Right;4 reps;10 seconds    Piriformis Stretch  Right;Left;10 seconds;4 reps    Other Lumbar Stretch Exercise  quad stretches      Lumbar Exercises: Aerobic   Elliptical  I10 R 55mn  2 each       Lumbar Exercises: Machines for Strengthening   Other Lumbar Machine Exercise  5# hip extension and abduction had to do sets of 5 secondary to increase of pain      Lumbar Exercises: Supine   Other Supine Lumbar Exercises  feet on ball K2C, small trunk rotations, small bridges, the bridges increased her pain      Modalities   Modalities  Electrical Stimulation;Moist Heat;Traction      Moist Heat Therapy   Number Minutes Moist Heat  15 Minutes    Moist Heat Location  Lumbar Spine      Electrical Stimulation   Electrical Stimulation Location  lumbar spine    Electrical Stimulation Action  IFC    Electrical Stimulation Parameters  supine    Electrical Stimulation Goals  Pain  Traction   Type of Traction  Lumbar    Min (lbs)  40    Max (lbs)  55    Hold Time  60    Rest Time  20    Time  12      Manual Therapy   Manual Therapy  Soft tissue mobilization    Soft tissue mobilization  Lumbar paraspinals, tried some manual traction that seemed to feel better               PT Short Term Goals - 05/20/18 1607      PT SHORT TERM GOAL #1   Title  Pt will require minimal cuing and be able to verbalize HEP         PT Long Term Goals - 06/26/18 1640      PT LONG TERM GOAL #2   Title  Pt will be able to work full shift and perform all work related activites with 0/10 pain)    Status  Partially Met      PT LONG TERM GOAL #3   Title  pt will be able to return to working out 4 times a week with </= to 2/10 pain    Status  Partially Met      PT LONG TERM GOAL #4   Title   increase SLR to 60 degrees    Status  Achieved      Additional Long Term Goals   Additional Long Term Goals  Yes      PT LONG TERM GOAL #6   Title  patient will return to gym safely without questions about her routine    Time  4    Period  Weeks    Status  New            Plan - 07/09/18 1704    Clinical Impression Statement  Patient with more pain this week after walking 5K over the weekend, she has increased spasms and tenderness in the lumbar area.  I tried some gentle manual pelvic distraction and she felt like that was helpful, so we tried traction today    PT Next Visit Plan  see if the traction helped her pain    Consulted and Agree with Plan of Care  Patient       Patient will benefit from skilled therapeutic intervention in order to improve the following deficits and impairments:  Decreased range of motion, Increased muscle spasms, Pain, Impaired flexibility, Improper body mechanics, Decreased strength, Decreased mobility, Hypermobility  Visit Diagnosis: Muscle weakness (generalized)  Acute bilateral low back pain without sciatica  Muscle spasm of back     Problem List Patient Active Problem List   Diagnosis Date Noted  . Hypermobility syndrome 01/03/2017  . Nonallopathic lesion of thoracic region 01/03/2017  . Nonallopathic lesion of lumbosacral region 01/03/2017  . Nonallopathic lesion of sacral region 01/03/2017  . Acute bilateral low back pain without sciatica 09/11/2016  . Routine general medical examination at a health care facility 01/11/2016  . GERD (gastroesophageal reflux disease) 03/09/2014    Sumner Boast., PT 07/09/2018, 5:06 PM  [place], [place], [address] Phone: [phone]   Fax:  [phone]  Name: Ebony Allen MRN: [id number] Date of Birth: [birth year]

## 2018-07-11 ENCOUNTER — Ambulatory Visit: Payer: 59 | Admitting: Physical Therapy

## 2018-07-11 ENCOUNTER — Encounter: Payer: Self-pay | Admitting: Physical Therapy

## 2018-07-11 DIAGNOSIS — M542 Cervicalgia: Secondary | ICD-10-CM

## 2018-07-11 DIAGNOSIS — M6281 Muscle weakness (generalized): Secondary | ICD-10-CM

## 2018-07-11 DIAGNOSIS — M545 Low back pain, unspecified: Secondary | ICD-10-CM

## 2018-07-11 DIAGNOSIS — M62830 Muscle spasm of back: Secondary | ICD-10-CM

## 2018-07-11 NOTE — Therapy (Addendum)
[place], [place], [address] Phone: [phone]   Fax:  [phone]  Physical Therapy Treatment  Patient Details  Name: Ebony Allen MRN: [id number] Date of Birth: 09/25/1971 Referring Provider (PT): Loney Loh Date: 07/11/2018  PT End of Session - 07/11/18 1649    Visit Number  18    Number of Visits  23    Date for PT Re-Evaluation  07/27/18    PT Start Time  1619    PT Stop Time  1711    PT Time Calculation (min)  52 min    Activity Tolerance  Patient tolerated treatment well    Behavior During Therapy  [facility] for tasks assessed/performed       Past Medical History:  Diagnosis Date  . Anemia   . GERD (gastroesophageal reflux disease)     Past Surgical History:  Procedure Laterality Date  . DILATATION & CURETTAGE/HYSTEROSCOPY WITH MYOSURE N/A 11/17/2014   Procedure: DILATATION & CURETTAGE/HYSTEROSCOPY WITH MYOSURE;  Surgeon: Delsa Bern, MD;  Location: [facility];  Service: Gynecology;  Laterality: N/A;  . DILITATION & CURRETTAGE/HYSTROSCOPY WITH VERSAPOINT RESECTION N/A 05/20/2015   Procedure: DILATATION & CURETTAGE/HYSTEROSCOPY WITH VERSAPOINT RESECTION;  Surgeon: Delsa Bern, MD;  Location: [facility];  Service: Gynecology;  Laterality: N/A;  . esophageal dilation     Dr Ronalee Red  . ROBOT ASSISTED MYOMECTOMY N/A 11/17/2014   Procedure: ROBOTIC ASSISTED MYOMECTOMY;  Surgeon: Delsa Bern, MD;  Location: [facility];  Service: Gynecology;  Laterality: N/A;    There were no vitals filed for this visit.  Subjective Assessment - 07/11/18 1626    Subjective  Patient feels like the last treatment helped.    Currently in Pain?  Yes    Pain Score  3     Pain Location  Back    Pain Orientation  Left    Pain Descriptors / Indicators  Sore                       [facility] Adult PT Treatment/Exercise - 07/11/18 0001      Lumbar Exercises: Aerobic   Elliptical  I10 R 5mn  2 each        Moist Heat Therapy   Number Minutes Moist Heat  10 Minutes    Moist Heat Location  Lumbar Spine      Electrical Stimulation   Electrical Stimulation Location  lumbar spine    Electrical Stimulation Action  IFC    Electrical Stimulation Parameters  supine    Electrical Stimulation Goals  Pain      Traction   Type of Traction  Lumbar    Min (lbs)  40    Max (lbs)  55    Hold Time  60    Rest Time  20    Time  12      Manual Therapy   Manual Therapy  Soft tissue mobilization    Soft tissue mobilization  STM to the left lumbar area with some gentle passive LE stertches       Trigger Point Dry Needling - 07/11/18 1648    Consent Given?  Yes    Longissimus Response  Palpable increased muscle length;Twitch response elicited             PT Short Term Goals - 05/20/18 1607      PT SHORT TERM GOAL #1   Title  Pt will require minimal cuing and  be able to verbalize HEP         PT Long Term Goals - 06/26/18 1640      PT LONG TERM GOAL #2   Title  Pt will be able to work full shift and perform all work related activites with 0/10 pain)    Status  Partially Met      PT LONG TERM GOAL #3   Title  pt will be able to return to working out 4 times a week with </= to 2/10 pain    Status  Partially Met      PT LONG TERM GOAL #4   Title  increase SLR to 60 degrees    Status  Achieved      Additional Long Term Goals   Additional Long Term Goals  Yes      PT LONG TERM GOAL #6   Title  patient will return to gym safely without questions about her routine    Time  4    Period  Weeks    Status  New            Plan - 07/11/18 1653    Clinical Impression Statement  Spoke with patient about insurance allowing 23 visits, she is at 47 today.  She had a flare up with trying to walk and jog some of a 5K, this really has increased the tenderness and spasms in the left lumbar area.  She also has some increased tight ness of the LE mms    PT Next Visit Plan  assure that  she is good with HEP, treat spasms and pain    Consulted and Agree with Plan of Care  Patient       Patient will benefit from skilled therapeutic intervention in order to improve the following deficits and impairments:  Decreased range of motion, Increased muscle spasms, Pain, Impaired flexibility, Improper body mechanics, Decreased strength, Decreased mobility, Hypermobility  Visit Diagnosis: Muscle weakness (generalized)  Acute bilateral low back pain without sciatica  Muscle spasm of back  Cervicalgia     Problem List Patient Active Problem List   Diagnosis Date Noted  . Hypermobility syndrome 01/03/2017  . Nonallopathic lesion of thoracic region 01/03/2017  . Nonallopathic lesion of lumbosacral region 01/03/2017  . Nonallopathic lesion of sacral region 01/03/2017  . Acute bilateral low back pain without sciatica 09/11/2016  . Routine general medical examination at a health care facility 01/11/2016  . GERD (gastroesophageal reflux disease) 03/09/2014   PHYSICAL THERAPY DISCHARGE SUMMARY   Plan: Patient agrees to discharge.  Patient goals were met. Patient is being discharged due to meeting the stated rehab goals.  ?????      Sumner Boast., PT 07/11/2018, 5:32 PM  [place], [place], [address] Phone: [phone]   Fax:  [phone]  Name: Ebony Allen MRN: [id number] Date of Birth: [birth year]

## 2018-07-25 NOTE — Progress Notes (Signed)
Ebony Allen - 47 y.o. female MRN [id number]  Date of birth: 12-13-1970  SUBJECTIVE:  Including CC & ROS.  Chief Complaint  Patient presents with  . Follow-up    back pain    Ebony Allen is a 47 y.o. female that is presenting for f/u of back pain.  She last saw Dr. Raeford Razor on 06/28/18.  She reports that her back pain has been getting worse.  She reports a pinching sensation in her mid to L lower back.  She's been dealing with a lot of stressful situations.  She has not been taking any of the prescription medications due to having to drive a lot for work.  She does note some radiating pain into her B post. thighs.  She does take some OTC Aleve for her symptoms.  She has not gotten any of the topical Aspercreme.  She has been going to PT but has not been over the past 2 weeks due to some scheduling issues.  She has been doing dry needling which has been helpful and they have given her a HEP.  Independent review of the lumbar spine x-ray from 09/27/2017 shows mild scoliosis.   Review of Systems  Constitutional: Negative for fever.  HENT: Negative for congestion.   Respiratory: Negative for cough.   Cardiovascular: Negative for chest pain.  Gastrointestinal: Negative for abdominal pain.  Musculoskeletal: Positive for back pain.  Skin: Negative for color change.  Neurological: Negative for weakness.  Hematological: Negative for adenopathy.  Psychiatric/Behavioral: Negative for agitation.    HISTORY: Past Medical, Surgical, Social, and Family History Reviewed & Updated per EMR.   Pertinent Historical Findings include:  Past Medical History:  Diagnosis Date  . Anemia   . GERD (gastroesophageal reflux disease)     Past Surgical History:  Procedure Laterality Date  . DILATATION & CURETTAGE/HYSTEROSCOPY WITH MYOSURE N/A 11/17/2014   Procedure: DILATATION & CURETTAGE/HYSTEROSCOPY WITH MYOSURE;  Surgeon: Delsa Bern, MD;  Location: [facility];  Service: Gynecology;  Laterality: N/A;  .  DILITATION & CURRETTAGE/HYSTROSCOPY WITH VERSAPOINT RESECTION N/A 05/20/2015   Procedure: DILATATION & CURETTAGE/HYSTEROSCOPY WITH VERSAPOINT RESECTION;  Surgeon: Delsa Bern, MD;  Location: [facility];  Service: Gynecology;  Laterality: N/A;  . esophageal dilation     Dr Ronalee Red  . ROBOT ASSISTED MYOMECTOMY N/A 11/17/2014   Procedure: ROBOTIC ASSISTED MYOMECTOMY;  Surgeon: Delsa Bern, MD;  Location: [facility];  Service: Gynecology;  Laterality: N/A;    Allergies  Allergen Reactions  . Latex Swelling  . Nickel Rash    No family history on file.   Social History   Socioeconomic History  . Marital status: Single    Spouse name: Not on file  . Number of children: Not on file  . Years of education: Not on file  . Highest education level: Not on file  Occupational History  . Not on file  Social Needs  . Financial resource strain: Not on file  . Food insecurity:    Worry: Not on file    Inability: Not on file  . Transportation needs:    Medical: Not on file    Non-medical: Not on file  Tobacco Use  . Smoking status: Never Smoker  . Smokeless tobacco: Never Used  Substance and Sexual Activity  . Alcohol use: Yes    Comment:  socially  . Drug use: No  . Sexual activity: Yes    Birth control/protection: None  Lifestyle  . Physical activity:    Days per week: Not on file  Minutes per session: Not on file  . Stress: Not on file  Relationships  . Social connections:    Talks on phone: Not on file    Gets together: Not on file    Attends religious service: Not on file    Active member of club or organization: Not on file    Attends meetings of clubs or organizations: Not on file    Relationship status: Not on file  . Intimate partner violence:    Fear of current or ex partner: Not on file    Emotionally abused: Not on file    Physically abused: Not on file    Forced sexual activity: Not on file  Other Topics Concern  . Not on file  Social History Narrative  . Not on  file     PHYSICAL EXAM:  VS: BP 110/80 (BP Location: Left Arm, Patient Position: Sitting, Cuff Size: Normal)   Ht 5\' 3"  (1.6 m)   Wt 141 lb (64 kg)   BMI 24.98 kg/m  Physical Exam Gen: NAD, alert, cooperative with exam, well-appearing ENT: normal lips, normal nasal mucosa,  Eye: normal EOM, normal conjunctiva and lids CV:  no edema, +2 pedal pulses   Resp: no accessory muscle use, non-labored,  Skin: no rashes, no areas of induration  Neuro: normal tone, normal sensation to touch Psych:  normal insight, alert and oriented MSK:  Back:  No tenderness to palpation of the SI joint, piriformis, greater trochanter. No midline tenderness to the lumbar spine. Normal internal and external rotation of the hips. Normal strength resistance with hip flexion, knee flexion extension, plantarflexion and dorsiflexion. Normal gait. Positive straight leg raise on the right. Neurovascular intact     ASSESSMENT & PLAN:   Low back pain with sciatica Pain is been ongoing since June.  Drives a lot with work which seems to exacerbate her symptoms.  Having some sciatica type symptoms going down on the right leg and mildly left leg.  Unable to take some the medications as they do make her drowsy. - MRI lumbar spine to evaluate for nerve impingement  - vimovo  - will send in valium for sedation with MRI  - may need to extend FMLA  - will call once MRI complete   The above documentation has been reviewed and is accurate and complete. Clearance Coots, MD 07/26/2018, 11:31 AM>

## 2018-07-26 ENCOUNTER — Ambulatory Visit: Payer: 59 | Admitting: Family Medicine

## 2018-07-26 ENCOUNTER — Encounter: Payer: Self-pay | Admitting: *Deleted

## 2018-07-26 ENCOUNTER — Encounter: Payer: Self-pay | Admitting: Family Medicine

## 2018-07-26 ENCOUNTER — Telehealth: Payer: Self-pay | Admitting: Internal Medicine

## 2018-07-26 VITALS — BP 110/80 | Ht 63.0 in | Wt 141.0 lb

## 2018-07-26 DIAGNOSIS — M5442 Lumbago with sciatica, left side: Secondary | ICD-10-CM

## 2018-07-26 DIAGNOSIS — G8929 Other chronic pain: Secondary | ICD-10-CM | POA: Diagnosis not present

## 2018-07-26 DIAGNOSIS — M5441 Lumbago with sciatica, right side: Secondary | ICD-10-CM

## 2018-07-26 MED ORDER — NAPROXEN-ESOMEPRAZOLE 500-20 MG PO TBEC: 1.0000 | DELAYED_RELEASE_TABLET | Freq: Two times a day (BID) | ORAL | 3 refills | Status: DC

## 2018-07-26 NOTE — Patient Instructions (Signed)
Good to see you  We will call or you will get a call about the MRI  I will call after it is completed.

## 2018-07-26 NOTE — Assessment & Plan Note (Signed)
Pain is been ongoing since June.  Drives a lot with work which seems to exacerbate her symptoms.  Having some sciatica type symptoms going down on the right leg and mildly left leg.  Unable to take some the medications as they do make her drowsy. - MRI lumbar spine to evaluate for nerve impingement  - vimovo  - will send in valium for sedation with MRI  - may need to extend FMLA  - will call once MRI complete

## 2018-07-26 NOTE — Telephone Encounter (Signed)
Patient would like to get her FMLA extended. Please call back

## 2018-07-29 NOTE — Telephone Encounter (Signed)
Pt called in and I make appt for pt 08/01/2018 at 8 to talk about FMLA

## 2018-07-29 NOTE — Telephone Encounter (Signed)
LVM for call back, I need more information. What is she needs this extend for, etc?   Dr.Crawford would you like for her to set up an appointment to come in and speak about this?

## 2018-07-30 NOTE — Telephone Encounter (Signed)
Spoke with patient & informed her she just needs to see Dr.Schmitz for this and he would need to be the one to complete the paperwork. I have canceled the appointment with Dr.Crawford.   She is requesting the forms from her job and will get them to [place]. She would like for the leave to start next week 11/11 and lasting 2 weeks. She states Dr. Raeford Razor agreed with this during the [place]. I informed her I would need to have a okay for him to complete the forms.   She is also requesting to have a open MRI.

## 2018-07-30 NOTE — Telephone Encounter (Signed)
I would recommend that she see Raeford Razor if able as it appears she has been seeing him for this problem.

## 2018-07-31 NOTE — Telephone Encounter (Signed)
Spoke with patient and informed we could do the forms for dates of 11-11 to 11-25. Patient will bring the forms by the office.

## 2018-07-31 NOTE — Telephone Encounter (Signed)
I can sign FMLA  Rosemarie Ax, MD [facility] 07/31/2018, 8:59 AM

## 2018-08-01 ENCOUNTER — Ambulatory Visit: Payer: 59 | Admitting: Internal Medicine

## 2018-08-02 NOTE — Telephone Encounter (Signed)
Forms have been completed & waiting on Dr.Schmitz to sign.   He will be back at the [place] office on 11/15.

## 2018-08-02 NOTE — Telephone Encounter (Signed)
Pt brought FMLA forms yesterday and would like a callback once forms has been faxed

## 2018-08-05 NOTE — Telephone Encounter (Signed)
Spoke with patient. She was worried about not going to work today because the forms have not been sent in. I informed her I will fax over information to them, & inform that Dr.Schmitz is out of office until 11/5 and unable to sign until then. But he has wrote her out of work starting 11/11 to 11/25.

## 2018-08-05 NOTE — Telephone Encounter (Addendum)
Relation to pt: self  Call back number:[phone] Pharmacy:  Reason for call:  Patient experiencing back pain, FMLA paperwork was due today, patient scheduled to report to work by 9am today. Informed patient of message below, patient requesting to speak with [place] today, please advise

## 2018-08-07 NOTE — Telephone Encounter (Signed)
PT calling to find out if forms were faxed and states she has additional questions for [place] that she would prefer to just discuss with her.

## 2018-08-07 NOTE — Telephone Encounter (Signed)
Spoke with patient. I informed her again that he will not be here in office until 11/15. He will sign it then. &That I did send a letter to her HR letting them know.   She also just wanted to make sure we knew she was going to do the MRI.

## 2018-08-09 NOTE — Telephone Encounter (Signed)
Forms have been signed, faxed, Copy sent to scan.   Original is ready to be picked up or I can mail if patient would like. LVM.

## 2018-08-09 NOTE — Telephone Encounter (Signed)
Pt called back.  She would like for original to be mailed to home address (verified).

## 2018-08-09 NOTE — Telephone Encounter (Signed)
Forms have been mailed.

## 2018-08-13 ENCOUNTER — Encounter: Payer: Self-pay | Admitting: *Deleted

## 2018-08-16 ENCOUNTER — Ambulatory Visit
Admission: RE | Admit: 2018-08-16 | Discharge: 2018-08-16 | Disposition: A | Discharge status: Home / Self Care | Payer: 59 | Source: Ambulatory Visit | Attending: Family Medicine | Admitting: Family Medicine

## 2018-08-16 DIAGNOSIS — M5442 Lumbago with sciatica, left side: Principal | ICD-10-CM

## 2018-08-16 DIAGNOSIS — G8929 Other chronic pain: Secondary | ICD-10-CM

## 2018-08-16 DIAGNOSIS — M5441 Lumbago with sciatica, right side: Principal | ICD-10-CM

## 2018-08-19 ENCOUNTER — Telehealth: Payer: Self-pay | Admitting: Family Medicine

## 2018-08-19 NOTE — Telephone Encounter (Signed)
Spoke with patient about her MRI results. Sent a work [employer] for her to go back to work tomorrow with no restrictions.   Rosemarie Ax, MD [facility] 08/19/2018, 5:06 PM

## 2018-08-19 NOTE — Telephone Encounter (Signed)
Pt states the note that was written to return to work on 08/14/18 she had extended her leave. She was unable to go to work today due to not having a letter and she would like the note written in order to return tomorrow if Dr. Raeford Razor is ok with her returning. Please advise.

## 2018-08-19 NOTE — Telephone Encounter (Signed)
Pt called stating she thought she was supposed to go back to work today but has not heard anything. Patient did not go back to work today. Please call back in regard

## 2018-09-04 LAB — HM PAP SMEAR

## 2018-09-06 ENCOUNTER — Encounter: Payer: Self-pay | Admitting: Internal Medicine

## 2018-11-14 ENCOUNTER — Ambulatory Visit: Payer: 59 | Admitting: Internal Medicine

## 2018-11-15 ENCOUNTER — Encounter: Payer: Self-pay | Admitting: Internal Medicine

## 2018-11-15 ENCOUNTER — Ambulatory Visit (INDEPENDENT_AMBULATORY_CARE_PROVIDER_SITE_OTHER): Payer: Self-pay | Admitting: Internal Medicine

## 2018-11-15 DIAGNOSIS — G8929 Other chronic pain: Secondary | ICD-10-CM

## 2018-11-15 DIAGNOSIS — M5441 Lumbago with sciatica, right side: Secondary | ICD-10-CM

## 2018-11-15 MED ORDER — NAPROXEN 500 MG PO TABS: 500.0000 mg | ORAL_TABLET | Freq: Two times a day (BID) | ORAL | 0 refills | Status: DC

## 2018-11-15 MED ORDER — METHOCARBAMOL 500 MG PO TABS: 500.0000 mg | ORAL_TABLET | Freq: Three times a day (TID) | ORAL | 0 refills | Status: DC | PRN

## 2018-11-15 MED ORDER — TRAMADOL HCL 50 MG PO TABS: 50.0000 mg | ORAL_TABLET | Freq: Three times a day (TID) | ORAL | 0 refills | Status: DC | PRN
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
## 2018-11-15 NOTE — Patient Instructions (Signed)
We have sent in the tramadol, naproxen, and the muscle relaxer so that you have that to use if needed.

## 2018-11-15 NOTE — Assessment & Plan Note (Signed)
Rx for tramadol and robaxin and naproxen to use for pain. Encouraged her to resume stretching exercises to help.

## 2020-02-10 LAB — HM COLONOSCOPY

## 2020-04-29 ENCOUNTER — Encounter: Payer: Self-pay | Admitting: Internal Medicine

## 2020-04-29 ENCOUNTER — Other Ambulatory Visit: Payer: Self-pay

## 2020-04-29 ENCOUNTER — Ambulatory Visit (INDEPENDENT_AMBULATORY_CARE_PROVIDER_SITE_OTHER): Payer: 59 | Admitting: Internal Medicine

## 2020-04-29 DIAGNOSIS — Z0000 Encounter for general adult medical examination without abnormal findings: Secondary | ICD-10-CM

## 2020-04-29 NOTE — Assessment & Plan Note (Signed)
Flu shot yearly. Covid-19 up to date. Shingrix counseled due at 52. Tetanus up to date. Colonoscopy up to date until 2030. Mammogram up to date, pap smear up to date. Counseled about sun safety and mole surveillance. Counseled about the dangers of distracted driving. Given 10 year screening recommendations.

## 2020-04-29 NOTE — Patient Instructions (Signed)
Health Maintenance, Female Adopting a healthy lifestyle and getting preventive care are important in promoting health and wellness. Ask your health care provider about:  The right schedule for you to have regular tests and exams.  Things you can do on your own to prevent diseases and keep yourself healthy. What should I know about diet, weight, and exercise? Eat a healthy diet   Eat a diet that includes plenty of vegetables, fruits, low-fat dairy products, and lean protein.  Do not eat a lot of foods that are high in solid fats, added sugars, or sodium. Maintain a healthy weight Body mass index (BMI) is used to identify weight problems. It estimates body fat based on height and weight. Your health care provider can help determine your BMI and help you achieve or maintain a healthy weight. Get regular exercise Get regular exercise. This is one of the most important things you can do for your health. Most adults should:  Exercise for at least 150 minutes each week. The exercise should increase your heart rate and make you sweat (moderate-intensity exercise).  Do strengthening exercises at least twice a week. This is in addition to the moderate-intensity exercise.  Spend less time sitting. Even light physical activity can be beneficial. Watch cholesterol and blood lipids Have your blood tested for lipids and cholesterol at 50 years of age, then have this test every 5 years. Have your cholesterol levels checked more often if:  Your lipid or cholesterol levels are high.  You are older than 50 years of age.  You are at high risk for heart disease. What should I know about cancer screening? Depending on your health history and family history, you may need to have cancer screening at various ages. This may include screening for:  Breast cancer.  Cervical cancer.  Colorectal cancer.  Skin cancer.  Lung cancer. What should I know about heart disease, diabetes, and high blood  pressure? Blood pressure and heart disease  High blood pressure causes heart disease and increases the risk of stroke. This is more likely to develop in people who have high blood pressure readings, are of African descent, or are overweight.  Have your blood pressure checked: ? Every 3-5 years if you are 18-39 years of age. ? Every year if you are 40 years old or older. Diabetes Have regular diabetes screenings. This checks your fasting blood sugar level. Have the screening done:  Once every three years after age 40 if you are at a normal weight and have a low risk for diabetes.  More often and at a younger age if you are overweight or have a high risk for diabetes. What should I know about preventing infection? Hepatitis B If you have a higher risk for hepatitis B, you should be screened for this virus. Talk with your health care provider to find out if you are at risk for hepatitis B infection. Hepatitis C Testing is recommended for:  Everyone born from 1945 through 1965.  Anyone with known risk factors for hepatitis C. Sexually transmitted infections (STIs)  Get screened for STIs, including gonorrhea and chlamydia, if: ? You are sexually active and are younger than 50 years of age. ? You are older than 50 years of age and your health care provider tells you that you are at risk for this type of infection. ? Your sexual activity has changed since you were last screened, and you are at increased risk for chlamydia or gonorrhea. Ask your health care provider if   you are at risk.  Ask your health care provider about whether you are at high risk for HIV. Your health care provider may recommend a prescription medicine to help prevent HIV infection. If you choose to take medicine to prevent HIV, you should first get tested for HIV. You should then be tested every 3 months for as long as you are taking the medicine. Pregnancy  If you are about to stop having your period (premenopausal) and  you may become pregnant, seek counseling before you get pregnant.  Take 400 to 800 micrograms (mcg) of folic acid every day if you become pregnant.  Ask for birth control (contraception) if you want to prevent pregnancy. Osteoporosis and menopause Osteoporosis is a disease in which the bones lose minerals and strength with aging. This can result in bone fractures. If you are 65 years old or older, or if you are at risk for osteoporosis and fractures, ask your health care provider if you should:  Be screened for bone loss.  Take a calcium or vitamin D supplement to lower your risk of fractures.  Be given hormone replacement therapy (HRT) to treat symptoms of menopause. Follow these instructions at home: Lifestyle  Do not use any products that contain nicotine or tobacco, such as cigarettes, e-cigarettes, and chewing tobacco. If you need help quitting, ask your health care provider.  Do not use street drugs.  Do not share needles.  Ask your health care provider for help if you need support or information about quitting drugs. Alcohol use  Do not drink alcohol if: ? Your health care provider tells you not to drink. ? You are pregnant, may be pregnant, or are planning to become pregnant.  If you drink alcohol: ? Limit how much you use to 0-1 drink a day. ? Limit intake if you are breastfeeding.  Be aware of how much alcohol is in your drink. In the [place] one drink equals one 12 oz bottle of beer (355 mL), one 5 oz glass of wine (148 mL), or one 1 oz glass of hard liquor (44 mL). General instructions  Schedule regular health, dental, and eye exams.  Stay current with your vaccines.  Tell your health care provider if: ? You often feel depressed. ? You have ever been abused or do not feel safe at home. Summary  Adopting a healthy lifestyle and getting preventive care are important in promoting health and wellness.  Follow your health care provider's instructions about healthy  diet, exercising, and getting tested or screened for diseases.  Follow your health care provider's instructions on monitoring your cholesterol and blood pressure. This information is not intended to replace advice given to you by your health care provider. Make sure you discuss any questions you have with your health care provider. Document Revised: 09/04/2018 Document Reviewed: 09/04/2018 Elsevier Patient Education  2020 [employer]  

## 2020-04-29 NOTE — Progress Notes (Signed)
° °  Subjective:   Patient ID: Ebony Allen, female    DOB: [birth year], 49 y.o.   MRN: [id number]  HPI  The patient is a 49 YO female coming in for physical.   PMH, [place], social history reviewed and updated   Colonoscopy this year [facility] [place] back in 10 years  Review of Systems  Constitutional: Negative.   HENT: Negative.   Eyes: Negative.   Respiratory: Negative for cough, chest tightness and shortness of breath.   Cardiovascular: Negative for chest pain, palpitations and leg swelling.  Gastrointestinal: Negative for abdominal distention, abdominal pain, constipation, diarrhea, nausea and vomiting.  Musculoskeletal: Negative.   Skin: Negative.   Neurological: Negative.   Psychiatric/Behavioral: Negative.     Objective:  Physical Exam Constitutional:      Appearance: She is well-developed.  HENT:     Head: Normocephalic and atraumatic.  Cardiovascular:     Rate and Rhythm: Normal rate and regular rhythm.  Pulmonary:     Effort: Pulmonary effort is normal. No respiratory distress.     Breath sounds: Normal breath sounds. No wheezing or rales.  Abdominal:     General: Bowel sounds are normal. There is no distension.     Palpations: Abdomen is soft.     Tenderness: There is no abdominal tenderness. There is no rebound.  Musculoskeletal:     Cervical back: Normal range of motion.  Skin:    General: Skin is warm and dry.  Neurological:     Mental Status: She is alert and oriented to person, place, and time.     Coordination: Coordination normal.     Vitals:   04/29/20 1040  BP: 106/74  Pulse: 69  Temp: 98 F (36.7 C)  TempSrc: Oral  SpO2: 99%  Weight: 140 lb (63.5 kg)  Height: 5\' 3"  (1.6 m)    This visit occurred during the SARS-CoV-2 public health emergency.  Safety protocols were in place, including screening questions prior to the visit, additional usage of staff PPE, and extensive cleaning of exam room while observing appropriate contact time as indicated for  disinfecting solutions.   Assessment & Plan:

## 2020-05-03 ENCOUNTER — Encounter: Payer: Self-pay | Admitting: Internal Medicine

## 2020-07-08 ENCOUNTER — Emergency Department (HOSPITAL_COMMUNITY)
Admission: EM | Admit: 2020-07-08 | Discharge: 2020-07-08 | Disposition: A | Discharge status: Home / Self Care | Payer: 59 | Attending: Emergency Medicine | Admitting: Emergency Medicine

## 2020-07-08 ENCOUNTER — Encounter (HOSPITAL_COMMUNITY): Payer: Self-pay

## 2020-07-08 ENCOUNTER — Other Ambulatory Visit: Payer: Self-pay

## 2020-07-08 ENCOUNTER — Emergency Department (HOSPITAL_COMMUNITY): Payer: 59

## 2020-07-08 DIAGNOSIS — M549 Dorsalgia, unspecified: Secondary | ICD-10-CM | POA: Insufficient documentation

## 2020-07-08 DIAGNOSIS — R519 Headache, unspecified: Secondary | ICD-10-CM | POA: Insufficient documentation

## 2020-07-08 DIAGNOSIS — M542 Cervicalgia: Secondary | ICD-10-CM | POA: Diagnosis not present

## 2020-07-08 DIAGNOSIS — Z91040 Latex allergy status: Secondary | ICD-10-CM | POA: Insufficient documentation

## 2020-07-08 MED ORDER — IBUPROFEN 200 MG PO TABS
600.0000 mg | ORAL_TABLET | Freq: Once | ORAL | Status: AC
  Administered 2020-07-08: 600 mg via ORAL
  Filled 2020-07-08: qty 3

## 2020-07-08 MED ORDER — ACETAMINOPHEN 500 MG PO TABS
1000.0000 mg | ORAL_TABLET | Freq: Once | ORAL | Status: AC
  Administered 2020-07-08: 1000 mg via ORAL
  Filled 2020-07-08: qty 2
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
## 2020-07-08 NOTE — Discharge Instructions (Addendum)
Please return for any problem.  Use Tylenol as instructed for treatment of your pain -- Two extra strength Tylenol taken every 8 hours.  Ibuprofen can be taken with Tylenol.  The dose of ibuprofen is 600 mg taken every 8 hours.  Take these medications with food.

## 2020-07-15 ENCOUNTER — Ambulatory Visit: Payer: 59 | Admitting: Internal Medicine

## 2020-07-15 ENCOUNTER — Other Ambulatory Visit: Payer: Self-pay

## 2020-07-15 ENCOUNTER — Encounter: Payer: Self-pay | Admitting: Internal Medicine

## 2020-07-15 VITALS — BP 120/82 | HR 63 | Temp 98.2°F | Ht 63.0 in | Wt 144.0 lb

## 2020-07-15 DIAGNOSIS — M25511 Pain in right shoulder: Secondary | ICD-10-CM

## 2020-07-15 DIAGNOSIS — R519 Headache, unspecified: Secondary | ICD-10-CM | POA: Insufficient documentation

## 2020-07-15 DIAGNOSIS — M5416 Radiculopathy, lumbar region: Secondary | ICD-10-CM | POA: Diagnosis not present

## 2020-07-15 DIAGNOSIS — M549 Dorsalgia, unspecified: Secondary | ICD-10-CM | POA: Diagnosis not present

## 2020-07-15 DIAGNOSIS — R071 Chest pain on breathing: Secondary | ICD-10-CM

## 2020-07-15 DIAGNOSIS — R079 Chest pain, unspecified: Secondary | ICD-10-CM | POA: Insufficient documentation

## 2020-07-15 DIAGNOSIS — M25512 Pain in left shoulder: Secondary | ICD-10-CM

## 2020-07-15 MED ORDER — CYCLOBENZAPRINE HCL 5 MG PO TABS: 5.0000 mg | ORAL_TABLET | Freq: Three times a day (TID) | ORAL | 1 refills | Status: DC | PRN

## 2020-07-15 MED ORDER — DIAZEPAM 5 MG PO TABS: ORAL_TABLET | ORAL | 0 refills | Status: DC

## 2020-07-15 MED ORDER — PREDNISONE 10 MG PO TABS: ORAL_TABLET | ORAL | 0 refills | Status: DC

## 2020-07-15 MED ORDER — TRAMADOL HCL 50 MG PO TABS: 50.0000 mg | ORAL_TABLET | Freq: Four times a day (QID) | ORAL | 0 refills | Status: DC | PRN

## 2020-07-15 MED ORDER — KETOROLAC TROMETHAMINE 30 MG/ML IJ SOLN
30.0000 mg | Freq: Once | INTRAMUSCULAR | Status: AC
  Administered 2020-07-15: 30 mg via INTRAVENOUS

## 2020-07-15 NOTE — Patient Instructions (Signed)
You had the toradol 30 mg pain shot today  You can also take tylenol up to 3000 mg total per day  Please take all new medication as prescribed - the tramadol for pain, flexeril for muscle relaxer, prednisone for anti-inflammatory, and the valium  - 1 pill for the MRI if needed  Please continue all other medications as before, and refills have been done if requested.  Please have the pharmacy call with any other refills you may need.  Please keep your appointments with your specialists as you may have planned  You will be contacted regarding the referral for: MRI for the lower back (open MRI if possible)  Please see Dr Sharlet Salina in 1-2 week (after the MRI) for further evaluation
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
## 2020-07-20 ENCOUNTER — Encounter: Payer: Self-pay | Admitting: Internal Medicine

## 2020-07-20 NOTE — Assessment & Plan Note (Signed)
Very low suspicion for cardiac, declines ecg, c/w msk strain, for toradol 30 mg IM today

## 2020-07-20 NOTE — Assessment & Plan Note (Addendum)
Also for tramadol prn, prednisone asd, and LS spine MRI with valium or open mri is able  I spent 41 minutes in preparing to see the patient by review of recent labs, imaging and procedures, obtaining and reviewing separately obtained history, communicating with the patient and family or caregiver, ordering medications, tests or procedures, and documenting clinical information in the EHR including the differential Dx, treatment, and any further evaluation and other management of right lumbar radiculopathy, HA, CP, bilateral shoulder pain, bilateral upper back pain

## 2020-07-20 NOTE — Assessment & Plan Note (Signed)
C/w benign, for toradol 30 mg IM,  to f/u any worsening symptoms or concerns

## 2020-07-20 NOTE — Assessment & Plan Note (Signed)
C/w msk strain, for toradol 30 mg IM

## 2020-07-20 NOTE — Assessment & Plan Note (Signed)
C/w msk strain, for toradol IM 30

## 2020-07-26 ENCOUNTER — Ambulatory Visit
Admission: RE | Admit: 2020-07-26 | Discharge: 2020-07-26 | Disposition: A | Discharge status: Home / Self Care | Payer: Self-pay | Source: Ambulatory Visit | Attending: Internal Medicine | Admitting: Internal Medicine

## 2020-07-26 ENCOUNTER — Other Ambulatory Visit: Payer: Self-pay

## 2020-07-26 DIAGNOSIS — M5416 Radiculopathy, lumbar region: Secondary | ICD-10-CM

## 2020-07-27 ENCOUNTER — Encounter: Payer: Self-pay | Admitting: Internal Medicine

## 2020-07-27 ENCOUNTER — Other Ambulatory Visit: Payer: Self-pay | Admitting: Internal Medicine

## 2020-07-27 DIAGNOSIS — M5416 Radiculopathy, lumbar region: Secondary | ICD-10-CM

## 2021-01-04 ENCOUNTER — Encounter: Payer: Self-pay | Admitting: Internal Medicine

## 2021-01-04 ENCOUNTER — Ambulatory Visit: Payer: 59 | Admitting: Internal Medicine

## 2021-01-04 ENCOUNTER — Other Ambulatory Visit: Payer: Self-pay

## 2021-01-04 VITALS — BP 114/70 | HR 53 | Temp 97.7°F | Resp 18 | Ht 63.0 in | Wt 141.8 lb

## 2021-01-04 DIAGNOSIS — M5441 Lumbago with sciatica, right side: Secondary | ICD-10-CM

## 2021-01-04 DIAGNOSIS — G8929 Other chronic pain: Secondary | ICD-10-CM | POA: Diagnosis not present
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
## 2021-01-04 NOTE — Patient Instructions (Signed)
We will get you in with sports medicine to help get the back to the level you want.

## 2021-01-06 ENCOUNTER — Encounter: Payer: Self-pay | Admitting: Internal Medicine

## 2021-01-06 NOTE — Assessment & Plan Note (Signed)
Referral to sports medicine for assessment and treatment to get her back to prior high level of function and athleticism.

## 2021-01-06 NOTE — Progress Notes (Deleted)
   Subjective:    I'm seeing this patient as a consultation for:  Dr. Sharlet Salina. Note will be routed back to referring provider/PCP.  CC: Low back pain  I, Cesiah Westley, LAT, ATC, am serving as scribe for Dr. Lynne Leader.  HPI: Pt is a 50 y/o female presenting w/ c/o low back pain x .  She locates her pain to .  Radiating pain: LE numbness/tingling: Aggravating factors: Treatments tried:   Diagnostic imaging: L-spine MRI- 07/26/20 and 08/16/18; L-spine XR- 09/27/17  Past medical history, Surgical history, Family history, Social history, Allergies, and medications have been entered into the medical record, reviewed. ***  Review of Systems: No new headache, visual changes, nausea, vomiting, diarrhea, constipation, dizziness, abdominal pain, skin rash, fevers, chills, night sweats, weight loss, swollen lymph nodes, body aches, joint swelling, muscle aches, chest pain, shortness of breath, mood changes, visual or auditory hallucinations.   Objective:   There were no vitals filed for this visit. General: Well Developed, well nourished, and in no acute distress.  Neuro/Psych: Alert and oriented x3, extra-ocular muscles intact, able to move all 4 extremities, sensation grossly intact. Skin: Warm and dry, no rashes noted.  Respiratory: Not using accessory muscles, speaking in full sentences, trachea midline.  Cardiovascular: Pulses palpable, no extremity edema. Abdomen: Does not appear distended. MSK: ***  Lab and Radiology Results No results found for this or any previous visit (from the past 72 hour(s)). No results found.  Impression and Recommendations:    Assessment and Plan: 50 y.o. female with ***.  PDMP not reviewed this encounter. No orders of the defined types were placed in this encounter.  No orders of the defined types were placed in this encounter.   Discussed warning signs or symptoms. Please see discharge instructions. Patient expresses understanding.   ***

## 2021-01-10 ENCOUNTER — Ambulatory Visit: Payer: 59 | Admitting: Family Medicine
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
## 2021-01-12 ENCOUNTER — Ambulatory Visit: Payer: 59 | Admitting: Family Medicine

## 2021-01-12 ENCOUNTER — Encounter: Payer: Self-pay | Admitting: Family Medicine

## 2021-01-12 ENCOUNTER — Other Ambulatory Visit: Payer: Self-pay

## 2021-01-12 ENCOUNTER — Ambulatory Visit (INDEPENDENT_AMBULATORY_CARE_PROVIDER_SITE_OTHER): Payer: 59

## 2021-01-12 ENCOUNTER — Ambulatory Visit (INDEPENDENT_AMBULATORY_CARE_PROVIDER_SITE_OTHER): Payer: 59 | Admitting: Family Medicine

## 2021-01-12 VITALS — BP 102/72 | HR 57 | Ht 63.0 in | Wt 145.0 lb

## 2021-01-12 DIAGNOSIS — M533 Sacrococcygeal disorders, not elsewhere classified: Secondary | ICD-10-CM

## 2021-01-12 DIAGNOSIS — M545 Low back pain, unspecified: Secondary | ICD-10-CM

## 2021-01-12 DIAGNOSIS — G8929 Other chronic pain: Secondary | ICD-10-CM

## 2021-01-12 NOTE — Patient Instructions (Signed)
Thank you for coming in today.  Please get an Xray today before you leave  I've referred you to Physical Therapy.  Let us know if you don't hear from them in one week.  Recheck in 6 weeks.   Let me know if things are not going well.   I think you may have a problem of the SI joint or of the Quadratus Lumborum muscle.   Try Heat.   TENS UNIT: This is helpful for muscle pain and spasm.   Search and Purchase a TENS 7000 2nd edition at  www.tenspros.com or www.[email or web address] It should be less than $30.     TENS unit instructions: Do not shower or bathe with the unit on . Turn the unit off before removing electrodes or batteries . If the electrodes lose stickiness add a drop of water to the electrodes after they are disconnected from the unit and place on plastic sheet. If you continued to have difficulty, call the TENS unit company to purchase more electrodes. . Do not apply lotion on the skin area prior to use. Make sure the skin is clean and dry as this will help prolong the life of the electrodes. . After use, always check skin for unusual red areas, rash or other skin difficulties. If there are any skin problems, does not apply electrodes to the same area. . Never remove the electrodes from the unit by pulling the wires. . Do not use the TENS unit or electrodes other than as directed. . Do not change electrode placement without consultating your therapist or physician. Marland Kitchen Keep 2 fingers with between each electrode. . Wear time ratio is 2:1, on to off times.    For example on for 30 minutes off for 15 minutes and then on for 30 minutes off for 15 minutes

## 2021-01-13 ENCOUNTER — Ambulatory Visit: Payer: 59 | Admitting: Rehabilitative and Restorative Service Providers"

## 2021-01-13 ENCOUNTER — Encounter: Payer: Self-pay | Admitting: Rehabilitative and Restorative Service Providers"

## 2021-01-13 ENCOUNTER — Ambulatory Visit (INDEPENDENT_AMBULATORY_CARE_PROVIDER_SITE_OTHER): Payer: 59 | Admitting: Rehabilitative and Restorative Service Providers"

## 2021-01-13 DIAGNOSIS — G8929 Other chronic pain: Secondary | ICD-10-CM

## 2021-01-13 DIAGNOSIS — R293 Abnormal posture: Secondary | ICD-10-CM

## 2021-01-13 DIAGNOSIS — M6281 Muscle weakness (generalized): Secondary | ICD-10-CM

## 2021-01-13 DIAGNOSIS — M5441 Lumbago with sciatica, right side: Secondary | ICD-10-CM | POA: Diagnosis not present

## 2021-01-13 DIAGNOSIS — R262 Difficulty in walking, not elsewhere classified: Secondary | ICD-10-CM
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
## 2021-01-13 NOTE — Progress Notes (Signed)
X-ray of the sacrum and coccyx shows no fractures.  It looks okay to radiology.

## 2021-01-13 NOTE — Patient Instructions (Signed)
Access Code: [email or web address] URL: [email or web address] Date: 01/13/2021 Prepared by: Vista Mink  Exercises Single Knee to Chest Stretch - 2-3 x daily - 7 x weekly - 1 sets - 5 reps - 20 seconds hold Supine Figure 4 Piriformis Stretch - 2-3 x daily - 7 x weekly - 1 sets - 5 reps - 20 seconds hold Standing Scapular Retraction - 5 x daily - 7 x weekly - 1 sets - 5 reps - 5 second hold Standing Lumbar Extension at [place] 5 x daily - 7 x weekly - 1 sets - 5 reps - 3 seconds hold

## 2021-01-20 ENCOUNTER — Ambulatory Visit (INDEPENDENT_AMBULATORY_CARE_PROVIDER_SITE_OTHER): Payer: 59 | Admitting: Physical Therapy

## 2021-01-20 ENCOUNTER — Other Ambulatory Visit: Payer: Self-pay

## 2021-01-20 DIAGNOSIS — M6281 Muscle weakness (generalized): Secondary | ICD-10-CM

## 2021-01-20 DIAGNOSIS — M5441 Lumbago with sciatica, right side: Secondary | ICD-10-CM

## 2021-01-20 DIAGNOSIS — R293 Abnormal posture: Secondary | ICD-10-CM | POA: Diagnosis not present

## 2021-01-20 DIAGNOSIS — R262 Difficulty in walking, not elsewhere classified: Secondary | ICD-10-CM | POA: Diagnosis not present

## 2021-01-20 DIAGNOSIS — G8929 Other chronic pain: Secondary | ICD-10-CM

## 2021-01-20 NOTE — Therapy (Signed)
[facility] [address] [place], [place], [address] Phone: [phone]   Fax:  [phone]  Physical Therapy Treatment  Patient Details  Name: Ebony Allen MRN: [id number] Date of Birth: 1971-01-02 Referring Provider (PT): Gregor Hams MD   Encounter Date: 01/20/2021   PT End of Session - 01/20/21 1342    Visit Number 2    Number of Visits 16    Date for PT Re-Evaluation 03/10/21    PT Start Time 1300    PT Stop Time 1342    PT Time Calculation (min) 42 min    Activity Tolerance Patient tolerated treatment well;No increased pain    Behavior During Therapy WFL for tasks assessed/performed           Past Medical History:  Diagnosis Date  . Anemia   . GERD (gastroesophageal reflux disease)     Past Surgical History:  Procedure Laterality Date  . DILATATION & CURETTAGE/HYSTEROSCOPY WITH MYOSURE N/A 11/17/2014   Procedure: DILATATION & CURETTAGE/HYSTEROSCOPY WITH MYOSURE;  Surgeon: Delsa Bern, MD;  Location: [facility];  Service: Gynecology;  Laterality: N/A;  . DILITATION & CURRETTAGE/HYSTROSCOPY WITH VERSAPOINT RESECTION N/A 05/20/2015   Procedure: DILATATION & CURETTAGE/HYSTEROSCOPY WITH VERSAPOINT RESECTION;  Surgeon: Delsa Bern, MD;  Location: [facility];  Service: Gynecology;  Laterality: N/A;  . esophageal dilation     Dr Ronalee Red  . ROBOT ASSISTED MYOMECTOMY N/A 11/17/2014   Procedure: ROBOTIC ASSISTED MYOMECTOMY;  Surgeon: Delsa Bern, MD;  Location: [facility];  Service: Gynecology;  Laterality: N/A;    There were no vitals filed for this visit.   Subjective Assessment - 01/20/21 1318    Subjective She relays overall 4-5 low back pain today out of 10. She has tried the exercises and likes them, finds them to be helpful.    Limitations Sitting;Lifting;Standing;Walking    How long can you sit comfortably? < 30 minutes    How long can you stand comfortably? < 15 minutes    How long can you walk comfortably? Not walking for exercise like  she previously did    Patient Stated Goals Get rid of back pain and return to previous active lifestyle    Pain Onset More than a month ago                             [facility] Adult PT Treatment/Exercise - 01/20/21 0001      Lumbar Exercises: Stretches   Single Knee to Chest Stretch Right;Left;2 reps;30 seconds    Double Knee to Chest Stretch 2 reps;30 seconds    Lower Trunk Rotation 10 seconds;5 reps    Figure 4 Stretch 2 reps;30 seconds   bilat     Lumbar Exercises: Aerobic   Recumbent Bike L2 X8 min      Lumbar Exercises: Standing   Row Both;20 reps    Theraband Level (Row) Level 2 (Red)    Shoulder Extension Both;20 reps    Theraband Level (Shoulder Extension) Level 2 (Red)      Lumbar Exercises: Supine   Bridge 10 reps;5 seconds    Bridge Limitations 2 sets      Lumbar Exercises: Quadruped   Single Arm Raise Right;Left;10 reps    Straight Leg Raise 10 reps    Other Quadruped Lumbar Exercises cat-camel 5 sec X10 ea    Other Quadruped Lumbar Exercises child pose stretch 30 sec X3      Manual Therapy   Manual therapy  comments long axis distraction performed on both sides 60 sec bouts X2 ea, Then moved to prone for central PA mobs L5 to L1 30 sec bouts X2 ea segment grade 1-2                    PT Short Term Goals - 01/13/21 1626      PT SHORT TERM GOAL #1   Title Ebony Allen will be independent with her starter HEP.    Time 4    Period Weeks    Status New    Target Date 02/10/21             PT Long Term Goals - 01/13/21 1626      PT LONG TERM GOAL #1   Title Improve FOTO score to 66.    Baseline 54    Time 8    Period Weeks    Status New    Target Date 03/10/21      PT LONG TERM GOAL #2   Title Ebony Allen will report low back pain consistently 0-2/10 with no complaints of R LE pain.    Baseline Can be 5+/10    Time 8    Period Weeks    Status New    Target Date 03/10/21      PT LONG TERM GOAL #3   Title Improve B hip flexors  flexibility to 110 and hip ER AROM to 40 degrees.    Baseline 100/95 and 25/22 L/R, respectively    Time 8    Period Weeks    Status New    Target Date 03/10/21      PT LONG TERM GOAL #4   Title Improve trunk extension AROM to 20 degrees.    Baseline 10    Time 8    Period Weeks    Status New    Target Date 03/10/21      PT LONG TERM GOAL #5   Title Improve core strength as assessed by objective testing and FOTO.    Time 8    Period Weeks    Status New    Target Date 03/10/21      Additional Long Term Goals   Additional Long Term Goals Yes      PT LONG TERM GOAL #6   Title Ebony Allen will be independent with her long-term HEP at DC.    Time 8    Period Weeks    Status New    Target Date 03/10/21                 Plan - 01/20/21 1343    Clinical Impression Statement We reviewd HEP and she shows good understanding and technique with this. We then added additional stretching, and core stabilization work today with good tolerance and she relays she feels overall better after session. Continue plan of care.    Examination-Activity Limitations Sit;Transfers;Sleep;Bed Mobility;Bend;Lift;Locomotion Level;Carry;Stand    Examination-Participation Restrictions Interpersonal Relationship;Occupation;Community Activity    Stability/Clinical Decision Making Stable/Uncomplicated    Rehab Potential Good    PT Frequency 2x / week    PT Duration 8 weeks    PT Treatment/Interventions ADLs/Self Care Home Management;Traction;Cryotherapy;Therapeutic exercise;Electrical Stimulation;Therapeutic activities;Neuromuscular re-education;Patient/family education;Manual techniques;Dry needling    PT Next Visit Plan progress postural and spine education, progress low back and B hip/leg strength.    PT Home Exercise Plan Access Code: [email or web address]    Consulted and Agree with Plan of Care Patient  Patient will benefit from skilled therapeutic intervention in order to improve the following  deficits and impairments:  Abnormal gait,Decreased activity tolerance,Decreased endurance,Decreased range of motion,Decreased strength,Difficulty walking,Impaired flexibility,Increased muscle spasms,Increased edema,Postural dysfunction,Improper body mechanics,Pain  Visit Diagnosis: Abnormal posture  Difficulty walking  Muscle weakness (generalized)  Chronic bilateral low back pain with right-sided sciatica     Problem List Patient Active Problem List   Diagnosis Date Noted  . Right lumbar radiculopathy 07/15/2020  . Headache 07/15/2020  . Bilateral shoulder pain 07/15/2020  . Upper back pain 07/15/2020  . Chest pain 07/15/2020  . Hypermobility syndrome 01/03/2017  . Nonallopathic lesion of thoracic region 01/03/2017  . Nonallopathic lesion of lumbosacral region 01/03/2017  . Nonallopathic lesion of sacral region 01/03/2017  . Low back pain with sciatica 09/11/2016  . Routine general medical examination at a health care facility 01/11/2016  . GERD (gastroesophageal reflux disease) 03/09/2014    Silvestre Mesi 01/20/2021, 1:44 PM  [facility] Physical Therapy [address] [place], [place], [address] Phone: [phone]   Fax:  [phone]  Name: Ebony Allen MRN: [id number] Date of Birth: Jan 28, 1971

## 2021-01-21 ENCOUNTER — Encounter: Payer: 59 | Admitting: Physical Therapy

## 2021-01-24 ENCOUNTER — Encounter: Payer: Self-pay | Admitting: Physical Therapy

## 2021-01-24 ENCOUNTER — Other Ambulatory Visit: Payer: Self-pay

## 2021-01-24 ENCOUNTER — Ambulatory Visit (INDEPENDENT_AMBULATORY_CARE_PROVIDER_SITE_OTHER): Payer: 59 | Admitting: Physical Therapy

## 2021-01-24 DIAGNOSIS — R262 Difficulty in walking, not elsewhere classified: Secondary | ICD-10-CM

## 2021-01-24 DIAGNOSIS — M5441 Lumbago with sciatica, right side: Secondary | ICD-10-CM

## 2021-01-24 DIAGNOSIS — R293 Abnormal posture: Secondary | ICD-10-CM

## 2021-01-24 DIAGNOSIS — G8929 Other chronic pain: Secondary | ICD-10-CM

## 2021-01-24 DIAGNOSIS — M6281 Muscle weakness (generalized): Secondary | ICD-10-CM | POA: Diagnosis not present

## 2021-01-24 NOTE — Therapy (Signed)
[facility] Physical Therapy [address] [place], [place], [address] Phone: [phone]   Fax:  [phone]  Physical Therapy Treatment  Patient Details  Name: Ebony Allen MRN: [id number] Date of Birth: Nov 01, 1970 Referring Provider (PT): Gregor Hams MD   Encounter Date: 01/24/2021   PT End of Session - 01/24/21 1129    Visit Number 3    Number of Visits 16    Date for PT Re-Evaluation 03/10/21    PT Start Time 0930    PT Stop Time 1012    PT Time Calculation (min) 42 min    Activity Tolerance Patient tolerated treatment well;No increased pain    Behavior During Therapy WFL for tasks assessed/performed           Past Medical History:  Diagnosis Date  . Anemia   . GERD (gastroesophageal reflux disease)     Past Surgical History:  Procedure Laterality Date  . DILATATION & CURETTAGE/HYSTEROSCOPY WITH MYOSURE N/A 11/17/2014   Procedure: DILATATION & CURETTAGE/HYSTEROSCOPY WITH MYOSURE;  Surgeon: Delsa Bern, MD;  Location: [facility];  Service: Gynecology;  Laterality: N/A;  . DILITATION & CURRETTAGE/HYSTROSCOPY WITH VERSAPOINT RESECTION N/A 05/20/2015   Procedure: DILATATION & CURETTAGE/HYSTEROSCOPY WITH VERSAPOINT RESECTION;  Surgeon: Delsa Bern, MD;  Location: [facility];  Service: Gynecology;  Laterality: N/A;  . esophageal dilation     Dr Ronalee Red  . ROBOT ASSISTED MYOMECTOMY N/A 11/17/2014   Procedure: ROBOTIC ASSISTED MYOMECTOMY;  Surgeon: Delsa Bern, MD;  Location: [facility];  Service: Gynecology;  Laterality: N/A;    There were no vitals filed for this visit.   Subjective Assessment - 01/24/21 0933    Subjective fell getting out of bed over the weekend and hand was swollen and painful -improving overall.    Limitations Sitting;Lifting;Standing;Walking    How long can you sit comfortably? < 30 minutes    How long can you stand comfortably? < 15 minutes    How long can you walk comfortably? Not walking for exercise like she previously did    Patient  Stated Goals Get rid of back pain and return to previous active lifestyle    Pain Score 6     Pain Location Back    Pain Orientation Lower    Pain Descriptors / Indicators Aching;Constant;Jabbing;Sore;Spasm;Tender;Tightness    Pain Type Chronic pain    Pain Onset More than a month ago    Pain Frequency Constant    Aggravating Factors  prolonged sitting, standing, walking    Pain Relieving Factors n/a                             [facility] Adult PT Treatment/Exercise - 01/24/21 0001      Self-Care   Self-Care Other Self-Care Comments    Other Self-Care Comments  educated on DN - pt requests to perform on a Friday to have weekend to assess response without work      Lumbar Exercises: Stretches   Single Knee to Chest Stretch Right;Left;2 reps;30 seconds    Double Knee to Chest Stretch 2 reps;30 seconds    Lower Trunk Rotation 10 seconds;5 reps    Figure 4 Stretch 2 reps;30 seconds   bilat     Lumbar Exercises: Aerobic   Recumbent Bike L3 x 8 min      Lumbar Exercises: Supine   Clam 20 reps   single limb L4 band   Bridge 10 reps;5 seconds    Bridge Limitations 2  sets                  PT Education - 01/24/21 1128    Education Details DN and use of tennis ball for self STM    Person(s) Educated Patient    Methods Explanation;Demonstration    Comprehension Verbalized understanding            PT Short Term Goals - 01/13/21 1626      PT SHORT TERM GOAL #1   Title Ebony Allen will be independent with her starter HEP.    Time 4    Period Weeks    Status New    Target Date 02/10/21             PT Long Term Goals - 01/13/21 1626      PT LONG TERM GOAL #1   Title Improve FOTO score to 66.    Baseline 54    Time 8    Period Weeks    Status New    Target Date 03/10/21      PT LONG TERM GOAL #2   Title Ebony Allen will report low back pain consistently 0-2/10 with no complaints of R LE pain.    Baseline Can be 5+/10    Time 8    Period Weeks    Status  New    Target Date 03/10/21      PT LONG TERM GOAL #3   Title Improve B hip flexors flexibility to 110 and hip ER AROM to 40 degrees.    Baseline 100/95 and 25/22 L/R, respectively    Time 8    Period Weeks    Status New    Target Date 03/10/21      PT LONG TERM GOAL #4   Title Improve trunk extension AROM to 20 degrees.    Baseline 10    Time 8    Period Weeks    Status New    Target Date 03/10/21      PT LONG TERM GOAL #5   Title Improve core strength as assessed by objective testing and FOTO.    Time 8    Period Weeks    Status New    Target Date 03/10/21      Additional Long Term Goals   Additional Long Term Goals Yes      PT LONG TERM GOAL #6   Title Ebony Allen will be independent with her long-term HEP at [place].    Time 8    Period Weeks    Status New    Target Date 03/10/21                 Plan - 01/24/21 1129    Clinical Impression Statement Pt tolerated session well today, deferred quadruped activities due to recent wrist injury that is improving.  Discussed DN today and feel she would benefit but wants to try on a Friday to allow the weekend to recover should she need it.  Will continue to benefit from PT to maximize function.    Examination-Activity Limitations Sit;Transfers;Sleep;Bed Mobility;Bend;Lift;Locomotion Level;Carry;Stand    Examination-Participation Restrictions Interpersonal Relationship;Occupation;Community Activity    Stability/Clinical Decision Making Stable/Uncomplicated    Rehab Potential Good    PT Frequency 2x / week    PT Duration 8 weeks    PT Treatment/Interventions ADLs/Self Care Home Management;Traction;Cryotherapy;Therapeutic exercise;Electrical Stimulation;Therapeutic activities;Neuromuscular re-education;Patient/family education;Manual techniques;Dry needling    PT Next Visit Plan progress postural and spine education, progress low back and B hip/leg strength.; DN to  lumbar paraspinals and multifidi, possbily QL as well    PT Home  Exercise Plan Access Code: [email or web address]    Consulted and Agree with Plan of Care Patient           Patient will benefit from skilled therapeutic intervention in order to improve the following deficits and impairments:  Abnormal gait,Decreased activity tolerance,Decreased endurance,Decreased range of motion,Decreased strength,Difficulty walking,Impaired flexibility,Increased muscle spasms,Increased edema,Postural dysfunction,Improper body mechanics,Pain  Visit Diagnosis: Abnormal posture  Difficulty walking  Muscle weakness (generalized)  Chronic bilateral low back pain with right-sided sciatica     Problem List Patient Active Problem List   Diagnosis Date Noted  . Right lumbar radiculopathy 07/15/2020  . Headache 07/15/2020  . Bilateral shoulder pain 07/15/2020  . Upper back pain 07/15/2020  . Chest pain 07/15/2020  . Hypermobility syndrome 01/03/2017  . Nonallopathic lesion of thoracic region 01/03/2017  . Nonallopathic lesion of lumbosacral region 01/03/2017  . Nonallopathic lesion of sacral region 01/03/2017  . Low back pain with sciatica 09/11/2016  . Routine general medical examination at a health care facility 01/11/2016  . GERD (gastroesophageal reflux disease) 03/09/2014      Laureen Abrahams, PT, DPT 01/24/21 11:31 AM    [facility] Physical Therapy [address] [place], [place], [address] Phone: [phone]   Fax:  [phone]  Name: Ebony Allen MRN: [id number] Date of Birth: [birth year]

## 2021-01-28 ENCOUNTER — Encounter: Payer: Self-pay | Admitting: Physical Therapy

## 2021-01-28 ENCOUNTER — Other Ambulatory Visit: Payer: Self-pay

## 2021-01-28 ENCOUNTER — Ambulatory Visit (INDEPENDENT_AMBULATORY_CARE_PROVIDER_SITE_OTHER): Payer: 59 | Admitting: Physical Therapy

## 2021-01-28 DIAGNOSIS — R262 Difficulty in walking, not elsewhere classified: Secondary | ICD-10-CM

## 2021-01-28 DIAGNOSIS — G8929 Other chronic pain: Secondary | ICD-10-CM

## 2021-01-28 DIAGNOSIS — M5441 Lumbago with sciatica, right side: Secondary | ICD-10-CM

## 2021-01-28 DIAGNOSIS — R293 Abnormal posture: Secondary | ICD-10-CM

## 2021-01-28 DIAGNOSIS — M6281 Muscle weakness (generalized): Secondary | ICD-10-CM

## 2021-01-28 NOTE — Therapy (Signed)
[facility] Physical Therapy [address] [place], [place], [address] Phone: [phone]   Fax:  [phone]  Physical Therapy Treatment  Patient Details  Name: Ebony Allen MRN: [id number] Date of Birth: [birth year] Referring Provider (Ebony Allen): Gregor Hams MD   Encounter Date: 01/28/2021   Ebony Allen End of Session - 01/28/21 1048    Visit Number 4    Number of Visits 16    Date for Ebony Allen Re-Evaluation 03/10/21    Ebony Allen Start Time 4010    Ebony Allen Stop Time 2725   Ebony Allen had to leave for 11:00 meeting   Ebony Allen Time Calculation (min) 31 min    Activity Tolerance Patient tolerated treatment well;No increased pain    Behavior During Therapy WFL for tasks assessed/performed           Past Medical History:  Diagnosis Date  . Anemia   . GERD (gastroesophageal reflux disease)     Past Surgical History:  Procedure Laterality Date  . DILATATION & CURETTAGE/HYSTEROSCOPY WITH MYOSURE N/A 11/17/2014   Procedure: DILATATION & CURETTAGE/HYSTEROSCOPY WITH MYOSURE;  Surgeon: Delsa Bern, MD;  Location: [facility];  Service: Gynecology;  Laterality: N/A;  . DILITATION & CURRETTAGE/HYSTROSCOPY WITH VERSAPOINT RESECTION N/A 05/20/2015   Procedure: DILATATION & CURETTAGE/HYSTEROSCOPY WITH VERSAPOINT RESECTION;  Surgeon: Delsa Bern, MD;  Location: [facility];  Service: Gynecology;  Laterality: N/A;  . esophageal dilation     Dr Ronalee Red  . ROBOT ASSISTED MYOMECTOMY N/A 11/17/2014   Procedure: ROBOTIC ASSISTED MYOMECTOMY;  Surgeon: Delsa Bern, MD;  Location: [facility];  Service: Gynecology;  Laterality: N/A;    There were no vitals filed for this visit.   Subjective Assessment - 01/28/21 1014    Subjective has a meeting at 69 today, needs to leave a little early.    Limitations Sitting;Lifting;Standing;Walking    How long can you sit comfortably? < 30 minutes    How long can you stand comfortably? < 15 minutes    How long can you walk comfortably? Not walking for exercise like she previously did    Patient  Stated Goals Get rid of back pain and return to previous active lifestyle    Currently in Pain? Yes    Pain Score 6     Pain Location Back    Pain Orientation Lower    Pain Descriptors / Indicators Aching;Constant;Jabbing;Nagging    Pain Type Chronic pain    Pain Onset More than a month ago    Pain Frequency Constant    Aggravating Factors  prolonged sitting, standing, walking    Pain Relieving Factors n/a                             [facility] Adult Ebony Allen Treatment/Exercise - 01/28/21 1012      Lumbar Exercises: Stretches   Single Knee to Chest Stretch Right;Left;5 reps;20 seconds    Double Knee to Chest Stretch 3 reps;20 seconds    Figure 4 Stretch 3 reps;20 seconds    Figure 4 Stretch Limitations knee to opposite shoulder    Other Lumbar Stretch Exercise prone press up and childs pose after DN/manual with reduced pain and symptoms      Lumbar Exercises: Aerobic   Recumbent Bike L4 x 8 min      Lumbar Exercises: Supine   Bridge 10 reps;5 seconds      Manual Therapy   Manual therapy comments STM and compression to lumbar paraspinals; G1-2 CPA mobs L4-5  Trigger Point Dry Needling - 01/28/21 1034    Consent Given? Yes    Education Handout Provided Previously provided    Muscles Treated Back/Hip Lumbar multifidi    Lumbar multifidi Response Twitch response elicited                  Ebony Allen Short Term Goals - 01/13/21 1626      Ebony Allen SHORT TERM GOAL #1   Title Lucilia will be independent with her starter HEP.    Time 4    Period Weeks    Status New    Target Date 02/10/21             Ebony Allen Long Term Goals - 01/13/21 1626      Ebony Allen LONG TERM GOAL #1   Title Improve FOTO score to 66.    Baseline 54    Time 8    Period Weeks    Status New    Target Date 03/10/21      Ebony Allen LONG TERM GOAL #2   Title Alixis will report low back pain consistently 0-2/10 with no complaints of R LE pain.    Baseline Can be 5+/10    Time 8    Period Weeks     Status New    Target Date 03/10/21      Ebony Allen LONG TERM GOAL #3   Title Improve B hip flexors flexibility to 110 and hip ER AROM to 40 degrees.    Baseline 100/95 and 25/22 L/R, respectively    Time 8    Period Weeks    Status New    Target Date 03/10/21      Ebony Allen LONG TERM GOAL #4   Title Improve trunk extension AROM to 20 degrees.    Baseline 10    Time 8    Period Weeks    Status New    Target Date 03/10/21      Ebony Allen LONG TERM GOAL #5   Title Improve core strength as assessed by objective testing and FOTO.    Time 8    Period Weeks    Status New    Target Date 03/10/21      Additional Long Term Goals   Additional Long Term Goals Yes      Ebony Allen LONG TERM GOAL #6   Title Olivea will be independent with her long-term HEP at DC.    Time 8    Period Weeks    Status New    Target Date 03/10/21                 Plan - 01/28/21 1048    Clinical Impression Statement Ebony Allen with positive response to DN today reporting decreased pain and stiffness immediately following.  Symptoms appear to be centralizing at this time and is progressing well with Ebony Allen.  Will continue to benefit from Ebony Allen to maximize function.    Examination-Activity Limitations Sit;Transfers;Sleep;Bed Mobility;Bend;Lift;Locomotion Level;Carry;Stand    Examination-Participation Restrictions Interpersonal Relationship;Occupation;Community Activity    Stability/Clinical Decision Making Stable/Uncomplicated    Rehab Potential Good    Ebony Allen Frequency 2x / week    Ebony Allen Duration 8 weeks    Ebony Allen Treatment/Interventions ADLs/Self Care Home Management;Traction;Cryotherapy;Therapeutic exercise;Electrical Stimulation;Therapeutic activities;Neuromuscular re-education;Patient/family education;Manual techniques;Dry needling    Ebony Allen Next Visit Plan progress postural and spine education, progress low back and B hip/leg strength.; assess response to DN (only did multifidi; may benefit from paraspinals and QL)    Ebony Allen Home Exercise Plan Access Code:  [email or web address]  Consulted and Agree with Plan of Care Patient           Patient will benefit from skilled therapeutic intervention in order to improve the following deficits and impairments:  Abnormal gait,Decreased activity tolerance,Decreased endurance,Decreased range of motion,Decreased strength,Difficulty walking,Impaired flexibility,Increased muscle spasms,Increased edema,Postural dysfunction,Improper body mechanics,Pain  Visit Diagnosis: Abnormal posture  Difficulty walking  Muscle weakness (generalized)  Chronic bilateral low back pain with right-sided sciatica     Problem List Patient Active Problem List   Diagnosis Date Noted  . Right lumbar radiculopathy 07/15/2020  . Headache 07/15/2020  . Bilateral shoulder pain 07/15/2020  . Upper back pain 07/15/2020  . Chest pain 07/15/2020  . Hypermobility syndrome 01/03/2017  . Nonallopathic lesion of thoracic region 01/03/2017  . Nonallopathic lesion of lumbosacral region 01/03/2017  . Nonallopathic lesion of sacral region 01/03/2017  . Low back pain with sciatica 09/11/2016  . Routine general medical examination at a health care facility 01/11/2016  . GERD (gastroesophageal reflux disease) 03/09/2014     Ebony Allen, Ebony Allen, Ebony Allen 01/28/21 10:51 AM    [facility] Physical Therapy [address] [place], [place], [address] Phone: [phone]   Fax:  [phone]  Name: Ebony Allen MRN: [id number] Date of Birth: [birth year]

## 2021-02-01 ENCOUNTER — Other Ambulatory Visit: Payer: Self-pay

## 2021-02-01 ENCOUNTER — Ambulatory Visit (INDEPENDENT_AMBULATORY_CARE_PROVIDER_SITE_OTHER): Payer: 59 | Admitting: Rehabilitative and Restorative Service Providers"

## 2021-02-01 ENCOUNTER — Encounter: Payer: Self-pay | Admitting: Rehabilitative and Restorative Service Providers"

## 2021-02-01 DIAGNOSIS — M6281 Muscle weakness (generalized): Secondary | ICD-10-CM | POA: Diagnosis not present

## 2021-02-01 DIAGNOSIS — G8929 Other chronic pain: Secondary | ICD-10-CM

## 2021-02-01 DIAGNOSIS — M5441 Lumbago with sciatica, right side: Secondary | ICD-10-CM | POA: Diagnosis not present

## 2021-02-01 DIAGNOSIS — R293 Abnormal posture: Secondary | ICD-10-CM

## 2021-02-01 DIAGNOSIS — R262 Difficulty in walking, not elsewhere classified: Secondary | ICD-10-CM | POA: Diagnosis not present

## 2021-02-01 NOTE — Therapy (Signed)
[facility] [address] [place], [place], [address] Phone: [phone]   Fax:  [phone]  Physical Therapy Treatment  Patient Details  Name: Ebony Allen MRN: [id number] Date of Birth: 18-Jul-1971 Referring Provider (PT): Gregor Hams MD   Encounter Date: 02/01/2021   PT End of Session - 02/01/21 1633    Visit Number 5    Number of Visits 16    Date for PT Re-Evaluation 03/10/21    PT Start Time 2505    PT Stop Time 1230    PT Time Calculation (min) 39 min    Activity Tolerance Patient tolerated treatment well;No increased pain    Behavior During Therapy WFL for tasks assessed/performed           Past Medical History:  Diagnosis Date  . Anemia   . GERD (gastroesophageal reflux disease)     Past Surgical History:  Procedure Laterality Date  . DILATATION & CURETTAGE/HYSTEROSCOPY WITH MYOSURE N/A 11/17/2014   Procedure: DILATATION & CURETTAGE/HYSTEROSCOPY WITH MYOSURE;  Surgeon: Delsa Bern, MD;  Location: [facility];  Service: Gynecology;  Laterality: N/A;  . DILITATION & CURRETTAGE/HYSTROSCOPY WITH VERSAPOINT RESECTION N/A 05/20/2015   Procedure: DILATATION & CURETTAGE/HYSTEROSCOPY WITH VERSAPOINT RESECTION;  Surgeon: Delsa Bern, MD;  Location: [facility];  Service: Gynecology;  Laterality: N/A;  . esophageal dilation     Dr Ronalee Red  . ROBOT ASSISTED MYOMECTOMY N/A 11/17/2014   Procedure: ROBOTIC ASSISTED MYOMECTOMY;  Surgeon: Delsa Bern, MD;  Location: [facility];  Service: Gynecology;  Laterality: N/A;    There were no vitals filed for this visit.   Subjective Assessment - 02/01/21 1205    Subjective Ebony Allen reports good compliance with her HEP.  No symptoms below mid-thigh this week so far.    Limitations Sitting;Lifting;Standing;Walking    How long can you sit comfortably? < 30 minutes    How long can you stand comfortably? < 15 minutes    How long can you walk comfortably? Not walking for exercise like she previously did    Patient  Stated Goals Get rid of back pain and return to previous active lifestyle    Currently in Pain? Yes    Pain Score 5     Pain Location Back    Pain Orientation Lower    Pain Descriptors / Indicators Aching;Constant;Nagging    Pain Type Chronic pain    Pain Radiating Towards Upper R posterior thigh    Pain Onset More than a month ago    Pain Frequency Constant    Aggravating Factors  Prolonged postures and walking    Pain Relieving Factors NA    Effect of Pain on Daily Activities Unable to participate in normal activities    Multiple Pain Sites No                             [facility] Adult PT Treatment/Exercise - 02/01/21 0001      Posture/Postural Control   Posture/Postural Control Postural limitations    Postural Limitations Forward head;Rounded Shoulders;Decreased lumbar lordosis      Therapeutic Activites    Therapeutic Activities ADL's;Lifting    ADL's Log roll    Lifting Golfers and diagonal squat lifts      Exercises   Exercises Lumbar      Lumbar Exercises: Stretches   Single Knee to Chest Stretch Left;Right;4 reps;20 seconds    Figure 4 Stretch 4 reps;20 seconds      Lumbar Exercises:  Aerobic   Tread Mill 8 minutes at 1.8 MPH while reviewing new exercises      Lumbar Exercises: Standing   Shoulder ADduction Strengthening;Both;10 reps;Limitations    Shoulder Adduction Limitations 5 seconds shoulder blade pinch    Other Standing Lumbar Exercises Trunk extension AROM 10X 3 seconds    Other Standing Lumbar Exercises Hip hike in doorframe 2 sets of 10 for 3 seconds      Lumbar Exercises: Prone   Straight Leg Raise 10 reps;5 seconds    Opposite Arm/Leg Raise Right arm/Left leg;Left arm/Right leg;10 reps;3 seconds;Other (comment)    Opposite Arm/Leg Raise Limitations 2 sets                  PT Education - 02/01/21 1631    Education Details Spent a lot of time on practical body mechanics and new exercises.    Person(s) Educated Patient     Methods Demonstration;Explanation;Verbal cues;Handout    Comprehension Verbalized understanding;Returned demonstration;Need further instruction;Verbal cues required            PT Short Term Goals - 02/01/21 1632      PT SHORT TERM GOAL #1   Title Ebony Allen will be independent with her starter HEP.    Time 4    Period Weeks    Status Achieved    Target Date 02/10/21             PT Long Term Goals - 02/01/21 1632      PT LONG TERM GOAL #1   Title Improve FOTO score to 66.    Baseline 54    Time 8    Period Weeks    Status On-going      PT LONG TERM GOAL #2   Title Ebony Allen will report low back pain consistently 0-2/10 with no complaints of R LE pain.    Baseline Can be 5+/10    Time 8    Period Weeks    Status On-going      PT LONG TERM GOAL #3   Title Improve B hip flexors flexibility to 110 and hip ER AROM to 40 degrees.    Baseline 100/95 and 25/22 L/R, respectively    Time 8    Period Weeks    Status On-going      PT LONG TERM GOAL #4   Title Improve trunk extension AROM to 20 degrees.    Baseline 10    Time 8    Period Weeks    Status On-going      PT LONG TERM GOAL #5   Title Improve core strength as assessed by objective testing and FOTO.    Time 8    Period Weeks    Status On-going      PT LONG TERM GOAL #6   Title Ebony Allen will be independent with her long-term HEP at DC.    Time 8    Period Weeks    Status On-going                 Plan - 02/01/21 1633    Clinical Impression Statement Ebony Allen reports early progress with her physical therapy.  Sciatic symptoms are more proximal.  She will benefit from continued body mechanics and strength work as both were challenging today.  Her prognosis remains good with continued work.    Examination-Activity Limitations Sit;Transfers;Sleep;Bed Mobility;Bend;Lift;Locomotion Level;Carry;Stand    Examination-Participation Restrictions Interpersonal Relationship;Occupation;Community Activity    Stability/Clinical  Decision Making Stable/Uncomplicated    Rehab Potential Good  PT Frequency 2x / week    PT Duration 8 weeks    PT Treatment/Interventions ADLs/Self Care Home Management;Traction;Cryotherapy;Therapeutic exercise;Electrical Stimulation;Therapeutic activities;Neuromuscular re-education;Patient/family education;Manual techniques;Dry needling    PT Next Visit Plan progress postural and spine education, progress low back and B hip/leg strength.; assess response to DN (only did multifidi; may benefit from paraspinals and QL)    PT Home Exercise Plan Access Code: [email or web address]    Consulted and Agree with Plan of Care Patient           Patient will benefit from skilled therapeutic intervention in order to improve the following deficits and impairments:  Abnormal gait,Decreased activity tolerance,Decreased endurance,Decreased range of motion,Decreased strength,Difficulty walking,Impaired flexibility,Increased muscle spasms,Increased edema,Postural dysfunction,Improper body mechanics,Pain  Visit Diagnosis: Abnormal posture  Difficulty walking  Muscle weakness (generalized)  Chronic bilateral low back pain with right-sided sciatica     Problem List Patient Active Problem List   Diagnosis Date Noted  . Right lumbar radiculopathy 07/15/2020  . Headache 07/15/2020  . Bilateral shoulder pain 07/15/2020  . Upper back pain 07/15/2020  . Chest pain 07/15/2020  . Hypermobility syndrome 01/03/2017  . Nonallopathic lesion of thoracic region 01/03/2017  . Nonallopathic lesion of lumbosacral region 01/03/2017  . Nonallopathic lesion of sacral region 01/03/2017  . Low back pain with sciatica 09/11/2016  . Routine general medical examination at a health care facility 01/11/2016  . GERD (gastroesophageal reflux disease) 03/09/2014    Farley Ly PT, MPT 02/01/2021, 4:35 PM  [facility] [address] [place], [place], [address] Phone: [phone]   Fax:   [phone]  Name: Swati Granberry MRN: [id number] Date of Birth: 1971/06/04

## 2021-02-04 ENCOUNTER — Ambulatory Visit (INDEPENDENT_AMBULATORY_CARE_PROVIDER_SITE_OTHER): Payer: 59 | Admitting: Rehabilitative and Restorative Service Providers"

## 2021-02-04 ENCOUNTER — Other Ambulatory Visit: Payer: Self-pay

## 2021-02-04 ENCOUNTER — Encounter: Payer: Self-pay | Admitting: Rehabilitative and Restorative Service Providers"

## 2021-02-04 DIAGNOSIS — R293 Abnormal posture: Secondary | ICD-10-CM

## 2021-02-04 DIAGNOSIS — M6281 Muscle weakness (generalized): Secondary | ICD-10-CM | POA: Diagnosis not present

## 2021-02-04 DIAGNOSIS — R262 Difficulty in walking, not elsewhere classified: Secondary | ICD-10-CM

## 2021-02-04 DIAGNOSIS — M5441 Lumbago with sciatica, right side: Secondary | ICD-10-CM

## 2021-02-04 DIAGNOSIS — G8929 Other chronic pain: Secondary | ICD-10-CM

## 2021-02-04 NOTE — Therapy (Signed)
[facility] [address] [place], [place], [address] Phone: [phone]   Fax:  [phone]  Physical Therapy Treatment  Patient Details  Name: Ebony Allen MRN: [id number] Date of Birth: [birth year] Referring Provider (PT): Gregor Hams MD   Encounter Date: 02/04/2021   PT End of Session - 02/04/21 1302    Visit Number 6    Number of Visits 16    Date for PT Re-Evaluation 03/10/21    PT Start Time 1962    PT Stop Time 1237    PT Time Calculation (min) 48 min    Activity Tolerance Patient tolerated treatment well;No increased pain    Behavior During Therapy WFL for tasks assessed/performed           Past Medical History:  Diagnosis Date  . Anemia   . GERD (gastroesophageal reflux disease)     Past Surgical History:  Procedure Laterality Date  . DILATATION & CURETTAGE/HYSTEROSCOPY WITH MYOSURE N/A 11/17/2014   Procedure: DILATATION & CURETTAGE/HYSTEROSCOPY WITH MYOSURE;  Surgeon: Delsa Bern, MD;  Location: [facility];  Service: Gynecology;  Laterality: N/A;  . DILITATION & CURRETTAGE/HYSTROSCOPY WITH VERSAPOINT RESECTION N/A 05/20/2015   Procedure: DILATATION & CURETTAGE/HYSTEROSCOPY WITH VERSAPOINT RESECTION;  Surgeon: Delsa Bern, MD;  Location: [facility];  Service: Gynecology;  Laterality: N/A;  . esophageal dilation     Dr Ronalee Red  . ROBOT ASSISTED MYOMECTOMY N/A 11/17/2014   Procedure: ROBOTIC ASSISTED MYOMECTOMY;  Surgeon: Delsa Bern, MD;  Location: [facility];  Service: Gynecology;  Laterality: N/A;    There were no vitals filed for this visit.   Subjective Assessment - 02/04/21 1154    Subjective Kaytee reports good compliance with her HEP this week.  No symptoms below mid-gluteal this week so far.    Limitations Sitting;Lifting;Standing;Walking    How long can you sit comfortably? 1 hour (was < 30 minutes)    How long can you stand comfortably? 30 minutes (was < 15 minutes)    How long can you walk comfortably? Not walking for  exercise like she previously did    Patient Stated Goals Get rid of back pain and return to previous active lifestyle    Currently in Pain? Yes    Pain Score 4     Pain Location Back    Pain Orientation Lower    Pain Descriptors / Indicators Aching;Tightness    Pain Type Chronic pain    Pain Radiating Towards NA (was R posterior thigh)    Pain Onset More than a month ago    Pain Frequency Constant    Aggravating Factors  Prolonged postures, walking    Pain Relieving Factors Tylenol    Effect of Pain on Daily Activities Unable to participate in normal workouts or walks    Multiple Pain Sites No              [facility] PT Assessment - 02/04/21 0001      Observation/Other Assessments   Focus on Therapeutic Outcomes (FOTO)  57 (was 54, Goal 66)                         [facility] Adult PT Treatment/Exercise - 02/04/21 0001      Posture/Postural Control   Posture/Postural Control Postural limitations    Postural Limitations Forward head;Rounded Shoulders;Decreased lumbar lordosis      Therapeutic Activites    Therapeutic Activities ADL's;Lifting    ADL's Log roll    Lifting Golfers and diagonal squat  lifts      Exercises   Exercises Lumbar      Lumbar Exercises: Stretches   Single Knee to Chest Stretch Left;Right;4 reps;20 seconds    Figure 4 Stretch 4 reps;20 seconds      Lumbar Exercises: Standing   Shoulder ADduction Strengthening;Both;10 reps;Limitations    Shoulder Adduction Limitations 5 seconds shoulder blade pinches    Other Standing Lumbar Exercises Trunk extension AROM 10X 3 seconds    Other Standing Lumbar Exercises Hip hike in doorframe 10X for 3 seconds and hip abduction with pelvic stabilization 2 sets of 5 for 3 seconds      Lumbar Exercises: Prone   Straight Leg Raise 10 reps;5 seconds    Opposite Arm/Leg Raise Right arm/Left leg;Left arm/Right leg;10 reps;3 seconds;Other (comment)    Opposite Arm/Leg Raise Limitations 2 sets      Modalities    Modalities Cryotherapy      Cryotherapy   Number Minutes Cryotherapy 8 Minutes    Cryotherapy Location Lumbar Spine    Type of Cryotherapy Ice pack                  PT Education - 02/04/21 1233    Education Details Reviewed body mechanics and progressed core strengthening.    Person(s) Educated Patient    Methods Explanation;Verbal cues    Comprehension Verbalized understanding;Returned demonstration;Verbal cues required;Need further instruction            PT Short Term Goals - 02/04/21 1301      PT SHORT TERM GOAL #1   Title Korah will be independent with her starter HEP.    Time 4    Period Weeks    Status Achieved    Target Date 02/10/21             PT Long Term Goals - 02/04/21 1301      PT LONG TERM GOAL #1   Title Improve FOTO score to 66.    Baseline 57 on 02/04/2021 (was 53)    Time 8    Period Weeks    Status On-going      PT LONG TERM GOAL #2   Title Miarose will report low back pain consistently 0-2/10 with no complaints of R LE pain.    Baseline Can be 4/10 (was 5+/10)    Time 8    Period Weeks    Status On-going      PT LONG TERM GOAL #3   Title Improve B hip flexors flexibility to 110 and hip ER AROM to 40 degrees.    Baseline 100/95 and 25/22 L/R, respectively    Time 8    Period Weeks    Status On-going      PT LONG TERM GOAL #4   Title Improve trunk extension AROM to 20 degrees.    Baseline 10    Time 8    Period Weeks    Status On-going      PT LONG TERM GOAL #5   Title Improve core strength as assessed by objective testing and FOTO.    Time 8    Period Weeks    Status On-going      PT LONG TERM GOAL #6   Title Chardonay will be independent with her long-term HEP at [place].    Time 8    Period Weeks    Status On-going                 Plan - 02/04/21 1302  Clinical Impression Statement Tinesha notes her peripheral symptoms are moving central and are now mid-gluteal.  She is a bit tender today due to wearing heels at a  work function and I recommended flats for another function she must attend tomorrow.  Continue core strength and body mechanics/practical work to meet LTGs.    Examination-Activity Limitations Sit;Transfers;Sleep;Bed Mobility;Bend;Lift;Locomotion Level;Carry;Stand    Examination-Participation Restrictions Interpersonal Relationship;Occupation;Community Activity    Stability/Clinical Decision Making Stable/Uncomplicated    Rehab Potential Good    PT Frequency 2x / week    PT Duration 8 weeks    PT Treatment/Interventions ADLs/Self Care Home Management;Traction;Cryotherapy;Therapeutic exercise;Electrical Stimulation;Therapeutic activities;Neuromuscular re-education;Patient/family education;Manual techniques;Dry needling    PT Next Visit Plan progress postural and spine education, progress low back and B hip/leg strength.; assess response to DN (only did multifidi; may benefit from paraspinals and QL)    PT Home Exercise Plan Access Code: [email or web address]    Consulted and Agree with Plan of Care Patient           Patient will benefit from skilled therapeutic intervention in order to improve the following deficits and impairments:  Abnormal gait,Decreased activity tolerance,Decreased endurance,Decreased range of motion,Decreased strength,Difficulty walking,Impaired flexibility,Increased muscle spasms,Increased edema,Postural dysfunction,Improper body mechanics,Pain  Visit Diagnosis: Abnormal posture  Difficulty walking  Muscle weakness (generalized)  Chronic bilateral low back pain with right-sided sciatica     Problem List Patient Active Problem List   Diagnosis Date Noted  . Right lumbar radiculopathy 07/15/2020  . Headache 07/15/2020  . Bilateral shoulder pain 07/15/2020  . Upper back pain 07/15/2020  . Chest pain 07/15/2020  . Hypermobility syndrome 01/03/2017  . Nonallopathic lesion of thoracic region 01/03/2017  . Nonallopathic lesion of lumbosacral region 01/03/2017  .  Nonallopathic lesion of sacral region 01/03/2017  . Low back pain with sciatica 09/11/2016  . Routine general medical examination at a health care facility 01/11/2016  . GERD (gastroesophageal reflux disease) 03/09/2014    Farley Ly PT, MPT 02/04/2021, 1:05 PM  [facility] [address] [place], [place], [address] Phone: [phone]   Fax:  [phone]  Name: Yuri Flener MRN: [id number] Date of Birth: 09-16-1971

## 2021-02-08 ENCOUNTER — Other Ambulatory Visit: Payer: Self-pay

## 2021-02-08 ENCOUNTER — Ambulatory Visit (INDEPENDENT_AMBULATORY_CARE_PROVIDER_SITE_OTHER): Payer: 59 | Admitting: Physical Therapy

## 2021-02-08 ENCOUNTER — Encounter: Payer: Self-pay | Admitting: Physical Therapy

## 2021-02-08 DIAGNOSIS — M6281 Muscle weakness (generalized): Secondary | ICD-10-CM | POA: Diagnosis not present

## 2021-02-08 DIAGNOSIS — R262 Difficulty in walking, not elsewhere classified: Secondary | ICD-10-CM

## 2021-02-08 DIAGNOSIS — R293 Abnormal posture: Secondary | ICD-10-CM | POA: Diagnosis not present

## 2021-02-08 DIAGNOSIS — M5441 Lumbago with sciatica, right side: Secondary | ICD-10-CM | POA: Diagnosis not present

## 2021-02-08 DIAGNOSIS — G8929 Other chronic pain: Secondary | ICD-10-CM

## 2021-02-08 NOTE — Therapy (Signed)
[facility] [address] [place], [place], [address] Phone: [phone]   Fax:  [phone]  Physical Therapy Treatment  Patient Details  Name: Ebony Allen MRN: [id number] Date of Birth: 1971/06/21 Referring Provider (PT): Gregor Hams MD   Encounter Date: 02/08/2021   PT End of Session - 02/08/21 1242    Visit Number 7    Number of Visits 16    Date for PT Re-Evaluation 03/10/21    PT Start Time 1150    PT Stop Time 1229    PT Time Calculation (min) 39 min    Activity Tolerance Patient tolerated treatment well;No increased pain    Behavior During Therapy WFL for tasks assessed/performed           Past Medical History:  Diagnosis Date  . Anemia   . GERD (gastroesophageal reflux disease)     Past Surgical History:  Procedure Laterality Date  . DILATATION & CURETTAGE/HYSTEROSCOPY WITH MYOSURE N/A 11/17/2014   Procedure: DILATATION & CURETTAGE/HYSTEROSCOPY WITH MYOSURE;  Surgeon: Delsa Bern, MD;  Location: [facility];  Service: Gynecology;  Laterality: N/A;  . DILITATION & CURRETTAGE/HYSTROSCOPY WITH VERSAPOINT RESECTION N/A 05/20/2015   Procedure: DILATATION & CURETTAGE/HYSTEROSCOPY WITH VERSAPOINT RESECTION;  Surgeon: Delsa Bern, MD;  Location: [facility];  Service: Gynecology;  Laterality: N/A;  . esophageal dilation     Dr Ronalee Red  . ROBOT ASSISTED MYOMECTOMY N/A 11/17/2014   Procedure: ROBOTIC ASSISTED MYOMECTOMY;  Surgeon: Delsa Bern, MD;  Location: [facility];  Service: Gynecology;  Laterality: N/A;    There were no vitals filed for this visit.   Subjective Assessment - 02/08/21 1151    Subjective reports "just a little twinge" of pain in lower center of back    Limitations Sitting;Lifting;Standing;Walking    How long can you sit comfortably? 1 hour (was < 30 minutes)    How long can you stand comfortably? 30 minutes (was < 15 minutes)    How long can you walk comfortably? Not walking for exercise like she previously did    Patient  Stated Goals Get rid of back pain and return to previous active lifestyle    Currently in Pain? Yes    Pain Score 4     Pain Location Back    Pain Orientation Lower    Pain Descriptors / Indicators Aching;Tightness    Pain Type Chronic pain    Pain Onset More than a month ago    Pain Frequency Constant    Aggravating Factors  prolonged postures, walking    Pain Relieving Factors tylenol                             [facility] Adult PT Treatment/Exercise - 02/08/21 1153      Lumbar Exercises: Stretches   Single Knee to Chest Stretch Right;Left;3 reps;20 seconds    Standing Extension 10 reps;10 seconds    Standing Extension Limitations arms on wall      Lumbar Exercises: Aerobic   Recumbent Bike L4 x 8 min      Lumbar Exercises: Supine   Clam 10 reps   single limb L3 band   Bridge 10 reps;5 seconds    Bridge Limitations 2 sets    Isometric Hip Flexion 10 reps;5 seconds    Isometric Hip Flexion Limitations head/shoulders off mat    Other Supine Lumbar Exercises mini crunch oblique x 10 bil alt      Lumbar Exercises: Quadruped  Madcat/Old Horse 10 reps    Madcat/Old Horse Limitations 3 sec hold    Opposite Arm/Leg Raise Right arm/Left leg;Left arm/Right leg;10 reps;3 seconds                    PT Short Term Goals - 02/04/21 1301      PT SHORT TERM GOAL #1   Title Ebony Allen will be independent with her starter HEP.    Time 4    Period Weeks    Status Achieved    Target Date 02/10/21             PT Long Term Goals - 02/04/21 1301      PT LONG TERM GOAL #1   Title Improve FOTO score to 66.    Baseline 57 on 02/04/2021 (was 87)    Time 8    Period Weeks    Status On-going      PT LONG TERM GOAL #2   Title Ebony Allen will report low back pain consistently 0-2/10 with no complaints of R LE pain.    Baseline Can be 4/10 (was 5+/10)    Time 8    Period Weeks    Status On-going      PT LONG TERM GOAL #3   Title Improve B hip flexors flexibility to  110 and hip ER AROM to 40 degrees.    Baseline 100/95 and 25/22 L/R, respectively    Time 8    Period Weeks    Status On-going      PT LONG TERM GOAL #4   Title Improve trunk extension AROM to 20 degrees.    Baseline 10    Time 8    Period Weeks    Status On-going      PT LONG TERM GOAL #5   Title Improve core strength as assessed by objective testing and FOTO.    Time 8    Period Weeks    Status On-going      PT LONG TERM GOAL #6   Title Ebony Allen will be independent with her long-term HEP at [place].    Time 8    Period Weeks    Status On-going                 Plan - 02/08/21 1244    Clinical Impression Statement Pt feels symptoms are significantly improved at this time and has just a small amount of pain centralized to her low back today.  Worked on core strengthening today with positive response.  Anticipate she is nearing d/c but will discuss further at upcoming appts.    Examination-Activity Limitations Sit;Transfers;Sleep;Bed Mobility;Bend;Lift;Locomotion Level;Carry;Stand    Examination-Participation Restrictions Interpersonal Relationship;Occupation;Community Activity    Stability/Clinical Decision Making Stable/Uncomplicated    Rehab Potential Good    PT Frequency 2x / week    PT Duration 8 weeks    PT Treatment/Interventions ADLs/Self Care Home Management;Traction;Cryotherapy;Therapeutic exercise;Electrical Stimulation;Therapeutic activities;Neuromuscular re-education;Patient/family education;Manual techniques;Dry needling    PT Next Visit Plan see how she's doing, recert or d/c?    PT Home Exercise Plan Access Code: [email or web address]    Consulted and Agree with Plan of Care Patient           Patient will benefit from skilled therapeutic intervention in order to improve the following deficits and impairments:  Abnormal gait,Decreased activity tolerance,Decreased endurance,Decreased range of motion,Decreased strength,Difficulty walking,Impaired flexibility,Increased muscle  spasms,Increased edema,Postural dysfunction,Improper body mechanics,Pain  Visit Diagnosis: Abnormal posture  Difficulty walking  Muscle weakness (generalized)  Chronic  bilateral low back pain with right-sided sciatica     Problem List Patient Active Problem List   Diagnosis Date Noted  . Right lumbar radiculopathy 07/15/2020  . Headache 07/15/2020  . Bilateral shoulder pain 07/15/2020  . Upper back pain 07/15/2020  . Chest pain 07/15/2020  . Hypermobility syndrome 01/03/2017  . Nonallopathic lesion of thoracic region 01/03/2017  . Nonallopathic lesion of lumbosacral region 01/03/2017  . Nonallopathic lesion of sacral region 01/03/2017  . Low back pain with sciatica 09/11/2016  . Routine general medical examination at a health care facility 01/11/2016  . GERD (gastroesophageal reflux disease) 03/09/2014      Laureen Abrahams, PT, DPT 02/08/21 12:48 PM    [facility] Physical Therapy [address] [place], [place], [address] Phone: [phone]   Fax:  [phone]  Name: Ebony Allen MRN: [id number] Date of Birth: 1971-02-12

## 2021-02-11 ENCOUNTER — Other Ambulatory Visit: Payer: Self-pay

## 2021-02-11 ENCOUNTER — Ambulatory Visit (INDEPENDENT_AMBULATORY_CARE_PROVIDER_SITE_OTHER): Payer: 59 | Admitting: Rehabilitative and Restorative Service Providers"

## 2021-02-11 ENCOUNTER — Encounter: Payer: Self-pay | Admitting: Rehabilitative and Restorative Service Providers"

## 2021-02-11 DIAGNOSIS — M5441 Lumbago with sciatica, right side: Secondary | ICD-10-CM

## 2021-02-11 DIAGNOSIS — R293 Abnormal posture: Secondary | ICD-10-CM

## 2021-02-11 DIAGNOSIS — R262 Difficulty in walking, not elsewhere classified: Secondary | ICD-10-CM

## 2021-02-11 DIAGNOSIS — M6281 Muscle weakness (generalized): Secondary | ICD-10-CM

## 2021-02-11 DIAGNOSIS — G8929 Other chronic pain: Secondary | ICD-10-CM

## 2021-02-11 NOTE — Therapy (Addendum)
[facility] Physical Therapy [address] [place], [place], [address] Phone: [phone]   Fax:  [phone]  Physical Therapy Treatment/Reassessment  - Discharge  Patient Details  Name: Ebony Allen MRN: [id number] Date of Birth: Jul 18, 1971 Referring Provider (PT): Gregor Hams MD   Encounter Date: 02/11/2021   PT End of Session - 02/11/21 1349     Visit Number 8    Number of Visits 16    Date for PT Re-Evaluation 03/10/21    PT Start Time 1109    PT Stop Time 1149    PT Time Calculation (min) 40 min    Activity Tolerance Patient tolerated treatment well;No increased pain    Behavior During Therapy WFL for tasks assessed/performed             Past Medical History:  Diagnosis Date   Anemia    GERD (gastroesophageal reflux disease)     Past Surgical History:  Procedure Laterality Date   DILATATION & CURETTAGE/HYSTEROSCOPY WITH MYOSURE N/A 11/17/2014   Procedure: DILATATION & CURETTAGE/HYSTEROSCOPY WITH MYOSURE;  Surgeon: Delsa Bern, MD;  Location: [facility];  Service: Gynecology;  Laterality: N/A;   DILITATION & CURRETTAGE/HYSTROSCOPY WITH VERSAPOINT RESECTION N/A 05/20/2015   Procedure: DILATATION & CURETTAGE/HYSTEROSCOPY WITH VERSAPOINT RESECTION;  Surgeon: Delsa Bern, MD;  Location: [facility];  Service: Gynecology;  Laterality: N/A;   esophageal dilation     Dr Ronalee Red   ROBOT ASSISTED MYOMECTOMY N/A 11/17/2014   Procedure: ROBOTIC ASSISTED MYOMECTOMY;  Surgeon: Delsa Bern, MD;  Location: [facility];  Service: Gynecology;  Laterality: N/A;    There were no vitals filed for this visit.   Subjective Assessment - 02/11/21 1142     Subjective No R leg pain over the past week.  Minor low back and R gluteal pain at a 3/10 level.    Limitations Sitting;Lifting;Standing;Walking    How long can you sit comfortably? 1 hour (was < 30 minutes)    How long can you stand comfortably? 30 minutes (was < 15 minutes)    How long can you walk comfortably? Not  walking for exercise like she previously did    Patient Stated Goals Get rid of back pain and return to previous active lifestyle    Currently in Pain? Yes    Pain Score 3     Pain Location Back    Pain Orientation Lower    Pain Descriptors / Indicators Aching;Tightness    Pain Type Chronic pain    Pain Radiating Towards NA    Pain Onset More than a month ago    Pain Frequency Constant    Aggravating Factors  Prolonged postures and walking    Pain Relieving Factors Tylenol    Effect of Pain on Daily Activities Easing back into normal workouts    Multiple Pain Sites No                [facility] PT Assessment - 02/11/21 0001       AROM   Right/Left Hip Left;Right    Right Hip Flexion 110    Right Hip External Rotation  33    Right Hip Internal Rotation  20    Left Hip Flexion 115    Left Hip External Rotation  30    Left Hip Internal Rotation  22    Lumbar Extension 25      Flexibility   Soft Tissue Assessment /Muscle Length yes    Hamstrings 60 degrees B  [facility] PT Treatment/Exercise - 02/11/21 0001       Posture/Postural Control   Posture/Postural Control Postural limitations    Postural Limitations Forward head;Rounded Shoulders;Decreased lumbar lordosis      Therapeutic Activites    Therapeutic Activities Other Therapeutic Activities    Other Therapeutic Activities Reviewed exam findings and updated HEP      Exercises   Exercises Lumbar      Lumbar Exercises: Stretches   Single Knee to Chest Stretch Left;Right;4 reps;20 seconds    Figure 4 Stretch 4 reps;20 seconds      Lumbar Exercises: Aerobic   Tread Mill 12 minutes at 2.8 MPH while we reviewed exam findings and updated HEP      Lumbar Exercises: Machines for Strengthening   Other Lumbar Machine Exercise Attempted pull up for upper back strength      Lumbar Exercises: Standing   Shoulder ADduction Strengthening;Both;10 reps;Limitations    Shoulder  Adduction Limitations 5 seconds shoulder blade pinches    Other Standing Lumbar Exercises Trunk extension AROM 10X 3 seconds    Other Standing Lumbar Exercises Hip hike in doorframe 10X for 3 seconds and hip abduction with pelvic stabilization 2 sets of 5 for 3 seconds      Lumbar Exercises: Prone   Straight Leg Raise 10 reps;5 seconds    Opposite Arm/Leg Raise Right arm/Left leg;Left arm/Right leg;10 reps;3 seconds;Other (comment)    Opposite Arm/Leg Raise Limitations 2 sets                    PT Education - 02/11/21 1346     Education Details Reviewed RA findings and updated/DC HEP.    Person(s) Educated Patient    Methods Explanation;Demonstration;Verbal cues;Handout    Comprehension Verbal cues required;Returned demonstration;Verbalized understanding              PT Short Term Goals - 02/11/21 1347       PT SHORT TERM GOAL #1   Title Ebony Allen will be independent with her starter HEP.    Time 4    Period Weeks    Status Achieved    Target Date 02/10/21               PT Long Term Goals - 02/11/21 1347       PT LONG TERM GOAL #1   Title Improve FOTO score to 66.    Baseline 57 on 02/04/2021 (was 70)    Time 8    Period Weeks    Status On-going      PT LONG TERM GOAL #2   Title Ebony Allen will report low back pain consistently 0-2/10 with no complaints of R LE pain.    Baseline Can be 3/10 (was 5+/10)    Time 8    Period Weeks    Status On-going      PT LONG TERM GOAL #3   Title Improve B hip flexors flexibility to 110 and hip ER AROM to 40 degrees.    Baseline 115/110 and 30/33 L/R, respectively    Time 8    Period Weeks    Status Partially Met      PT LONG TERM GOAL #4   Title Improve trunk extension AROM to 20 degrees.    Baseline 25, was 10    Time 8    Period Weeks    Status Achieved      PT LONG TERM GOAL #5   Title Improve core strength as assessed by objective testing  and FOTO.    Baseline 60 secponds spine strength test (Goal 60)     Time 8    Period Weeks    Status Achieved      PT LONG TERM GOAL #6   Title Ebony Allen will be independent with her long-term HEP at DC.    Time 8    Period Weeks    Status Achieved                   Plan - 02/11/21 1349     Clinical Impression Statement Ebony Allen has not had LE pain this week.  Low back pain is intermittent and 3/10 at worst on a 0-10/10 scale.  She has demonstrated independence and compliance with her HEP which was updated today.  She wants to see how she does next week before discharging from physical therapy, although I expect pain to be 2/10 or less and that she will be ready to transfer into independent rehabilitation within a week.  She is welcome to return for more supervised PT if requested.    Examination-Activity Limitations Sit;Transfers;Sleep;Bed Mobility;Bend;Lift;Locomotion Level;Carry;Stand    Examination-Participation Restrictions Interpersonal Relationship;Occupation;Community Activity    Stability/Clinical Decision Making Stable/Uncomplicated    Rehab Potential Good    PT Frequency 1x / week    PT Duration Other (comment)   Anticipate DC   PT Treatment/Interventions ADLs/Self Care Home Management;Traction;Cryotherapy;Therapeutic exercise;Electrical Stimulation;Therapeutic activities;Neuromuscular re-education;Patient/family education;Manual techniques;Dry needling    PT Next Visit Plan see how she's doing, recert or d/c?    PT Home Exercise Plan Access Code: [email or web address]    Consulted and Agree with Plan of Care Patient             Patient will benefit from skilled therapeutic intervention in order to improve the following deficits and impairments:  Abnormal gait,Decreased activity tolerance,Decreased endurance,Decreased range of motion,Decreased strength,Difficulty walking,Impaired flexibility,Increased muscle spasms,Increased edema,Postural dysfunction,Improper body mechanics,Pain  Visit Diagnosis: Abnormal posture  Difficulty walking  Muscle  weakness (generalized)  Chronic bilateral low back pain with right-sided sciatica     Problem List Patient Active Problem List   Diagnosis Date Noted   Right lumbar radiculopathy 07/15/2020   Headache 07/15/2020   Bilateral shoulder pain 07/15/2020   Upper back pain 07/15/2020   Chest pain 07/15/2020   Hypermobility syndrome 01/03/2017   Nonallopathic lesion of thoracic region 01/03/2017   Nonallopathic lesion of lumbosacral region 01/03/2017   Nonallopathic lesion of sacral region 01/03/2017   Low back pain with sciatica 09/11/2016   Routine general medical examination at a health care facility 01/11/2016   GERD (gastroesophageal reflux disease) 03/09/2014    Farley Ly PT, MPT 02/11/2021, 1:55 PM  PHYSICAL THERAPY DISCHARGE SUMMARY  Visits from Start of Care: 8  Current functional level related to goals / functional outcomes: See note   Remaining deficits: See note   Education / Equipment: HEP   Patient agrees to discharge. Patient goals were partially met. Patient is being discharged due to not returning since the last visit.  Scot Jun, PT, DPT, OCS, ATC 03/22/21  9:57 AM    [facility] Physical Therapy [address] [place], [place], [address] Phone: [phone]   Fax:  [phone]  Name: Ebony Allen MRN: [id number] Date of Birth: 07-16-1971

## 2021-02-11 NOTE — Patient Instructions (Signed)
Access Code: [email or web address] URL: [email or web address] Date: 02/11/2021 Prepared by: Vista Mink  Exercises Single Knee to Chest Stretch - 1 x daily - 7 x weekly - 1 sets - 1-2 reps - 20 seconds hold Supine Figure 4 Piriformis Stretch - 1-2 x daily - 7 x weekly - 1 sets - 5 reps - 20 seconds hold Standing Scapular Retraction - 5 x daily - 7 x weekly - 1 sets - 5 reps - 5 second hold Standing Lumbar Extension at Wall - Forearms - 5 x daily - 7 x weekly - 1 sets - 5 reps - 3 seconds hold Prone Alternating Arm and Leg Lifts - 1 x daily - 5-7 x weekly - 1-2 sets - 10 reps - 10 seconds hold Standing Hip Hiking - 1 x daily - 5-7 x weekly - 2 sets - 5 reps - 5 seconds hold Full Superman on Table - 1 x daily - 5-7 x weekly - 1 sets - 5 reps - 10 seconds hold
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
## 2021-02-23 ENCOUNTER — Other Ambulatory Visit: Payer: Self-pay

## 2021-02-23 ENCOUNTER — Ambulatory Visit (INDEPENDENT_AMBULATORY_CARE_PROVIDER_SITE_OTHER): Payer: 59 | Admitting: Family Medicine

## 2021-02-23 VITALS — BP 103/71 | HR 68 | Ht 63.0 in | Wt 144.0 lb

## 2021-02-23 DIAGNOSIS — M545 Low back pain, unspecified: Secondary | ICD-10-CM | POA: Diagnosis not present

## 2021-02-23 DIAGNOSIS — G8929 Other chronic pain: Secondary | ICD-10-CM | POA: Diagnosis not present

## 2021-02-23 DIAGNOSIS — M533 Sacrococcygeal disorders, not elsewhere classified: Secondary | ICD-10-CM | POA: Diagnosis not present

## 2021-02-23 NOTE — Patient Instructions (Signed)
Thank you for coming in today.  Continue limited PT and home exercises.   Advance activity as tolerated.   In the near future ok to start a return to running program.   One of those couch to 5K program is a good way to start.   Recheck with me as needed.   I can do an injection into the SI joint at any time.

## 2021-08-04 ENCOUNTER — Telehealth: Payer: Self-pay | Admitting: Internal Medicine

## 2021-08-04 NOTE — Telephone Encounter (Signed)
Ok to schedule a nurse visit?

## 2021-08-04 NOTE — Telephone Encounter (Signed)
Pt. Is requesting to receive shingles shot. Pt. Was advised that appt would be scheduled once MD gave approval.    Please advise.

## 2021-08-05 ENCOUNTER — Encounter: Payer: Self-pay | Admitting: Family

## 2021-08-05 ENCOUNTER — Telehealth: Payer: 59 | Admitting: Internal Medicine

## 2021-08-05 ENCOUNTER — Telehealth (INDEPENDENT_AMBULATORY_CARE_PROVIDER_SITE_OTHER): Payer: 59 | Admitting: Family

## 2021-08-05 VITALS — Ht 63.0 in | Wt 144.0 lb

## 2021-08-05 DIAGNOSIS — J019 Acute sinusitis, unspecified: Secondary | ICD-10-CM | POA: Diagnosis not present

## 2021-08-05 NOTE — Assessment & Plan Note (Signed)
Sx for 6 days, taking Coricidin. Pt has no HTN or other heart condition noted in chart. Advised to switch to generic Sudafed Claritin D for the next 5 days or until sx improve, also start saline nasal spray tid, drink 64oz water qd.

## 2021-08-05 NOTE — Progress Notes (Signed)
MyChart Video Visit    Virtual Visit via Video Note   This visit type was conducted due to national recommendations for restrictions regarding the COVID-19 Pandemic (e.g. social distancing) in an effort to limit this patient's exposure and mitigate transmission in our community. This patient is at least at moderate risk for complications without adequate follow up. This format is felt to be most appropriate for this patient at this time. Physical exam was limited by quality of the video and audio technology used for the visit. CMA was able to get the patient set up on a video visit.  Patient location: Home. Patient and provider in visit Provider location: Office  I discussed the limitations of evaluation and management by telemedicine and the availability of in person appointments. The patient expressed understanding and agreed to proceed.  Visit Date: 08/05/2021  Today's healthcare provider: Jeanie Sewer, NP     Subjective:    Patient ID: Ebony Allen, female    DOB: [birth year], 50 y.o.   MRN: [id number]  Chief Complaint  Patient presents with   Nasal Congestion    Starting last weekend. She has taken Coricidin. Has not helped. She denies headache and fever.    HPI Sinusitis: Patient presents with acute sinusitis.  Her symptoms include nasal congestion, clear rhinorrhea, denies any sinus pain or pressure today. There has not been a history of fevers, headaches. There has not been a history of chronic otitis media or pharyngotonsillitis.  Prior antibiotic therapy has included none. Other medications have included Coricidin with little relief. She has not used any nasal sprays.  She has not had allergy testing.    Past Medical History:  Diagnosis Date   Anemia    GERD (gastroesophageal reflux disease)     Past Surgical History:  Procedure Laterality Date   DILATATION & CURETTAGE/HYSTEROSCOPY WITH MYOSURE N/A 11/17/2014   Procedure: DILATATION & CURETTAGE/HYSTEROSCOPY  WITH MYOSURE;  Surgeon: Delsa Bern, MD;  Location: [facility];  Service: Gynecology;  Laterality: N/A;   DILITATION & CURRETTAGE/HYSTROSCOPY WITH VERSAPOINT RESECTION N/A 05/20/2015   Procedure: DILATATION & CURETTAGE/HYSTEROSCOPY WITH VERSAPOINT RESECTION;  Surgeon: Delsa Bern, MD;  Location: [facility];  Service: Gynecology;  Laterality: N/A;   esophageal dilation     Dr Ronalee Red   ROBOT ASSISTED MYOMECTOMY N/A 11/17/2014   Procedure: ROBOTIC ASSISTED MYOMECTOMY;  Surgeon: Delsa Bern, MD;  Location: [facility];  Service: Gynecology;  Laterality: N/A;    Outpatient Medications Prior to Visit  Medication Sig Dispense Refill   Ascorbic Acid (VITAMIN C PO) Take by mouth.     BIOTIN PO Take by mouth.     Cholecalciferol (VITAMIN D) 2000 UNITS CAPS Take 1 capsule by mouth daily.     Cranberry 500 MG CHEW Chew 1 tablet by mouth 2 (two) times daily after a meal. 30 tablet 0   diazepam (VALIUM) 5 MG tablet 1 tab by mouth 1 hour prior to MRI 1 tablet 0   fluticasone (FLONASE) 50 MCG/ACT nasal spray Place 2 sprays into both nostrils daily. 16 g 6   IRON PO Take by mouth.     Multiple Vitamins-Minerals (HAIR SKIN AND NAILS FORMULA PO) Hair,Skin and Nails     Multiple Vitamins-Minerals (HAIR/SKIN/NAILS PO) Take 1 tablet by mouth daily.     Omega-3 Fatty Acids (FISH OIL) 1000 MG CAPS Fish Oil     traMADol (ULTRAM) 50 MG tablet Take 1 tablet (50 mg total) by mouth every 8 (eight) hours as needed. 20 tablet 0  tranexamic acid (LYSTEDA) 650 MG TABS tablet      Colloidal Oatmeal (AVEENO ECZEMA THERAPY) 1 % CREA See admin instructions. (Patient not taking: Reported on 08/05/2021)     methocarbamol (ROBAXIN) 500 MG tablet Take 1 tablet (500 mg total) by mouth every 8 (eight) hours as needed. (Patient not taking: No sig reported) 30 tablet 0   naproxen (NAPROSYN) 500 MG tablet Take 1 tablet (500 mg total) by mouth 2 (two) times daily with a meal. (Patient not taking: No sig reported) 30 tablet 0   No  facility-administered medications prior to visit.    Allergies  Allergen Reactions   Latex Swelling   Nickel Rash        Objective:     Physical Exam Vitals and nursing note reviewed.  Constitutional:      General: She is not in acute distress.    Appearance: Normal appearance.  HENT:     Head: Normocephalic.  Pulmonary:     Effort: No respiratory distress.  Musculoskeletal:     Cervical back: Normal range of motion.  Skin:    General: Skin is dry.     Coloration: Skin is not pale.  Neurological:     Mental Status: She is alert and oriented to person, place, and time.  Psychiatric:        Mood and Affect: Mood normal.   Ht 5\' 3"  (1.6 m)   Wt 143 lb 15.4 oz (65.3 kg)   BMI 25.50 kg/m   Wt Readings from Last 3 Encounters:  08/05/21 143 lb 15.4 oz (65.3 kg)  02/23/21 144 lb (65.3 kg)  01/12/21 145 lb (65.8 kg)       Assessment & Plan:   Problem List Items Addressed This Visit       Respiratory   Acute non-recurrent sinusitis - Primary    Sx for 6 days, taking Coricidin. Pt has no HTN or other heart condition noted in chart. Advised to switch to generic Sudafed Claritin D for the next 5 days or until sx improve, also start saline nasal spray tid, drink 64oz water qd.       No orders of the defined types were placed in this encounter.   I discussed the assessment and treatment plan with the patient. The patient was provided an opportunity to ask questions and all were answered. The patient agreed with the plan and demonstrated an understanding of the instructions.   The patient was advised to call back or seek an in-person evaluation if the symptoms worsen or if the condition fails to improve as anticipated.  I provided 23 minutes of face-to-face time during this encounter.   Jeanie Sewer, NP [place] [phone] (phone) [phone] (fax)  [place]

## 2021-08-08 NOTE — Telephone Encounter (Signed)
[place] for shingrix

## 2021-08-09 NOTE — Telephone Encounter (Signed)
Apptmnt has been made.

## 2021-08-12 ENCOUNTER — Ambulatory Visit: Payer: 59

## 2021-08-12 ENCOUNTER — Ambulatory Visit (INDEPENDENT_AMBULATORY_CARE_PROVIDER_SITE_OTHER): Payer: 59

## 2021-08-12 ENCOUNTER — Other Ambulatory Visit: Payer: Self-pay

## 2021-08-12 DIAGNOSIS — Z23 Encounter for immunization: Secondary | ICD-10-CM | POA: Diagnosis not present

## 2021-08-12 NOTE — Progress Notes (Signed)
Pt given 1st shingles vacc w/o any complications.

## 2021-11-23 DIAGNOSIS — R32 Unspecified urinary incontinence: Secondary | ICD-10-CM | POA: Diagnosis not present

## 2021-11-23 DIAGNOSIS — Z139 Encounter for screening, unspecified: Secondary | ICD-10-CM | POA: Diagnosis not present

## 2021-11-24 DIAGNOSIS — R87619 Unspecified abnormal cytological findings in specimens from cervix uteri: Secondary | ICD-10-CM | POA: Diagnosis not present

## 2021-12-07 ENCOUNTER — Other Ambulatory Visit: Payer: Self-pay

## 2021-12-07 ENCOUNTER — Ambulatory Visit (INDEPENDENT_AMBULATORY_CARE_PROVIDER_SITE_OTHER): Payer: 59

## 2021-12-07 DIAGNOSIS — Z23 Encounter for immunization: Secondary | ICD-10-CM | POA: Diagnosis not present

## 2021-12-07 NOTE — Progress Notes (Signed)
Pt came in for 2nd shingles vaccination. Vaccine was given IM, pt tolerated well. ?

## 2022-01-25 ENCOUNTER — Encounter: Payer: Self-pay | Admitting: Internal Medicine

## 2022-01-25 ENCOUNTER — Ambulatory Visit (INDEPENDENT_AMBULATORY_CARE_PROVIDER_SITE_OTHER): Payer: 59 | Admitting: Internal Medicine

## 2022-01-25 VITALS — BP 118/70 | HR 59 | Resp 18 | Ht 63.0 in | Wt 144.0 lb

## 2022-01-25 DIAGNOSIS — B351 Tinea unguium: Secondary | ICD-10-CM | POA: Diagnosis not present

## 2022-01-25 DIAGNOSIS — R32 Unspecified urinary incontinence: Secondary | ICD-10-CM

## 2022-01-25 DIAGNOSIS — R81 Glycosuria: Secondary | ICD-10-CM | POA: Diagnosis not present

## 2022-01-25 DIAGNOSIS — Z136 Encounter for screening for cardiovascular disorders: Secondary | ICD-10-CM | POA: Diagnosis not present

## 2022-01-25 DIAGNOSIS — Z0000 Encounter for general adult medical examination without abnormal findings: Secondary | ICD-10-CM

## 2022-01-25 LAB — COMPREHENSIVE METABOLIC PANEL
ALT: 25 U/L (ref 0–35)
AST: 26 U/L (ref 0–37)
Albumin: 4.2 g/dL (ref 3.5–5.2)
Alkaline Phosphatase: 61 U/L (ref 39–117)
BUN: 16 mg/dL (ref 6–23)
CO2: 28 mEq/L (ref 19–32)
Calcium: 9.4 mg/dL (ref 8.4–10.5)
Chloride: 104 mEq/L (ref 96–112)
Creatinine, Ser: 1 mg/dL (ref 0.40–1.20)
GFR: 65.24 mL/min (ref >60.00)
Glucose, Bld: 79 mg/dL (ref 70–99)
Potassium: 3.9 mEq/L (ref 3.5–5.1)
Sodium: 139 mEq/L (ref 135–145)
Total Bilirubin: 0.5 mg/dL (ref 0.2–1.2)
Total Protein: 7.1 g/dL (ref 6.0–8.3)

## 2022-01-25 LAB — LIPID PANEL
Cholesterol: 187 mg/dL (ref 0–200)
HDL: 60.2 mg/dL (ref >39.00)
LDL Cholesterol: 115 mg/dL — ABNORMAL HIGH (ref 0–99)
NonHDL: 126.42
Total CHOL/HDL Ratio: 3
Triglycerides: 57 mg/dL (ref 0.0–149.0)
VLDL: 11.4 mg/dL (ref 0.0–40.0)

## 2022-01-25 LAB — CBC
HCT: 43.1 % (ref 36.0–46.0)
Hemoglobin: 14.1 g/dL (ref 12.0–15.0)
MCHC: 32.8 g/dL (ref 30.0–36.0)
MCV: 92.4 fl (ref 78.0–100.0)
Platelets: 219 10*3/uL (ref 150.0–400.0)
RBC: 4.66 Mil/uL (ref 3.87–5.11)
RDW: 12.8 % (ref 11.5–15.5)
WBC: 4.5 10*3/uL (ref 4.0–10.5)

## 2022-01-25 LAB — HEMOGLOBIN A1C: Hgb A1c MFr Bld: 6 % (ref 4.6–6.5)

## 2022-01-25 MED ORDER — OXYBUTYNIN CHLORIDE ER 10 MG PO TB24: 10.0000 mg | ORAL_TABLET | Freq: Every day | ORAL | 1 refills | Status: DC

## 2022-01-25 MED ORDER — CICLOPIROX 8 % EX SOLN: Freq: Every day | CUTANEOUS | 0 refills | Status: DC

## 2022-01-25 NOTE — Patient Instructions (Addendum)
We have sent in the topical to use daily for the toenails.  ? ?We have sent in the bladder medicine oxybutynin to take daily.  ? ?We will check the labs today. ?

## 2022-01-25 NOTE — Progress Notes (Signed)
? ?  Subjective:  ? ?Patient ID: Ebony Allen, female    DOB: 1971-03-29, 51 y.o.   MRN: [id number] ? ?HPI ?The patient is a 51 YO female coming in for several concerns.  ? ?Review of Systems  ?Constitutional: Negative.   ?HENT: Negative.    ?Eyes: Negative.   ?Respiratory:  Negative for cough, chest tightness and shortness of breath.   ?Cardiovascular:  Negative for chest pain, palpitations and leg swelling.  ?Gastrointestinal:  Negative for abdominal distention, abdominal pain, constipation, diarrhea, nausea and vomiting.  ?Genitourinary:  Positive for frequency and urgency.  ?Musculoskeletal: Negative.   ?Skin:  Positive for color change.  ?Neurological: Negative.   ?Psychiatric/Behavioral: Negative.    ? ?Objective:  ?Physical Exam ?Constitutional:   ?   Appearance: She is well-developed.  ?HENT:  ?   Head: Normocephalic and atraumatic.  ?Cardiovascular:  ?   Rate and Rhythm: Normal rate and regular rhythm.  ?Pulmonary:  ?   Effort: Pulmonary effort is normal. No respiratory distress.  ?   Breath sounds: Normal breath sounds. No wheezing or rales.  ?Abdominal:  ?   General: Bowel sounds are normal. There is no distension.  ?   Palpations: Abdomen is soft.  ?   Tenderness: There is no abdominal tenderness. There is no rebound.  ?Musculoskeletal:  ?   Cervical back: Normal range of motion.  ?Skin: ?   General: Skin is warm and dry.  ?   Comments: Toenail changes  ?Neurological:  ?   Mental Status: She is alert and oriented to person, place, and time.  ?   Coordination: Coordination normal.  ? ? ?Vitals:  ? 01/25/22 1030  ?BP: 118/70  ?Pulse: (!) 59  ?Resp: 18  ?Weight: 144 lb (65.3 kg)  ?Height: '5\' 3"'$  (1.6 m)  ? ? ?This visit occurred during the SARS-CoV-2 public health emergency.  Safety protocols were in place, including screening questions prior to the visit, additional usage of staff PPE, and extensive cleaning of exam room while observing appropriate contact time as indicated for disinfecting solutions.   ? ?Assessment & Plan:  ? ?

## 2022-01-26 ENCOUNTER — Telehealth: Payer: Self-pay | Admitting: Internal Medicine

## 2022-01-26 DIAGNOSIS — B351 Tinea unguium: Secondary | ICD-10-CM | POA: Insufficient documentation

## 2022-01-26 DIAGNOSIS — R32 Unspecified urinary incontinence: Secondary | ICD-10-CM | POA: Insufficient documentation

## 2022-01-26 DIAGNOSIS — R81 Glycosuria: Secondary | ICD-10-CM | POA: Insufficient documentation

## 2022-01-26 DIAGNOSIS — R7303 Prediabetes: Secondary | ICD-10-CM | POA: Insufficient documentation

## 2022-01-26 NOTE — Assessment & Plan Note (Signed)
She thinks there was glucose in her urine at the ob/gyn. Does not have those records for [place]. We will check HgA1c and CMP to screen for that today.  ?

## 2022-01-26 NOTE — Telephone Encounter (Signed)
[place], [place] - [address] Phone:  [phone]  ?Fax:  [phone]  ?  ?  ? ?Pt states that for one medication, insurance will not cover the full dosage amount. For another medication, a prior authorization is needed.  ? ?Pt does not have name of medications. ? ?Please send meds that were sent on yesterday to above pharmacy.  ?

## 2022-01-26 NOTE — Assessment & Plan Note (Signed)
Discussed multiple options with patient and we elected to go to ciclopirox solution for 3 months which was prescribed today.  ?

## 2022-01-26 NOTE — Assessment & Plan Note (Signed)
Previously checked for infection and this is not present. She is having some urgency and incontinence issues. Rx oxybutynin 10 mg ER daily and adjust as needed.  ?

## 2022-01-30 ENCOUNTER — Telehealth: Payer: Self-pay

## 2022-01-30 NOTE — Telephone Encounter (Signed)
PA started ? ? ? ? ?Key: [id number] ?

## 2022-03-23 ENCOUNTER — Telehealth: Payer: Self-pay | Admitting: Internal Medicine

## 2022-03-23 NOTE — Telephone Encounter (Signed)
Caller & Relationship to patient: Dominiqua Cooner  Call back number: [phone]  Date of last office visit: 01/25/22  Date of next office visit:   Medication(s) to be refilled: ciclopirox (PENLAC) 8 % solution       Preferred Pharmacy:  [facility] [address] [place] - [place] [address] Phone:  [phone]  Fax:  [phone]

## 2022-03-23 NOTE — Telephone Encounter (Signed)
Okay to refill with 1 additional refill

## 2022-03-23 NOTE — Telephone Encounter (Signed)
Ok to refill 

## 2022-03-24 MED ORDER — CICLOPIROX 8 % EX SOLN: Freq: Every day | CUTANEOUS | 1 refills | Status: DC

## 2022-03-24 NOTE — Telephone Encounter (Signed)
Refill has been to the pt's pharmacy

## 2022-05-10 ENCOUNTER — Ambulatory Visit: Payer: BC Managed Care – PPO | Admitting: Internal Medicine

## 2022-05-10 ENCOUNTER — Encounter: Payer: Self-pay | Admitting: Internal Medicine

## 2022-05-10 VITALS — BP 120/74 | HR 59 | Temp 98.0°F | Ht 63.0 in | Wt 148.6 lb

## 2022-05-10 DIAGNOSIS — R202 Paresthesia of skin: Secondary | ICD-10-CM

## 2022-05-10 DIAGNOSIS — Z0000 Encounter for general adult medical examination without abnormal findings: Secondary | ICD-10-CM

## 2022-05-10 DIAGNOSIS — R7303 Prediabetes: Secondary | ICD-10-CM | POA: Diagnosis not present

## 2022-05-10 DIAGNOSIS — R200 Anesthesia of skin: Secondary | ICD-10-CM | POA: Diagnosis not present

## 2022-05-10 DIAGNOSIS — B351 Tinea unguium: Secondary | ICD-10-CM

## 2022-05-10 DIAGNOSIS — R32 Unspecified urinary incontinence: Secondary | ICD-10-CM

## 2022-05-10 LAB — CBC
HCT: 43.2 % (ref 36.0–46.0)
Hemoglobin: 14.2 g/dL (ref 12.0–15.0)
MCHC: 32.8 g/dL (ref 30.0–36.0)
MCV: 92.6 fl (ref 78.0–100.0)
Platelets: 207 10*3/uL (ref 150.0–400.0)
RBC: 4.66 Mil/uL (ref 3.87–5.11)
RDW: 12.8 % (ref 11.5–15.5)
WBC: 4.8 10*3/uL (ref 4.0–10.5)

## 2022-05-10 LAB — HEMOGLOBIN A1C: Hgb A1c MFr Bld: 6.1 % (ref 4.6–6.5)

## 2022-05-10 LAB — VITAMIN B12: Vitamin B-12: 651 pg/mL (ref 211–911)

## 2022-05-10 LAB — T4, FREE: Free T4: 0.85 ng/dL (ref 0.60–1.60)

## 2022-05-10 LAB — VITAMIN D 25 HYDROXY (VIT D DEFICIENCY, FRACTURES): VITD: 46.04 ng/mL (ref 30.00–100.00)

## 2022-05-10 LAB — TSH: TSH: 2.59 u[IU]/mL (ref 0.35–5.50)

## 2022-05-10 MED ORDER — TRIAMCINOLONE ACETONIDE 0.1 % EX CREA: 1.0000 | TOPICAL_CREAM | Freq: Two times a day (BID) | CUTANEOUS | 0 refills | Status: DC

## 2022-05-10 MED ORDER — CICLOPIROX 8 % EX SOLN: Freq: Every day | CUTANEOUS | 1 refills | Status: AC

## 2022-05-10 NOTE — Patient Instructions (Signed)
We will check the labs to see if we can find a reason for the numbness.  We have sent in the refill and cream.

## 2022-05-10 NOTE — Progress Notes (Signed)
   Subjective:   Patient ID: Ebony Allen, female    DOB: 03/31/1971, 51 y.o.   MRN: [id number]  HPI The patient is here for physical.  PMH, [facility], social history reviewed and updated  Review of Systems  Constitutional: Negative.   HENT: Negative.    Eyes: Negative.   Respiratory:  Negative for cough, chest tightness and shortness of breath.   Cardiovascular:  Negative for chest pain, palpitations and leg swelling.  Gastrointestinal:  Negative for abdominal distention, abdominal pain, constipation, diarrhea, nausea and vomiting.  Musculoskeletal: Negative.   Skin: Negative.   Neurological:  Positive for numbness.  Psychiatric/Behavioral: Negative.      Objective:  Physical Exam Constitutional:      Appearance: She is well-developed.  HENT:     Head: Normocephalic and atraumatic.  Cardiovascular:     Rate and Rhythm: Normal rate and regular rhythm.  Pulmonary:     Effort: Pulmonary effort is normal. No respiratory distress.     Breath sounds: Normal breath sounds. No wheezing or rales.  Abdominal:     General: Bowel sounds are normal. There is no distension.     Palpations: Abdomen is soft.     Tenderness: There is no abdominal tenderness. There is no rebound.  Musculoskeletal:     Cervical back: Normal range of motion.  Skin:    General: Skin is warm and dry.  Neurological:     Mental Status: She is alert and oriented to person, place, and time.     Coordination: Coordination normal.     Vitals:   05/10/22 1124  BP: 120/74  Pulse: (!) 59  Temp: 98 F (36.7 C)  TempSrc: Oral  SpO2: 99%  Weight: 148 lb 9.6 oz (67.4 kg)  Height: '5\' 3"'$  (1.6 m)    Assessment & Plan:

## 2022-05-12 DIAGNOSIS — R200 Anesthesia of skin: Secondary | ICD-10-CM | POA: Insufficient documentation

## 2022-05-12 NOTE — Assessment & Plan Note (Signed)
Symptoms better with oxybutynin 10 mg daily. Will continue.

## 2022-05-12 NOTE — Assessment & Plan Note (Signed)
Refilled penlac as she is having partial resolution.

## 2022-05-12 NOTE — Assessment & Plan Note (Signed)
Flu shot yearly. Covid-19 counseled. Shingrix complete. Tetanus up to date. Colonoscopy up to date. Mammogram up to date with gyn, pap smear up to date. Counseled about sun safety and mole surveillance. Counseled about the dangers of distracted driving. Given 10 year screening recommendations.

## 2022-05-12 NOTE — Assessment & Plan Note (Signed)
Checking HgA1c for monitoring.  

## 2022-05-12 NOTE — Assessment & Plan Note (Signed)
Checking vitamin D, B12, TSH, HgA1c, T4 and address as appropriate. She does have prior lumbar disease which could be causing the tingling in her legs.

## 2022-05-15 ENCOUNTER — Other Ambulatory Visit: Payer: Self-pay | Admitting: Internal Medicine

## 2022-05-15 DIAGNOSIS — R7303 Prediabetes: Secondary | ICD-10-CM

## 2022-05-16 NOTE — Telephone Encounter (Signed)
Patient states that this medication needs prior authorization. Her call back number is [phone]

## 2022-05-25 NOTE — Telephone Encounter (Addendum)
PA from June was denied I was able to send in another PA.   Key: [id number]  Your demographic data has been sent to [place] successfully!  [employer] typically takes 5-10 minutes to respond

## 2022-06-01 ENCOUNTER — Encounter: Payer: BC Managed Care – PPO | Attending: Internal Medicine | Admitting: Dietician

## 2022-06-01 ENCOUNTER — Encounter: Payer: Self-pay | Admitting: Dietician

## 2022-06-01 DIAGNOSIS — R7303 Prediabetes: Secondary | ICD-10-CM | POA: Insufficient documentation

## 2022-06-01 NOTE — Patient Instructions (Addendum)
Aim to continue getting 150 minutes of physical activity weekly. -Consider walking 10-15 minutes after a meal  Try to get your 3 bottles of water in daily (60oz)  Consider using less honey in your tea and coffee (1 tsp).  Plan:  Aim for 3 Carb Choices per meal (45 grams) +/- 1 either way  Aim for 0-15 Carbs per snack if hungry  Include protein in moderation with your meals and snacks Consider reading food labels for Total Carbohydrate of foods

## 2022-06-01 NOTE — Progress Notes (Signed)
Medical Nutrition Therapy  Appointment Start time:  43  Appointment End time:  1212  Primary concerns today: Pt states she was concerned and confused when she found out she had prediabetes. She wants to know how her A1C has gotten to 6.1% and how to get it back down.    Referral diagnosis: prediabetes Preferred learning style: no preference Learning readiness: ready   NUTRITION ASSESSMENT   Anthropometrics  Ht: 63 in Wt: not assessed  Clinical Medical Hx: GERD, anemia, prediabetes Medications: reviewed Labs: A1C 6.1% 05/10/22 Notable Signs/Symptoms: none at this time  Lifestyle & Dietary Hx Pt states she is lactose intolerant. She reports she does not eat red meat or pork, and eats mostly unsaturated fats when cooking (olive oil).   Pt has been physically active for a long time. She states she does HIIT or resistance training 4-5 times per week and used to run 5K's and has been considering getting back into running.   Estimated daily fluid intake: 40-60 oz Supplements: vitamin C, biotin, vitamin D, iron, MVI, fish oil. Sleep: 5-6 hours Stress / self-care: high stress, works out for self care. Current average weekly physical activity: HIIT and strength training 4-5x/wk  24-Hr Dietary Recall First Meal: protein bar Snack: apple OR smoothie OR trail mix OR peanut butter crackers Second Meal: salad greens with veggies and grilled chicken, cheese, vinaigrette or honey mustard dressing.  Snack: protein shake OR smoothie (water or almond milk, fruit, chia seeds) OR chips Third Meal: sweet potatoes with asparagus and salmon OR chicken, OR spaghetti OR [place] hot dog with baked beans.  Snack: frozen fruit bar OR occasionally lemon cookies Beverages: water, coffee with honey, herbal tea with honey   NUTRITION DIAGNOSIS  NB-1.1 Food and nutrition-related knowledge deficit As related to prediabetes.  As evidenced by pt report and diet history.   NUTRITION INTERVENTION  Nutrition  education (E-1) on the following topics:  Insulin resistance Importance of hydration Impact of exercise on blood sugar A1C goals and ranges Saturated and unsaturated fat Hydrogenated vegetable oils Balance of protein, carbohydrate and non-starchy vegetables Building balanced snacks  Handouts Provided Include  Dish up a healthy meal Snack sheet Meal Card  Learning Style & Readiness for Change Teaching method utilized: Visual & Auditory  Demonstrated degree of understanding via: Teach Back  Barriers to learning/adherence to lifestyle change: none  Goals Established by Pt Aim to continue getting 150 minutes of physical activity weekly. -Consider walking 10-15 minutes after a meal  Try to get your 3 bottles of water in daily (60oz)  Consider using less honey in your tea and coffee (1 tsp).  Plan:  Aim for 3 Carb Choices per meal (45 grams) +/- 1 either way  Aim for 0-15 Carbs per snack if hungry  Include protein in moderation with your meals and snacks Consider reading food labels for Total Carbohydrate of foods   MONITORING & EVALUATION Dietary intake, weekly physical activity, and follow up in 2 months.  Next Steps  Patient is to call for questions.

## 2022-07-23 IMAGING — DX DG SACRUM/COCCYX 2+V
3 series · 3 of 3 positions shown · non-contrast
Comparison: None.

CLINICAL DATA: Low back pain.  SI joint pain.

EXAM:
SACRUM AND COCCYX - 2+ VIEW

[sacrum ap]
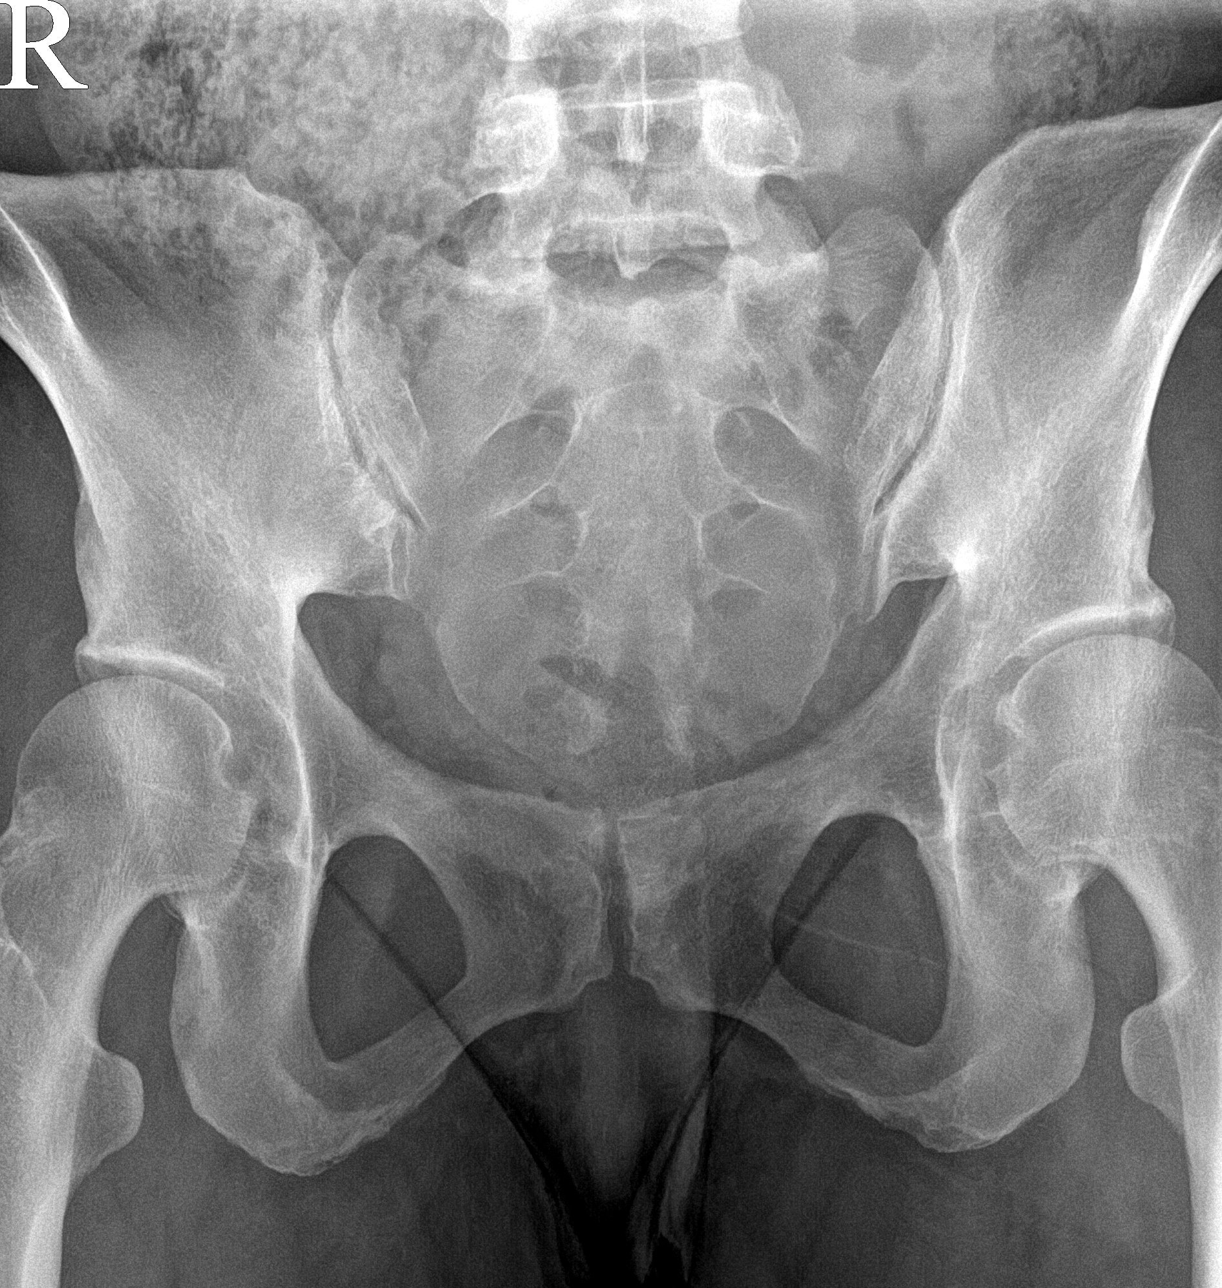

[coccyx ap]
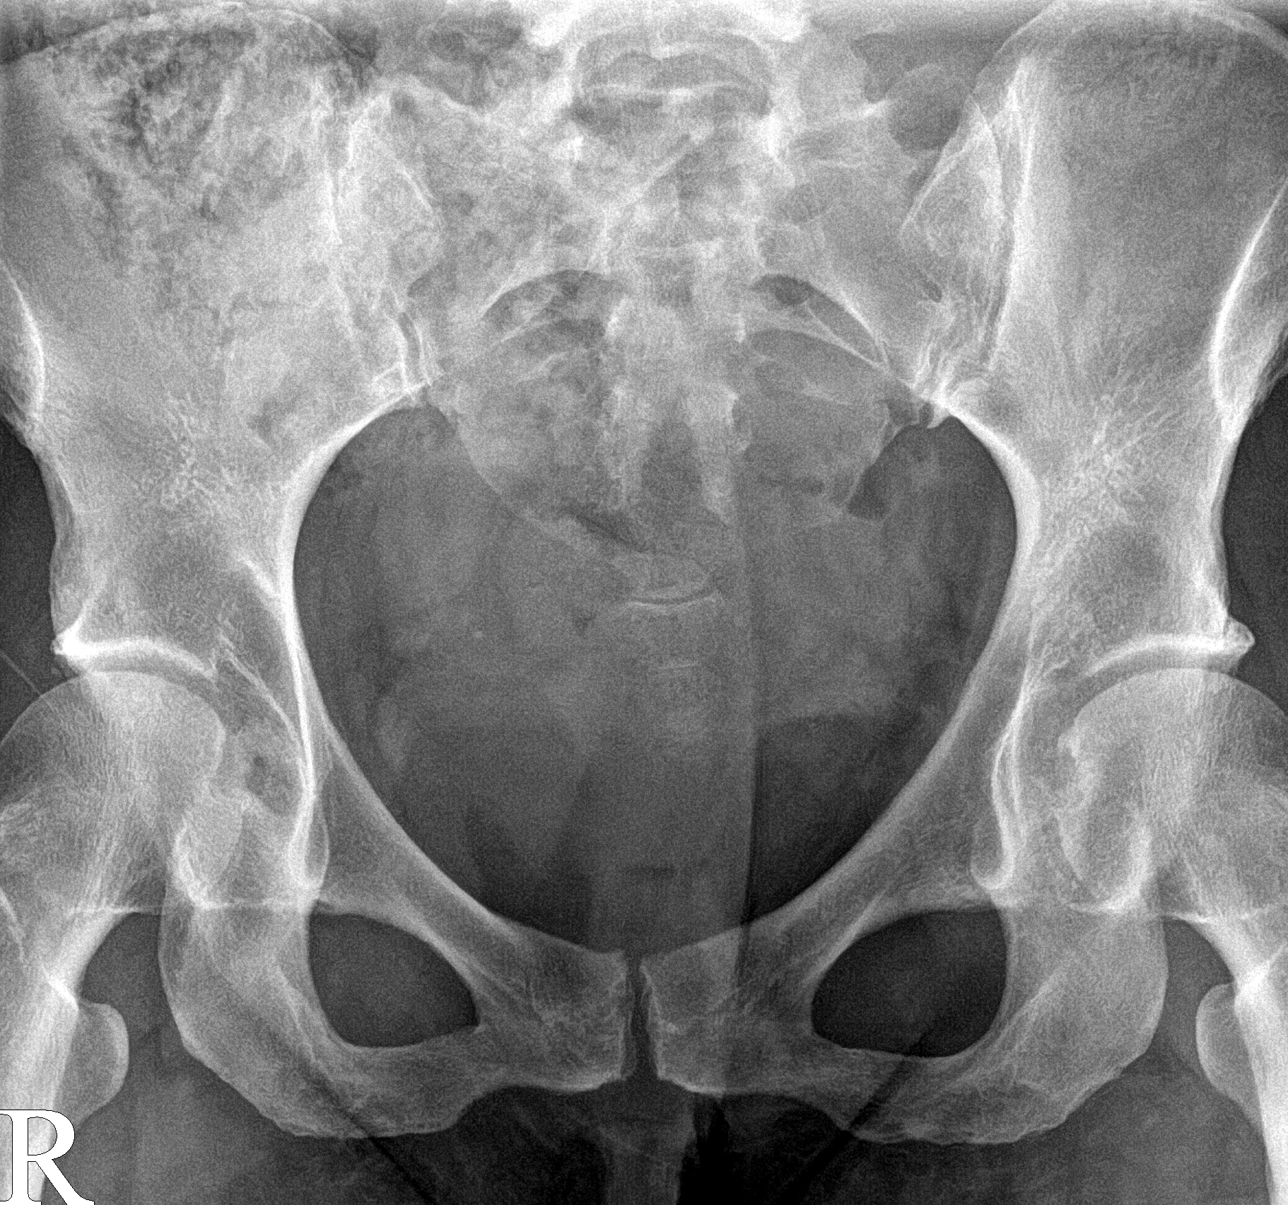

[sacrum lat]
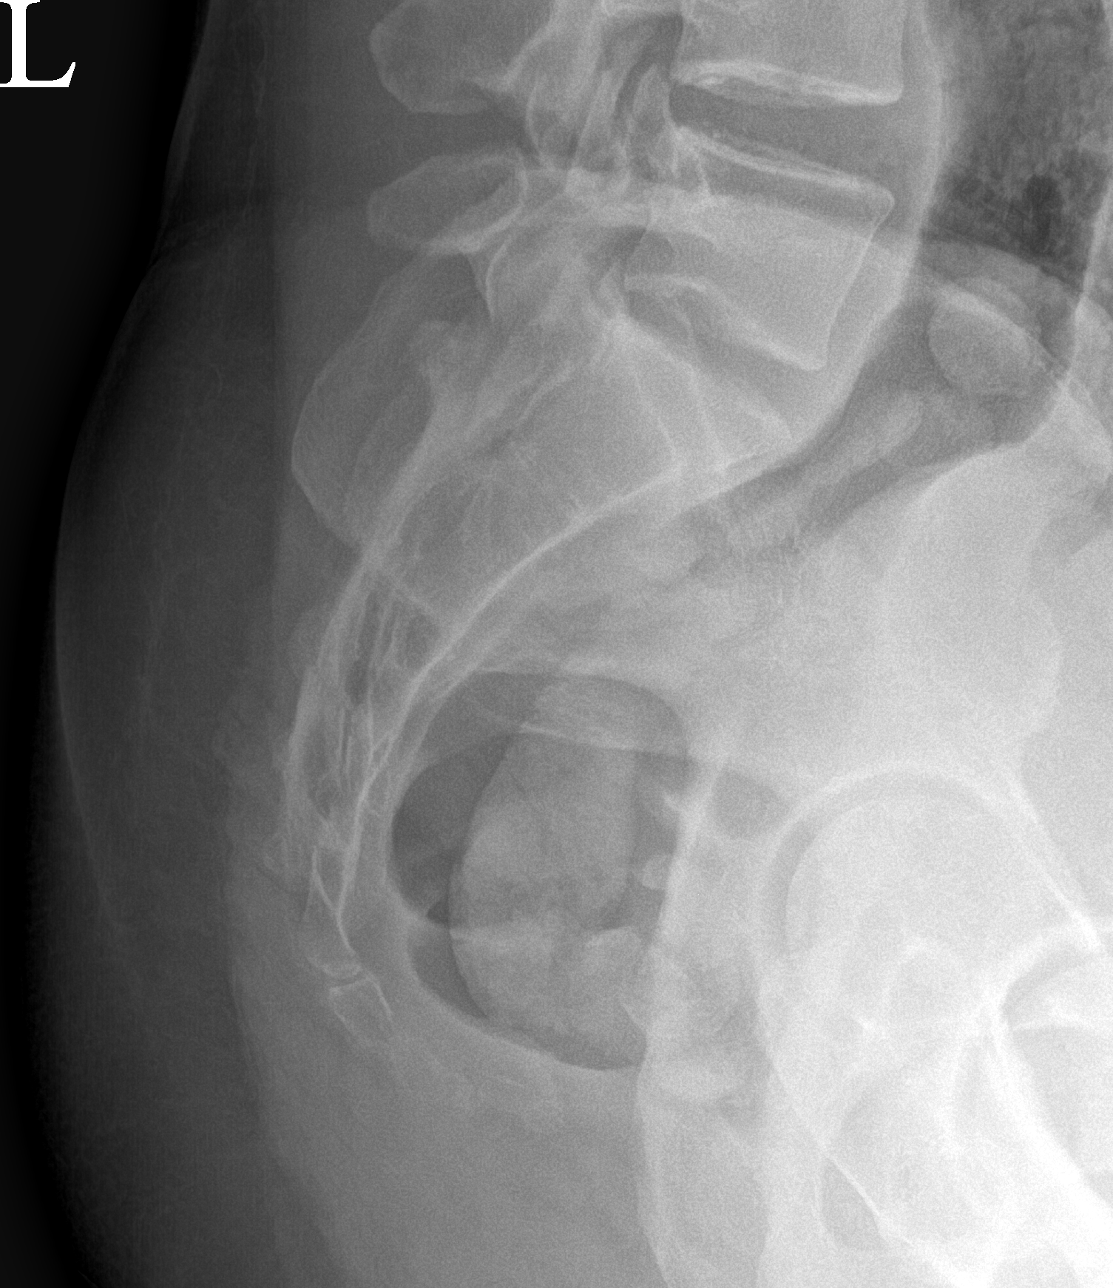

[3 of 3 positions shown; findings below may reference images not displayed]

FINDINGS: There is no evidence of fracture or other focal bone lesions.
IMPRESSION: Negative.

## 2022-08-03 ENCOUNTER — Ambulatory Visit: Payer: BC Managed Care – PPO | Admitting: Dietician

## 2023-03-26 ENCOUNTER — Ambulatory Visit: Payer: BC Managed Care – PPO | Admitting: Internal Medicine

## 2023-03-26 VITALS — BP 116/74 | HR 55 | Temp 98.5°F | Ht 63.0 in | Wt 157.0 lb

## 2023-03-26 DIAGNOSIS — B351 Tinea unguium: Secondary | ICD-10-CM | POA: Diagnosis not present

## 2023-03-26 DIAGNOSIS — M5441 Lumbago with sciatica, right side: Secondary | ICD-10-CM | POA: Diagnosis not present

## 2023-03-26 DIAGNOSIS — G8929 Other chronic pain: Secondary | ICD-10-CM

## 2023-03-26 MED ORDER — EFINACONAZOLE 10 % EX SOLN: CUTANEOUS | 2 refills | Status: DC

## 2023-03-26 NOTE — Assessment & Plan Note (Signed)
Chronic Left 4th toe Did ciclopirox

## 2023-03-26 NOTE — Patient Instructions (Signed)
       Medications changes include :   Jublia for your toenail    A referral was ordered PT and someone will call you to schedule an appointment.
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
## 2023-04-02 ENCOUNTER — Telehealth: Payer: Self-pay

## 2023-04-02 ENCOUNTER — Other Ambulatory Visit (HOSPITAL_COMMUNITY): Payer: Self-pay

## 2023-04-02 ENCOUNTER — Telehealth: Payer: Self-pay | Admitting: Internal Medicine

## 2023-04-02 NOTE — Telephone Encounter (Signed)
Patient said she was told Efinaconazole 10 % SOLN needs a prior authorization. Best callback is [phone].

## 2023-04-02 NOTE — Telephone Encounter (Signed)
Pharmacy Patient Advocate Encounter   Received notification that prior authorization for Jublia 10% solution is required/requested.   PA submitted to [facility] via CoverMyMeds Key # [id number] Status is pending

## 2023-04-03 ENCOUNTER — Other Ambulatory Visit (HOSPITAL_COMMUNITY): Payer: Self-pay

## 2023-04-03 NOTE — Telephone Encounter (Signed)
Can we have a nurse to reach out to pt to let her know what's going pt stating she is in pain and no one has reach out to her with a status update.

## 2023-04-03 NOTE — Telephone Encounter (Signed)
Patient Advocate Encounter  Prior Authorization for Jublia 10% has been approved with [employer].    PA# [id number] Effective dates: 04/03/23 through 04/01/24  Per [facility] test claim, copay for 28 days supply is $596.62 due to deductible

## 2023-04-19 ENCOUNTER — Ambulatory Visit: Payer: BC Managed Care – PPO | Admitting: Physical Therapy
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
## 2023-05-04 ENCOUNTER — Telehealth: Payer: BC Managed Care – PPO | Admitting: Internal Medicine

## 2023-05-04 ENCOUNTER — Encounter: Payer: Self-pay | Admitting: Internal Medicine

## 2023-05-04 DIAGNOSIS — M5441 Lumbago with sciatica, right side: Secondary | ICD-10-CM | POA: Diagnosis not present

## 2023-05-04 DIAGNOSIS — G8929 Other chronic pain: Secondary | ICD-10-CM | POA: Diagnosis not present

## 2023-05-04 MED ORDER — METHOCARBAMOL 500 MG PO TABS: 500.0000 mg | ORAL_TABLET | Freq: Three times a day (TID) | ORAL | 0 refills | Status: DC | PRN

## 2023-05-04 MED ORDER — TRAMADOL HCL 50 MG PO TABS: 50.0000 mg | ORAL_TABLET | Freq: Three times a day (TID) | ORAL | 0 refills | Status: DC | PRN

## 2023-05-04 MED ORDER — MELOXICAM 15 MG PO TABS: 15.0000 mg | ORAL_TABLET | Freq: Every day | ORAL | 0 refills | Status: DC

## 2023-05-04 NOTE — Progress Notes (Signed)
Virtual Visit via Video Note  I connected with Ebony Allen on 05/04/23 at 10:40 AM EDT by a video enabled telemedicine application and verified that I am speaking with the correct person using two identifiers.  The patient and the provider were at separate locations throughout the entire encounter. Patient location: home, Provider location: work   I discussed the limitations of evaluation and management by telemedicine and the availability of in person appointments. The patient expressed understanding and agreed to proceed. The patient and the provider were the only parties present for the visit unless noted in HPI below.  History of Present Illness: The patient is a 52 y.o. female with visit for back pain. Started with moving heavy objects due to roof leak. Started yesterday tried tylenol which did not help  Observations/Objective: Appearance: normal, appears uncomfortable, breathing appears normal, mental status is A and O times 3  Assessment and Plan: See problem oriented charting  Follow Up Instructions: rx methocarbamol, meloxicam and tramadol to use prn for pain and counseled about likely timeline for improvement.  I discussed the assessment and treatment plan with the patient. The patient was provided an opportunity to ask questions and all were answered. The patient agreed with the plan and demonstrated an understanding of the instructions.   The patient was advised to call back or seek an in-person evaluation if the symptoms worsen or if the condition fails to improve as anticipated.  Myrlene Broker, MD

## 2023-05-04 NOTE — Assessment & Plan Note (Signed)
Happened with moving heavy objects due to roof leak. She has no red flag signs to suggest need for imaging today. Started 1 day ago. Work note done to return Monday with light duty and full duty 05/14/23. Rx tramadol and methocarbamol and meloxicam to use for pain and given expectations for improvement. Return or contact us if not improving or symptoms change.

## 2023-07-12 ENCOUNTER — Encounter: Payer: Self-pay | Admitting: Internal Medicine

## 2023-07-12 ENCOUNTER — Ambulatory Visit: Payer: BC Managed Care – PPO | Admitting: Internal Medicine

## 2023-07-12 VITALS — BP 100/84 | Ht 63.0 in | Wt 159.0 lb

## 2023-07-12 DIAGNOSIS — M357 Hypermobility syndrome: Secondary | ICD-10-CM | POA: Diagnosis not present

## 2023-07-12 DIAGNOSIS — R7303 Prediabetes: Secondary | ICD-10-CM

## 2023-07-12 LAB — COMPREHENSIVE METABOLIC PANEL
ALT: 25 U/L (ref 0–35)
AST: 28 U/L (ref 0–37)
Albumin: 4.3 g/dL (ref 3.5–5.2)
Alkaline Phosphatase: 70 U/L (ref 39–117)
BUN: 19 mg/dL (ref 6–23)
CO2: 32 meq/L (ref 19–32)
Calcium: 9.6 mg/dL (ref 8.4–10.5)
Chloride: 102 meq/L (ref 96–112)
Creatinine, Ser: 1 mg/dL (ref 0.40–1.20)
GFR: 64.57 mL/min (ref >60.00)
Glucose, Bld: 92 mg/dL (ref 70–99)
Potassium: 4 meq/L (ref 3.5–5.1)
Sodium: 141 meq/L (ref 135–145)
Total Bilirubin: 0.6 mg/dL (ref 0.2–1.2)
Total Protein: 7.4 g/dL (ref 6.0–8.3)

## 2023-07-12 LAB — HEMOGLOBIN A1C: Hgb A1c MFr Bld: 6 % (ref 4.6–6.5)

## 2023-07-12 LAB — CBC
HCT: 45.1 % (ref 36.0–46.0)
Hemoglobin: 14.7 g/dL (ref 12.0–15.0)
MCHC: 32.6 g/dL (ref 30.0–36.0)
MCV: 93 fL (ref 78.0–100.0)
Platelets: 243 10*3/uL (ref 150.0–400.0)
RBC: 4.85 Mil/uL (ref 3.87–5.11)
RDW: 12.9 % (ref 11.5–15.5)
WBC: 5.5 10*3/uL (ref 4.0–10.5)

## 2023-07-12 LAB — LIPID PANEL
Cholesterol: 197 mg/dL (ref 0–200)
HDL: 59.1 mg/dL (ref >39.00)
LDL Cholesterol: 128 mg/dL — ABNORMAL HIGH (ref 0–99)
NonHDL: 138.29
Total CHOL/HDL Ratio: 3
Triglycerides: 50 mg/dL (ref 0.0–149.0)
VLDL: 10 mg/dL (ref 0.0–40.0)

## 2023-07-12 MED ORDER — MELOXICAM 15 MG PO TABS: 15.0000 mg | ORAL_TABLET | Freq: Every day | ORAL | 0 refills | Status: DC

## 2023-07-12 NOTE — Assessment & Plan Note (Signed)
Needs follow up today as here and getting labs ordered HGA1c and lipid panel. Adjust as needed.

## 2023-07-12 NOTE — Assessment & Plan Note (Addendum)
Checking ANA panel and RF given new symptoms. They are not high risk for auto-immune. Rx meloxicam to take 15 mg daily for 1-2 weeks then as needed. Advised to start taking glucosamine and/or turmeric to help long term.

## 2023-07-12 NOTE — Patient Instructions (Signed)
We will do meloxicam daily for 1-2 weeks. We will check the labs today to rule out an auto-immune form of arthritis.   Start taking glucosamine and/or turmeric over the counter to help long term.

## 2023-07-12 NOTE — Progress Notes (Signed)
   Subjective:   Patient ID: Ebony Allen, female    DOB: Jan 31, 1971, 52 y.o.   MRN: [id number]  HPI The patient is a 52 YO female coming in for joint pains diffuse without swelling. Morning stiffness not more than 10 minutes.   Review of Systems  Constitutional: Negative.   HENT: Negative.    Eyes: Negative.   Respiratory:  Negative for cough, chest tightness and shortness of breath.   Cardiovascular:  Negative for chest pain, palpitations and leg swelling.  Gastrointestinal:  Negative for abdominal distention, abdominal pain, constipation, diarrhea, nausea and vomiting.  Musculoskeletal:  Positive for arthralgias and myalgias.  Skin: Negative.   Neurological: Negative.   Psychiatric/Behavioral: Negative.      Objective:  Physical Exam Constitutional:      Appearance: She is well-developed.  HENT:     Head: Normocephalic and atraumatic.  Cardiovascular:     Rate and Rhythm: Normal rate and regular rhythm.  Pulmonary:     Effort: Pulmonary effort is normal. No respiratory distress.     Breath sounds: Normal breath sounds. No wheezing or rales.  Abdominal:     General: Bowel sounds are normal. There is no distension.     Palpations: Abdomen is soft.     Tenderness: There is no abdominal tenderness. There is no rebound.  Musculoskeletal:        General: Tenderness present. No swelling or deformity.     Cervical back: Normal range of motion.     Right lower leg: No edema.     Left lower leg: No edema.  Skin:    General: Skin is warm and dry.  Neurological:     Mental Status: She is alert and oriented to person, place, and time.     Coordination: Coordination normal.     Vitals:   07/12/23 0845  BP: 100/84  TempSrc: Oral  Weight: 159 lb (72.1 kg)  Height: 5\' 3"  (1.6 m)    Assessment & Plan:

## 2023-07-13 LAB — ANA, IFA COMPREHENSIVE PANEL
Anti Nuclear Antibody (ANA): NEGATIVE
ENA SM Ab Ser-aCnc: <1 AI
SM/RNP: <1 AI
SSA (Ro) (ENA) Antibody, IgG: <1 AI
SSB (La) (ENA) Antibody, IgG: <1 AI
Scleroderma (Scl-70) (ENA) Antibody, IgG: <1 AI
ds DNA Ab: 1 [IU]/mL

## 2023-07-13 LAB — RHEUMATOID FACTOR: Rheumatoid fact SerPl-aCnc: <10 [IU]/mL (ref <14)

## 2024-02-19 ENCOUNTER — Ambulatory Visit
Admission: RE | Admit: 2024-02-19 | Discharge: 2024-02-19 | Disposition: A | Discharge status: Home / Self Care | Source: Ambulatory Visit | Attending: Nurse Practitioner | Admitting: Nurse Practitioner

## 2024-02-19 ENCOUNTER — Other Ambulatory Visit: Payer: Self-pay

## 2024-02-19 ENCOUNTER — Ambulatory Visit: Payer: Self-pay

## 2024-02-19 VITALS — BP 102/67 | HR 88 | Temp 97.9°F | Resp 16

## 2024-02-19 DIAGNOSIS — R197 Diarrhea, unspecified: Secondary | ICD-10-CM | POA: Diagnosis not present

## 2024-02-19 DIAGNOSIS — K529 Noninfective gastroenteritis and colitis, unspecified: Secondary | ICD-10-CM | POA: Diagnosis not present

## 2024-02-19 MED ORDER — DICYCLOMINE HCL 20 MG PO TABS: 20.0000 mg | ORAL_TABLET | Freq: Four times a day (QID) | ORAL | 0 refills | Status: AC | PRN

## 2024-02-19 NOTE — Telephone Encounter (Signed)
 Chief Complaint: diarrhea, abdominal pain Symptoms: Mild diarrhea, abdominal cramps, felt feverish yesterday Frequency: x 1 day Pertinent Negatives: Patient denies vomiting, nausea, blood in stool, foreign travel, exposure to infectious diarrhea, recent antibiotic use, dizziness Disposition: [] ED /[x] Urgent Care (no appt availability in office) / [] Appointment(In office/virtual)/ []  [facility]/ [] Home Care/ [] Refused Recommended Disposition /[] [facility]/ []  Follow-up with PCP Additional Notes: Offered patient soonest available appointments with [facility], tomorrow morning. Patient prefers to be seen today, scheduled with [facility].  Copied from CRM [id number]. Topic: Clinical - Medical Advice >> Feb 19, 2024  9:08 AM Rosaria Common wrote: Reason for CRM: Pt dealing with diarrhea and stomach cramps since 5/24 calling for medical advice. Warm transfer to nurse. Reason for Disposition  Abdominal pain  (Exception: Pain clears with each passage of diarrhea stool.)  Answer Assessment - Initial Assessment Questions 1. DIARRHEA SEVERITY: "How bad is the diarrhea?" "How many more stools have you had in the past 24 hours than normal?"    - NO DIARRHEA (SCALE 0)   - MILD (SCALE 1-3): Few loose or mushy BMs; increase of 1-3 stools over normal daily number of stools; mild increase in ostomy output.   -  MODERATE (SCALE 4-7): Increase of 4-6 stools daily over normal; moderate increase in ostomy output.   -  SEVERE (SCALE 8-10; OR "WORST POSSIBLE"): Increase of 7 or more stools daily over normal; moderate increase in ostomy output; incontinence.     5 episodes of diarrhea in the last 24 hours, normal BM is twice daily. (3 more than usual)  2. ONSET: "When did the diarrhea begin?"      Yesterday.  3. BM CONSISTENCY: "How loose or watery is the diarrhea?"      Some stool but very loose.  4. VOMITING: "Are you also vomiting?" If Yes, ask: "How many times in the  past 24 hours?"      No.  5. ABDOMEN PAIN: "Are you having any abdomen pain?" If Yes, ask: "What does it feel like?" (e.g., crampy, dull, intermittent, constant)      Yes, abdominal cramps. She states she used a heating pad last night that helped. Intermittent.  6. ABDOMEN PAIN SEVERITY: If present, ask: "How bad is the pain?"  (e.g., Scale 1-10; mild, moderate, or severe)   - MILD (1-3): doesn't interfere with normal activities, abdomen soft and not tender to touch    - MODERATE (4-7): interferes with normal activities or awakens from sleep, abdomen tender to touch    - SEVERE (8-10): excruciating pain, doubled over, unable to do any normal activities       None present now; when she does feel them 8/10.  7. ORAL INTAKE: If vomiting, "Have you been able to drink liquids?" "How much liquids have you had in the past 24 hours?"     Yes, she estimates she has drank about 32 oz of water .  8. HYDRATION: "Any signs of dehydration?" (e.g., dry mouth [not just dry lips], too weak to stand, dizziness, new weight loss) "When did you last urinate?"     No signs of dehydration. She states she has been trying to drink plenty of water . Last urination around 0100.  9. EXPOSURE: "Have you traveled to a foreign country recently?" "Have you been exposed to anyone with diarrhea?" "Could you have eaten any food that was spoiled?"     No.  10. ANTIBIOTIC USE: "Are you taking antibiotics now or have you taken  antibiotics in the past 2 months?"       No.  11. OTHER SYMPTOMS: "Do you have any other symptoms?" (e.g., fever, blood in stool)       Felt feverish but didn't check temperature.  12. PREGNANCY: "Is there any chance you are pregnant?" "When was your last menstrual period?"       No.  Protocols used: [facility]

## 2024-02-19 NOTE — ED Triage Notes (Signed)
 Pt c/o diarrhea and abdominal crampingx2d. Pt denies N/V

## 2024-02-19 NOTE — Discharge Instructions (Addendum)
 You were seen today for nausea and diarrhea, which are most likely caused by viral gastroenteritis, commonly referred to as the stomach flu. This condition can lead to sudden watery diarrhea, nausea, vomiting, and sometimes fever. These symptoms may result in dehydration, especially if you are not able to keep fluids down. Dehydration can cause symptoms like feeling weak or tired, having a dry mouth, feeling very thirsty, or noticing that you urinate less frequently. It is important to stay hydrated to support your recovery.  Make sure to drink enough fluids to keep your urine a light yellow color. You may begin eating again with a bland diet as tolerated. Try to eat small amounts of food every 3 to 4 hours and gradually increase as you feel better. Avoid sugary or caffeinated drinks, such as soda, energy drinks, and sports drinks. Also avoid dairy, alcohol, and any spicy, greasy, or fatty foods while you are recovering, as these can worsen your symptoms.  Seek emergency care right away if you experience chest pain, difficulty breathing or very rapid breathing, a fast heart rate, fainting, or extreme weakness. Go to the emergency room if you develop a severe headache, stiff neck, rash, confusion, or severe abdominal pain or cramping. Other signs that require immediate care include pain with urination, signs of dehydration like dark or very little urine, cracked lips, or dry mouth, or if you notice any signs of bleeding, such as blood in your vomit, vomit that looks like coffee grounds, or bloody or black tar-like stools.

## 2024-02-19 NOTE — ED Provider Notes (Signed)
 [facility]    CSN: [id number] Arrival date & time: 02/19/24  1829      History   Chief Complaint Chief Complaint  Patient presents with   Diarrhea    Entered by patient    HPI Ebony Allen is a 53 y.o. female.   Ebony Allen is a 53 y.o. female that presents with cramping and diarrhea that started on Saturday, Feb 16, 2024. The patient has a history of digestive problems, including acid reflux, and has had their esophagus stretched in the past. The diarrhea has been consistent and watery since onset. The patient denies any blood in the stool or black stools. They also deny any nausea or vomiting. The patient reports feeling hungry but lacking an appetite. The abdominal cramping was worse yesterday than today, indicating a possible improvement in symptoms. The patient ate out at [employer] on Friday and attended a repass after a funeral on Saturday, with symptoms starting after these events. They have been managing their symptoms by taking Pepto-Bismol and consuming a limited diet of saltine crackers, Lays potato chips, and water  with BCAA powder. The patient mentions avoiding coffee during this time.   Today's evaluation has revealed no signs of a dangerous process. Discussed diagnosis with patient and/or guardian. Patient and/or guardian aware of their diagnosis, possible red flag symptoms to watch out for and need for close follow up. Patient and/or guardian understands verbal and written discharge instructions. Patient and/or guardian comfortable with plan and disposition.  Patient and/or guardian has a clear mental status at this time, good insight into illness (after discussion and teaching) and has clear judgment to make decisions regarding their care  Documentation was completed with the aid of voice recognition software. Transcription may contain typographical errors.    Past Medical History:  Diagnosis Date   Anemia    GERD (gastroesophageal reflux disease)      Patient Active Problem List   Diagnosis Date Noted   Bilateral numbness and tingling of arms and legs 05/12/2022   Onychomycosis 01/26/2022   Urinary incontinence 01/26/2022   Pre-diabetes 01/26/2022   Headache 07/15/2020   Bilateral shoulder pain 07/15/2020   Upper back pain 07/15/2020   Hypermobility syndrome 01/03/2017   Low back pain with sciatica 09/11/2016   Routine general medical examination at a health care facility 01/11/2016   GERD (gastroesophageal reflux disease) 03/09/2014    Past Surgical History:  Procedure Laterality Date   DILATATION & CURETTAGE/HYSTEROSCOPY WITH MYOSURE N/A 11/17/2014   Procedure: DILATATION & CURETTAGE/HYSTEROSCOPY WITH MYOSURE;  Surgeon: Ona Bidding, MD;  Location: [facility];  Service: Gynecology;  Laterality: N/A;   DILITATION & CURRETTAGE/HYSTROSCOPY WITH VERSAPOINT RESECTION N/A 05/20/2015   Procedure: DILATATION & CURETTAGE/HYSTEROSCOPY WITH VERSAPOINT RESECTION;  Surgeon: Ona Bidding, MD;  Location: [facility];  Service: Gynecology;  Laterality: N/A;   esophageal dilation     Dr Ashok Laws   ROBOT ASSISTED MYOMECTOMY N/A 11/17/2014   Procedure: ROBOTIC ASSISTED MYOMECTOMY;  Surgeon: Ona Bidding, MD;  Location: [facility];  Service: Gynecology;  Laterality: N/A;    OB History   No obstetric history on file.      Home Medications    Prior to Admission medications   Medication Sig Start Date End Date Taking? Authorizing Provider  dicyclomine (BENTYL) 20 MG tablet Take 1 tablet (20 mg total) by mouth 4 (four) times daily as needed (abdominal cramping). 02/19/24  Yes Maryruth Sol, FNP  Ascorbic Acid (VITAMIN C PO) Take by mouth.    [provider]  BIOTIN PO Take by mouth.    [provider]  Cholecalciferol (VITAMIN D ) 2000 UNITS CAPS Take 1 capsule by mouth daily.    [provider]  ciclopirox  (PENLAC ) 8 % solution Apply topically at bedtime. Apply over nail and surrounding skin. Apply daily over previous coat.  After seven (7) days, may remove with alcohol and continue cycle. 05/10/22   Adelia Homestead, MD  Colloidal Oatmeal (AVEENO ECZEMA THERAPY) 1 % CREA See admin instructions. 05/17/17   [provider]  Cranberry 500 MG CHEW Chew 1 tablet by mouth 2 (two) times daily after a meal. 04/05/17   Nche, Connye Delaine, NP  IRON PO Take by mouth.    [provider]  Magnesium 100 MG CAPS  10/26/22   [provider]  Multiple Vitamins-Minerals (HAIR SKIN AND NAILS FORMULA PO) Hair,Skin and Nails    [provider]  Multiple Vitamins-Minerals (HAIR/SKIN/NAILS PO) Take 1 tablet by mouth daily.    [provider]  Omega-3 Fatty Acids (FISH OIL) 1000 MG CAPS Fish Oil    [provider]    Family History History reviewed. No pertinent family history.  Social History Social History   Tobacco Use   Smoking status: Never   Smokeless tobacco: Never  Substance Use Topics   Alcohol use: Yes    Comment:  socially   Drug use: No     Allergies   Latex and Nickel   Review of Systems Review of Systems  Constitutional:  Positive for appetite change (feels hungry but no appetite; scared to eat).  Gastrointestinal:  Positive for abdominal pain (cramping) and diarrhea. Negative for nausea and vomiting.     Physical Exam Triage Vital Signs ED Triage Vitals  Encounter Vitals Group     BP 02/19/24 1849 102/67     Systolic BP Percentile --      Diastolic BP Percentile --      Pulse Rate 02/19/24 1849 88     Resp 02/19/24 1849 16     Temp 02/19/24 1849 97.9 F (36.6 C)     Temp Source 02/19/24 1849 Oral     SpO2 02/19/24 1849 96 %     Weight --      Height --      Head Circumference --      Peak Flow --      Pain Score 02/19/24 1846 4     Pain Loc --      Pain Education --      Exclude from Growth Chart --    No data found.  Updated Vital Signs BP 102/67   Pulse 88   Temp 97.9 F (36.6 C) (Oral)   Resp 16   SpO2 96%   Visual  Acuity Right Eye Distance:   Left Eye Distance:   Bilateral Distance:    Right Eye Near:   Left Eye Near:    Bilateral Near:     Physical Exam Vitals reviewed.  Constitutional:      General: She is not in acute distress.    Appearance: Normal appearance. She is not toxic-appearing.  HENT:     Head: Normocephalic.     Mouth/Throat:     Mouth: Mucous membranes are moist.  Eyes:     Conjunctiva/sclera: Conjunctivae normal.  Cardiovascular:     Rate and Rhythm: Normal rate and regular rhythm.     Heart sounds: Normal heart sounds.  Pulmonary:     Effort: Pulmonary effort is normal.  Breath sounds: Normal breath sounds.  Musculoskeletal:        General: Normal range of motion.  Skin:    General: Skin is warm and dry.  Neurological:     General: No focal deficit present.     Mental Status: She is alert and oriented to person, place, and time.      UC Treatments / Results  Labs (all labs ordered are listed, but only abnormal results are displayed) Labs Reviewed - No data to display  EKG   Radiology No results found.  Procedures Procedures (including critical care time)  Medications Ordered in UC Medications - No data to display  Initial Impression / Assessment and Plan / UC Course  I have reviewed the triage vital signs and the nursing notes.  Pertinent labs & imaging results that were available during my care of the patient were reviewed by me and considered in my medical decision making (see chart for details).    Patient presents with cramping and watery diarrhea that began after eating out on Friday and Saturday, likely related to foodborne illness or viral gastroenteritis. No nausea, vomiting, or bloody stool reported. Symptoms improving slightly today. Patient has been managing with Pepto-Bismol and limited bland diet. Clinical presentation is consistent with mild gastroenteritis. Plan is to continue Pepto-Bismol as needed, prescribe Bentyl for abdominal  cramping, and encourage oral rehydration with Pedialyte. Patient advised to follow a bland diet and avoid irritants such as juice, dairy, and greasy foods.   Today's evaluation has revealed no signs of a dangerous process. Discussed diagnosis with patient and/or guardian. Patient and/or guardian aware of their diagnosis, possible red flag symptoms to watch out for and need for close follow up. Patient and/or guardian understands verbal and written discharge instructions. Patient and/or guardian comfortable with plan and disposition.  Patient and/or guardian has a clear mental status at this time, good insight into illness (after discussion and teaching) and has clear judgment to make decisions regarding their care  Documentation was completed with the aid of voice recognition software. Transcription may contain typographical errors.  Final Clinical Impressions(s) / UC Diagnoses   Final diagnoses:  Diarrhea, unspecified type  Noninfectious gastroenteritis, unspecified type     Discharge Instructions      You were seen today for nausea and diarrhea, which are most likely caused by viral gastroenteritis, commonly referred to as the stomach flu. This condition can lead to sudden watery diarrhea, nausea, vomiting, and sometimes fever. These symptoms may result in dehydration, especially if you are not able to keep fluids down. Dehydration can cause symptoms like feeling weak or tired, having a dry mouth, feeling very thirsty, or noticing that you urinate less frequently. It is important to stay hydrated to support your recovery.  Make sure to drink enough fluids to keep your urine a light yellow color. You may begin eating again with a bland diet as tolerated. Try to eat small amounts of food every 3 to 4 hours and gradually increase as you feel better. Avoid sugary or caffeinated drinks, such as soda, energy drinks, and sports drinks. Also avoid dairy, alcohol, and any spicy, greasy, or fatty foods  while you are recovering, as these can worsen your symptoms.  Seek emergency care right away if you experience chest pain, difficulty breathing or very rapid breathing, a fast heart rate, fainting, or extreme weakness. Go to the emergency room if you develop a severe headache, stiff neck, rash, confusion, or severe abdominal pain or cramping. Other  signs that require immediate care include pain with urination, signs of dehydration like dark or very little urine, cracked lips, or dry mouth, or if you notice any signs of bleeding, such as blood in your vomit, vomit that looks like coffee grounds, or bloody or black tar-like stools.   ED Prescriptions     Medication Sig Dispense Auth. Provider   dicyclomine (BENTYL) 20 MG tablet Take 1 tablet (20 mg total) by mouth 4 (four) times daily as needed (abdominal cramping). 12 tablet Maryruth Sol, FNP      PDMP not reviewed this encounter.   Maryruth Sol, [place] 02/19/24 2012

## 2024-06-10 NOTE — Progress Notes (Unsigned)
    Subjective:    Patient ID: Ebony Allen, female    DOB: May 25, 1971, 53 y.o.   MRN: [id number]      HPI Ebony Allen is here for No chief complaint on file.        Medications and allergies reviewed with patient and updated if appropriate.  Current Outpatient Medications on File Prior to Visit  Medication Sig Dispense Refill   Ascorbic Acid (VITAMIN C PO) Take by mouth.     BIOTIN PO Take by mouth.     Cholecalciferol (VITAMIN D ) 2000 UNITS CAPS Take 1 capsule by mouth daily.     ciclopirox  (PENLAC ) 8 % solution Apply topically at bedtime. Apply over nail and surrounding skin. Apply daily over previous coat. After seven (7) days, may remove with alcohol and continue cycle. 6.6 mL 1   Colloidal Oatmeal (AVEENO ECZEMA THERAPY) 1 % CREA See admin instructions.     Cranberry 500 MG CHEW Chew 1 tablet by mouth 2 (two) times daily after a meal. 30 tablet 0   dicyclomine  (BENTYL ) 20 MG tablet Take 1 tablet (20 mg total) by mouth 4 (four) times daily as needed (abdominal cramping). 12 tablet 0   IRON PO Take by mouth.     Magnesium 100 MG CAPS      Multiple Vitamins-Minerals (HAIR SKIN AND NAILS FORMULA PO) Hair,Skin and Nails     Multiple Vitamins-Minerals (HAIR/SKIN/NAILS PO) Take 1 tablet by mouth daily.     Omega-3 Fatty Acids (FISH OIL) 1000 MG CAPS Fish Oil     No current facility-administered medications on file prior to visit.    Review of Systems     Objective:  There were no vitals filed for this visit. BP Readings from Last 3 Encounters:  02/19/24 102/67  07/12/23 100/84  03/26/23 116/74   Wt Readings from Last 3 Encounters:  07/12/23 159 lb (72.1 kg)  03/26/23 157 lb (71.2 kg)  05/10/22 148 lb 9.6 oz (67.4 kg)   There is no height or weight on file to calculate BMI.    Physical Exam         Assessment & Plan:    See Problem List for Assessment and Plan of chronic medical problems.

## 2024-06-11 ENCOUNTER — Encounter: Payer: Self-pay | Admitting: Internal Medicine

## 2024-06-11 ENCOUNTER — Ambulatory Visit: Admitting: Internal Medicine

## 2024-06-11 VITALS — BP 118/84 | HR 50 | Temp 98.4°F | Wt 157.6 lb

## 2024-06-11 DIAGNOSIS — M5441 Lumbago with sciatica, right side: Secondary | ICD-10-CM | POA: Diagnosis not present

## 2024-06-11 MED ORDER — MELOXICAM 15 MG PO TABS: 15.0000 mg | ORAL_TABLET | Freq: Every day | ORAL | 0 refills | Status: AC

## 2024-06-11 MED ORDER — TRAMADOL HCL 50 MG PO TABS: 50.0000 mg | ORAL_TABLET | Freq: Three times a day (TID) | ORAL | 0 refills | Status: AC | PRN

## 2024-06-11 MED ORDER — METHOCARBAMOL 500 MG PO TABS: 500.0000 mg | ORAL_TABLET | Freq: Three times a day (TID) | ORAL | 0 refills | Status: AC | PRN

## 2024-06-11 MED ORDER — PREDNISONE 20 MG PO TABS: 40.0000 mg | ORAL_TABLET | Freq: Every day | ORAL | 0 refills | Status: AC

## 2024-06-11 NOTE — Patient Instructions (Addendum)
       Medications changes include :   prednisone  40 mg daily x 5 days - then meloxicam  as needed.  Methocarbamol  for muscle relaxant.  Tramadol  for pain       Return if symptoms worsen or fail to improve.

## 2024-06-15 ENCOUNTER — Other Ambulatory Visit: Payer: Self-pay | Admitting: Internal Medicine
